# Patient Record
Sex: Male | Born: 1955 | Race: White | Hispanic: No | Smoking: Current every day smoker
Health system: Southern US, Community
[De-identification: ages and names within clinical notes are randomized; demographics above are authoritative.]

## PROBLEM LIST (undated history)

## (undated) DIAGNOSIS — C4491 Basal cell carcinoma of skin, unspecified: Secondary | ICD-10-CM

## (undated) DIAGNOSIS — Z8669 Personal history of other diseases of the nervous system and sense organs: Secondary | ICD-10-CM

## (undated) DIAGNOSIS — I219 Acute myocardial infarction, unspecified: Secondary | ICD-10-CM

## (undated) DIAGNOSIS — I5020 Unspecified systolic (congestive) heart failure: Secondary | ICD-10-CM

## (undated) DIAGNOSIS — F17200 Nicotine dependence, unspecified, uncomplicated: Secondary | ICD-10-CM

## (undated) DIAGNOSIS — K219 Gastro-esophageal reflux disease without esophagitis: Secondary | ICD-10-CM

## (undated) DIAGNOSIS — I2510 Atherosclerotic heart disease of native coronary artery without angina pectoris: Secondary | ICD-10-CM

## (undated) DIAGNOSIS — K4090 Unilateral inguinal hernia, without obstruction or gangrene, not specified as recurrent: Secondary | ICD-10-CM

## (undated) DIAGNOSIS — E785 Hyperlipidemia, unspecified: Secondary | ICD-10-CM

## (undated) DIAGNOSIS — Z951 Presence of aortocoronary bypass graft: Secondary | ICD-10-CM

## (undated) DIAGNOSIS — E782 Mixed hyperlipidemia: Secondary | ICD-10-CM

## (undated) DIAGNOSIS — I519 Heart disease, unspecified: Secondary | ICD-10-CM

## (undated) DIAGNOSIS — M1990 Unspecified osteoarthritis, unspecified site: Secondary | ICD-10-CM

## (undated) DIAGNOSIS — Z85828 Personal history of other malignant neoplasm of skin: Secondary | ICD-10-CM

## (undated) DIAGNOSIS — Z8619 Personal history of other infectious and parasitic diseases: Secondary | ICD-10-CM

## (undated) DIAGNOSIS — I10 Essential (primary) hypertension: Secondary | ICD-10-CM

## (undated) HISTORY — DX: Gastro-esophageal reflux disease without esophagitis: K21.9

## (undated) HISTORY — DX: Acute myocardial infarction, unspecified: I21.9

## (undated) HISTORY — DX: Heart disease, unspecified: I51.9

## (undated) HISTORY — DX: Basal cell carcinoma of skin, unspecified: C44.91

## (undated) HISTORY — DX: Presence of aortocoronary bypass graft: Z95.1

## (undated) HISTORY — PX: CORONARY ARTERY BYPASS GRAFT: SHX141

## (undated) HISTORY — DX: Personal history of other infectious and parasitic diseases: Z86.19

## (undated) HISTORY — PX: LOBECTOMY: SHX5089

## (undated) HISTORY — DX: Unspecified osteoarthritis, unspecified site: M19.90

## (undated) HISTORY — DX: Personal history of other diseases of the nervous system and sense organs: Z86.69

## (undated) HISTORY — DX: Hyperlipidemia, unspecified: E78.5

## (undated) HISTORY — PX: OTHER SURGICAL HISTORY: SHX169

## (undated) HISTORY — DX: Unilateral inguinal hernia, without obstruction or gangrene, not specified as recurrent: K40.90

## (undated) HISTORY — PX: SKIN CANCER EXCISION: SHX779

---
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
## 2006-04-20 HISTORY — PX: INGUINAL HERNIA REPAIR: SUR1180

## 2018-06-08 ENCOUNTER — Encounter (INDEPENDENT_AMBULATORY_CARE_PROVIDER_SITE_OTHER): Payer: Self-pay

## 2018-06-08 ENCOUNTER — Encounter: Payer: Self-pay | Admitting: Family Medicine

## 2018-06-08 ENCOUNTER — Ambulatory Visit (INDEPENDENT_AMBULATORY_CARE_PROVIDER_SITE_OTHER): Payer: Medicare HMO | Admitting: Family Medicine

## 2018-06-08 VITALS — BP 118/72 | HR 61 | Temp 98.4°F | Resp 12 | Ht 67.25 in | Wt 207.2 lb

## 2018-06-08 DIAGNOSIS — I10 Essential (primary) hypertension: Secondary | ICD-10-CM

## 2018-06-08 DIAGNOSIS — Z1211 Encounter for screening for malignant neoplasm of colon: Secondary | ICD-10-CM | POA: Diagnosis not present

## 2018-06-08 DIAGNOSIS — Z85828 Personal history of other malignant neoplasm of skin: Secondary | ICD-10-CM

## 2018-06-08 DIAGNOSIS — E785 Hyperlipidemia, unspecified: Secondary | ICD-10-CM | POA: Insufficient documentation

## 2018-06-08 DIAGNOSIS — Z1159 Encounter for screening for other viral diseases: Secondary | ICD-10-CM | POA: Diagnosis not present

## 2018-06-08 DIAGNOSIS — F17200 Nicotine dependence, unspecified, uncomplicated: Secondary | ICD-10-CM

## 2018-06-08 DIAGNOSIS — Z23 Encounter for immunization: Secondary | ICD-10-CM | POA: Diagnosis not present

## 2018-06-08 DIAGNOSIS — Z951 Presence of aortocoronary bypass graft: Secondary | ICD-10-CM

## 2018-06-08 DIAGNOSIS — E782 Mixed hyperlipidemia: Secondary | ICD-10-CM

## 2018-06-08 DIAGNOSIS — F17201 Nicotine dependence, unspecified, in remission: Secondary | ICD-10-CM | POA: Insufficient documentation

## 2018-06-08 DIAGNOSIS — K219 Gastro-esophageal reflux disease without esophagitis: Secondary | ICD-10-CM

## 2018-06-08 DIAGNOSIS — R69 Illness, unspecified: Secondary | ICD-10-CM | POA: Diagnosis not present

## 2018-06-08 HISTORY — DX: Gastro-esophageal reflux disease without esophagitis: K21.9

## 2018-06-08 HISTORY — DX: Nicotine dependence, unspecified, uncomplicated: F17.200

## 2018-06-08 HISTORY — DX: Mixed hyperlipidemia: E78.2

## 2018-06-08 HISTORY — DX: Essential (primary) hypertension: I10

## 2018-06-08 HISTORY — DX: Personal history of other malignant neoplasm of skin: Z85.828

## 2018-06-08 MED ORDER — PRAVASTATIN SODIUM 40 MG PO TABS: 40.0000 mg | ORAL_TABLET | Freq: Every day | ORAL | 3 refills | Status: DC

## 2018-06-08 MED ORDER — OMEPRAZOLE 20 MG PO CPDR: 20.0000 mg | DELAYED_RELEASE_CAPSULE | Freq: Every day | ORAL | 1 refills | Status: DC

## 2018-06-08 MED ORDER — AMLODIPINE BESYLATE 5 MG PO TABS: 5.0000 mg | ORAL_TABLET | Freq: Every day | ORAL | 3 refills | Status: DC

## 2018-06-08 MED ORDER — TETANUS-DIPHTH-ACELL PERTUSSIS 5-2-15.5 LF-MCG/0.5 IM SUSP: 0.5000 mL | Freq: Once | INTRAMUSCULAR | 0 refills | Status: AC

## 2018-06-08 MED ORDER — CLOPIDOGREL BISULFATE 75 MG PO TABS: 75.0000 mg | ORAL_TABLET | Freq: Every day | ORAL | 1 refills | Status: DC

## 2018-06-08 MED ORDER — METOPROLOL TARTRATE 25 MG PO TABS: 25.0000 mg | ORAL_TABLET | Freq: Two times a day (BID) | ORAL | 3 refills | Status: DC

## 2018-06-08 MED ORDER — NITROGLYCERIN 0.4 MG SL SUBL: 0.4000 mg | SUBLINGUAL_TABLET | SUBLINGUAL | 0 refills | Status: DC | PRN

## 2018-06-08 NOTE — Assessment & Plan Note (Signed)
Will trial PPI to see if that improves symptoms. Given smoking/drinking/duration of symptoms if no improvement may need GI referral to further evaluate. Inconsistent dysphagia is somewhat reassuring and no weight loss.

## 2018-06-08 NOTE — Patient Instructions (Addendum)
#   Screening needs - colon cancer screening - referral  - hepatitis C screening  #Labs  - check with insurance for coverage - will get liver, kidney, electrolytes, lipids, blood counts  #Referrals - to dermatology and cardiology  #Belching - stop the Tagamet - start omeprazole daily - take on an empty stomach and then eat within 30-60 minutes

## 2018-06-08 NOTE — Assessment & Plan Note (Signed)
Reports 2 prior BCC removed and now with new lesion. Exam deferred today as feel he would benefit from full skin check with dermatology and due to time constraints.

## 2018-06-08 NOTE — Assessment & Plan Note (Signed)
Will obtain cardiology records. Cont current medications. BP stable

## 2018-06-08 NOTE — Assessment & Plan Note (Signed)
Not interested in quitting currently. Will continue to address. Anticipate discussion of lung cancer screening and exploring DOE in further detail during next visit. May need spirometry

## 2018-06-08 NOTE — Assessment & Plan Note (Signed)
Records. Cont current meds. Referral for additional management guidence

## 2018-06-08 NOTE — Progress Notes (Signed)
Subjective:     Brandon [name] is a 63 y.o. male presenting for Establish Care (previous PCP in 2005 in [place]. Has seen cardiologist in [facility] No providers in [place] that patient has seen.); Dysphagia (feels his food is not going down well when he eats and has been going on for a while. He does get some chest tightness with laying down but when he sits up and belches symptoms go away.); Skin check (has history of skin cancer and wants referral for dermatology Dr. Wilhemina Bonito with [facility]); and Heart Problem (patient needs cardiology referral to follow- Would like to see Larae Grooms)      HPI   Was waiting for medicare to start to get medication refilled  #Dysphagia - food not going down well - has been going on and off for food not going down, occasionally happens - belching and getting up in the night has been going since 2014  - occasional chest tightness when he lays down - belching improves symptoms and sitting improves symptoms - thinks it may be a hiatal hernia - feels like food is getting stuck and slowly moving through - hx of smoking, current smoking - used nitroglycerin a few times w/o improvement - told to double up on Tagamet by his cardiologist - symptoms primarily when laying down - no unexpected weight loss - no work-up due to insurance issues and poor follow-up   #Skin cancer - would like dermatology referral - 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   HENT: Positive for trouble swallowing. Negative for congestion.   Eyes: Negative.   Respiratory: Positive for shortness of breath. Negative for cough.   Cardiovascular: Negative for  chest pain, palpitations and leg swelling.  Gastrointestinal: Positive for abdominal pain. Negative for nausea and vomiting.  Endocrine: Negative.   Genitourinary: Negative.   Musculoskeletal: Negative.   Skin:       New skin lesion on leg  Allergic/Immunologic: Negative.   Neurological: Negative for headaches.  Hematological: Negative.   Psychiatric/Behavioral: Negative.      Social History   Tobacco Use  Smoking Status Current Every Day Smoker  . Packs/day: 1.50  . Years: 40.00  . Pack years: 60.00  . Types: Cigarettes  Smokeless Tobacco Former Systems developer  . Types: Chew  . Quit date: 04/21/2003        Objective:    BP Readings from Last 3 Encounters:  06/08/18 118/72   Wt Readings from Last 3 Encounters:  06/08/18 207 lb 4 oz (94 kg)    BP 118/72   Pulse 61   Temp 98.4 F (36.9 C)   Resp 12   Ht 5' 7.25" (1.708 m)   Wt 207 lb 4 oz (94 kg)   SpO2 94%   BMI 32.22 kg/m    Physical Exam Constitutional:      Appearance: Normal appearance. He is not ill-appearing or diaphoretic.  HENT:     Right Ear: External ear normal.     Left Ear: External ear normal.     Nose: Nose normal.  Eyes:     General: No scleral icterus.    Extraocular Movements: Extraocular movements intact.  Conjunctiva/sclera: Conjunctivae normal.  Neck:     Musculoskeletal: Neck supple.  Cardiovascular:     Rate and Rhythm: Normal rate and regular rhythm.     Heart sounds: No murmur.  Pulmonary:     Effort: Pulmonary effort is normal. No respiratory distress.     Breath sounds: Normal breath sounds. No wheezing.  Abdominal:     General: Abdomen is flat. Bowel sounds are normal. There is distension (air filled).     Palpations: Abdomen is soft. There is no mass.     Tenderness: There is no abdominal tenderness. There is no guarding or rebound.     Hernia: No hernia is present.  Skin:    General: Skin is warm and dry.  Neurological:     Mental Status: He is alert. Mental status is at  baseline.  Psychiatric:        Mood and Affect: Mood normal.           Assessment & Plan:   Problem List Items Addressed This Visit      Cardiovascular and Mediastinum   Essential hypertension    Will obtain cardiology records. Cont current medications. BP stable      Relevant Medications   metoprolol tartrate (LOPRESSOR) 25 MG tablet   amLODipine (NORVASC) 5 MG tablet   pravastatin (PRAVACHOL) 40 MG tablet   nitroGLYCERIN (NITROSTAT) 0.4 MG SL tablet   aspirin EC 81 MG tablet   Other Relevant Orders   Comprehensive metabolic panel   CBC   Ambulatory referral to Cardiology     Digestive   Gastroesophageal reflux disease - Primary    Will trial PPI to see if that improves symptoms. Given smoking/drinking/duration of symptoms if no improvement may need GI referral to further evaluate. Inconsistent dysphagia is somewhat reassuring and no weight loss.       Relevant Medications   omeprazole (PRILOSEC) 20 MG capsule     Musculoskeletal and Integument   Hx of basal cell carcinoma    Reports 2 prior BCC removed and now with new lesion. Exam deferred today as feel he would benefit from full skin check with dermatology and due to time constraints.       Relevant Medications   aspirin EC 81 MG tablet   Other Relevant Orders   Ambulatory referral to Dermatology     Other   Mixed hyperlipidemia   Relevant Medications   metoprolol tartrate (LOPRESSOR) 25 MG tablet   amLODipine (NORVASC) 5 MG tablet   pravastatin (PRAVACHOL) 40 MG tablet   nitroGLYCERIN (NITROSTAT) 0.4 MG SL tablet   aspirin EC 81 MG tablet   Other Relevant Orders   Comprehensive metabolic panel   Lipid panel   Ambulatory referral to Cardiology   Hx of CABG    Records. Cont current meds. Referral for additional management guidence      Relevant Medications   metoprolol tartrate (LOPRESSOR) 25 MG tablet   amLODipine (NORVASC) 5 MG tablet   pravastatin (PRAVACHOL) 40 MG tablet   nitroGLYCERIN  (NITROSTAT) 0.4 MG SL tablet   clopidogrel (PLAVIX) 75 MG tablet   Other Relevant Orders   Comprehensive metabolic panel   CBC   Ambulatory referral to Cardiology   Tobacco use disorder    Not interested in quitting currently. Will continue to address. Anticipate discussion of lung cancer screening and exploring DOE in further detail during next visit. May need spirometry       Other Visit Diagnoses    Colon cancer screening  Relevant Orders   Ambulatory referral to Gastroenterology   Need for hepatitis C screening test       Relevant Orders   Hepatitis C antibody       Return in about 4 weeks (around 07/06/2018) for reflux symptoms.  Lesleigh Noe, MD

## 2018-06-09 ENCOUNTER — Telehealth: Payer: Self-pay | Admitting: Family Medicine

## 2018-06-09 DIAGNOSIS — Z951 Presence of aortocoronary bypass graft: Secondary | ICD-10-CM

## 2018-06-09 DIAGNOSIS — E782 Mixed hyperlipidemia: Secondary | ICD-10-CM

## 2018-06-09 DIAGNOSIS — I10 Essential (primary) hypertension: Secondary | ICD-10-CM

## 2018-06-09 MED ORDER — PRAVASTATIN SODIUM 40 MG PO TABS: 40.0000 mg | ORAL_TABLET | Freq: Every day | ORAL | 3 refills | Status: DC

## 2018-06-09 MED ORDER — CLOPIDOGREL BISULFATE 75 MG PO TABS: 75.0000 mg | ORAL_TABLET | Freq: Every day | ORAL | 1 refills | Status: DC

## 2018-06-09 MED ORDER — METOPROLOL TARTRATE 25 MG PO TABS: 25.0000 mg | ORAL_TABLET | Freq: Two times a day (BID) | ORAL | 3 refills | Status: DC

## 2018-06-09 MED ORDER — NITROGLYCERIN 0.4 MG SL SUBL: 0.4000 mg | SUBLINGUAL_TABLET | SUBLINGUAL | 0 refills | Status: DC | PRN

## 2018-06-09 MED ORDER — OMEPRAZOLE 20 MG PO CPDR: 20.0000 mg | DELAYED_RELEASE_CAPSULE | Freq: Every day | ORAL | 1 refills | Status: DC

## 2018-06-09 MED ORDER — AMLODIPINE BESYLATE 5 MG PO TABS: 5.0000 mg | ORAL_TABLET | Freq: Every day | ORAL | 3 refills | Status: DC

## 2018-06-09 NOTE — Telephone Encounter (Signed)
RXs [facility] [place] for all the medications and patient advised.

## 2018-06-09 NOTE — Telephone Encounter (Signed)
Pt called stating that due to having medicare [facility] [place] will need to be his preferred pharmacy. [facility].

## 2018-06-10 ENCOUNTER — Telehealth: Payer: Self-pay | Admitting: Interventional Cardiology

## 2018-06-10 NOTE — Telephone Encounter (Signed)
Recv'd records from [place] forwarded 12 pages to Dr. Casandra Doffing

## 2018-06-14 ENCOUNTER — Encounter: Payer: Self-pay | Admitting: Family Medicine

## 2018-06-14 DIAGNOSIS — I5020 Unspecified systolic (congestive) heart failure: Secondary | ICD-10-CM | POA: Insufficient documentation

## 2018-06-14 DIAGNOSIS — I2510 Atherosclerotic heart disease of native coronary artery without angina pectoris: Secondary | ICD-10-CM

## 2018-06-14 HISTORY — DX: Unspecified systolic (congestive) heart failure: I50.20

## 2018-06-14 HISTORY — DX: Atherosclerotic heart disease of native coronary artery without angina pectoris: I25.10

## 2018-06-15 ENCOUNTER — Encounter: Payer: Self-pay | Admitting: Gastroenterology

## 2018-07-01 ENCOUNTER — Telehealth: Payer: Self-pay | Admitting: Family Medicine

## 2018-07-01 NOTE — Telephone Encounter (Signed)
Agree with postponing. Return if no improvement. Otherwise would recommend continuing PPI prescribed in office visit for a total of 8-12 weeks and then can stop if symptoms improved. If no improvement would recommend follow-up once it is safer to come to clinic.

## 2018-07-01 NOTE — Telephone Encounter (Signed)
Patient advised.

## 2018-07-01 NOTE — Telephone Encounter (Signed)
Pt called to postpone 4 wk follow up due to virus concerns. I advised him we are taking precautions but understand his hesitation. He said he will call back to r/s next week but asked for a call back if you do not agree with the cancellation. He is aware he may need to follow up before further refills.

## 2018-07-06 ENCOUNTER — Ambulatory Visit: Payer: Medicare HMO | Admitting: Family Medicine

## 2018-07-13 ENCOUNTER — Ambulatory Visit: Payer: Medicare HMO | Admitting: Gastroenterology

## 2018-08-04 ENCOUNTER — Ambulatory Visit: Payer: Medicare HMO | Admitting: Interventional Cardiology

## 2018-08-14 ENCOUNTER — Other Ambulatory Visit: Payer: Self-pay | Admitting: Family Medicine

## 2018-08-16 ENCOUNTER — Ambulatory Visit: Payer: Medicare HMO | Admitting: Gastroenterology

## 2018-09-03 DIAGNOSIS — S60552A Superficial foreign body of left hand, initial encounter: Secondary | ICD-10-CM | POA: Diagnosis not present

## 2018-09-03 DIAGNOSIS — X58XXXA Exposure to other specified factors, initial encounter: Secondary | ICD-10-CM | POA: Diagnosis not present

## 2018-09-03 DIAGNOSIS — G8911 Acute pain due to trauma: Secondary | ICD-10-CM | POA: Diagnosis not present

## 2018-09-03 DIAGNOSIS — S61442A Puncture wound with foreign body of left hand, initial encounter: Secondary | ICD-10-CM | POA: Diagnosis not present

## 2018-10-12 ENCOUNTER — Other Ambulatory Visit: Payer: Self-pay | Admitting: Family Medicine

## 2018-10-12 NOTE — Telephone Encounter (Signed)
Spoke with patient to see if he was still taking Omeprazole. He takes it daily and "loves it". Symptoms have improved, pretty much resolved. Ok to refill? Need follow up appointment?

## 2018-10-17 ENCOUNTER — Other Ambulatory Visit: Payer: Self-pay

## 2018-10-17 DIAGNOSIS — Z951 Presence of aortocoronary bypass graft: Secondary | ICD-10-CM

## 2018-10-17 NOTE — Telephone Encounter (Signed)
Seems appropriate to continue plavix until cardiology visit.   Please clarify what the issue is - I believe I received a notification from the pharmacy that he was not taking (likely due to not refilling appropriate). Please clarify how often he is taking the medication

## 2018-10-17 NOTE — Telephone Encounter (Signed)
Pt left v/m that they are wanting to stop plavix. Pt has been taking Plavix for 15 years and pt does not want to stop plavix until sees cardiology in August 2020.Please advise. I tried to call pt to see who they are that wants to stop plavix;unable to reach pt by phone. Last filled # 30 x 1 on 06/09/18.

## 2018-10-18 NOTE — Telephone Encounter (Signed)
Called both numbers in the chart and left a message to call back

## 2018-10-20 MED ORDER — CLOPIDOGREL BISULFATE 75 MG PO TABS: 75.0000 mg | ORAL_TABLET | Freq: Every day | ORAL | 1 refills | Status: DC

## 2018-10-20 NOTE — Telephone Encounter (Signed)
Spoke with patient. He said that the last Plavix bottle he received said 0 refills and he thought that meant Dr. Einar Pheasant did not want him to take it anymore. He said during last visit Dr. Einar Pheasant wanted to have patient come off this medication possibly. He gets 90 day supply for his medications and would like to get a refill on this for that amount please. Advised patient that Dr. Einar Pheasant said to continue medication unless cardiologist tells him other wise in August.

## 2018-10-20 NOTE — Telephone Encounter (Signed)
Called both numbers in the chart and patient's sister, ok per DPR on file and left a message for patient to call back

## 2018-10-20 NOTE — Addendum Note (Signed)
Addended by: Kris Mouton on: 10/20/2018 10:03 AM   Modules accepted: Orders

## 2018-11-24 DIAGNOSIS — C44719 Basal cell carcinoma of skin of left lower limb, including hip: Secondary | ICD-10-CM | POA: Diagnosis not present

## 2018-11-24 DIAGNOSIS — D1722 Benign lipomatous neoplasm of skin and subcutaneous tissue of left arm: Secondary | ICD-10-CM | POA: Diagnosis not present

## 2018-11-24 DIAGNOSIS — D225 Melanocytic nevi of trunk: Secondary | ICD-10-CM | POA: Diagnosis not present

## 2018-11-24 DIAGNOSIS — L814 Other melanin hyperpigmentation: Secondary | ICD-10-CM | POA: Diagnosis not present

## 2018-11-24 DIAGNOSIS — D485 Neoplasm of uncertain behavior of skin: Secondary | ICD-10-CM | POA: Diagnosis not present

## 2018-11-24 DIAGNOSIS — D1801 Hemangioma of skin and subcutaneous tissue: Secondary | ICD-10-CM | POA: Diagnosis not present

## 2018-11-24 DIAGNOSIS — L821 Other seborrheic keratosis: Secondary | ICD-10-CM | POA: Diagnosis not present

## 2018-12-08 ENCOUNTER — Other Ambulatory Visit: Payer: Self-pay | Admitting: Family Medicine

## 2018-12-13 ENCOUNTER — Ambulatory Visit (INDEPENDENT_AMBULATORY_CARE_PROVIDER_SITE_OTHER): Payer: Medicare HMO | Admitting: Interventional Cardiology

## 2018-12-13 ENCOUNTER — Other Ambulatory Visit: Payer: Self-pay

## 2018-12-13 ENCOUNTER — Encounter: Payer: Self-pay | Admitting: Interventional Cardiology

## 2018-12-13 VITALS — BP 116/70 | HR 72 | Ht 67.25 in | Wt 206.4 lb

## 2018-12-13 DIAGNOSIS — E782 Mixed hyperlipidemia: Secondary | ICD-10-CM

## 2018-12-13 DIAGNOSIS — I25118 Atherosclerotic heart disease of native coronary artery with other forms of angina pectoris: Secondary | ICD-10-CM

## 2018-12-13 DIAGNOSIS — I10 Essential (primary) hypertension: Secondary | ICD-10-CM

## 2018-12-13 DIAGNOSIS — Z951 Presence of aortocoronary bypass graft: Secondary | ICD-10-CM | POA: Diagnosis not present

## 2018-12-13 MED ORDER — METOPROLOL TARTRATE 25 MG PO TABS: 25.0000 mg | ORAL_TABLET | Freq: Two times a day (BID) | ORAL | 3 refills | Status: DC

## 2018-12-13 MED ORDER — NITROGLYCERIN 0.4 MG SL SUBL: 0.4000 mg | SUBLINGUAL_TABLET | SUBLINGUAL | 3 refills | Status: DC | PRN

## 2018-12-13 MED ORDER — PRAVASTATIN SODIUM 40 MG PO TABS: 40.0000 mg | ORAL_TABLET | Freq: Every day | ORAL | 3 refills | Status: DC

## 2018-12-13 MED ORDER — AMLODIPINE BESYLATE 5 MG PO TABS: 5.0000 mg | ORAL_TABLET | Freq: Every day | ORAL | 3 refills | Status: DC

## 2018-12-13 MED ORDER — CLOPIDOGREL BISULFATE 75 MG PO TABS: 75.0000 mg | ORAL_TABLET | Freq: Every day | ORAL | 3 refills | Status: DC

## 2018-12-13 MED ORDER — ASPIRIN EC 81 MG PO TBEC: 81.0000 mg | DELAYED_RELEASE_TABLET | Freq: Every day | ORAL | 3 refills | Status: DC

## 2018-12-13 NOTE — Patient Instructions (Signed)
Medication Instructions:  Your physician recommends that you continue on your current medications as directed. Please refer to the Current Medication list given to you today.  If you need a refill on your cardiac medications before your next appointment, please call your pharmacy.   Lab work: Your physician recommends that you return for FASTING lab work  (LIPIDS, CMET, CBC)   If you have labs (blood work) drawn today and your tests are completely normal, you will receive your results only by: Marland Kitchen MyChart Message (if you have MyChart) OR . A paper copy in the mail If you have any lab test that is abnormal or we need to change your treatment, we will call you to review the results.  Testing/Procedures: Your physician has requested that you have an abdominal aorta duplex. During this test, an ultrasound is used to evaluate the aorta. Allow 30 minutes for this exam. Do not eat after midnight the day before and avoid carbonated beverages  Follow-Up: At [facility], you and your health needs are our priority.  As part of our continuing mission to provide you with exceptional heart care, we have created designated Provider Care Teams.  These Care Teams include your primary Cardiologist (physician) and Advanced Practice Providers (APPs -  Physician Assistants and Nurse Practitioners) who all work together to provide you with the care you need, when you need it. . You will need a follow up appointment in 1 year.  Please call our office 2 months in advance to schedule this appointment.  You may see Casandra Doffing, MD or one of the following Advanced Practice Providers on your designated Care Team:   . Lyda Jester, PA-C . Dayna Dunn, PA-C . Ermalinda Barrios, PA-C  Any Other Special Instructions Will Be Listed Below (If Applicable).   Heart-Healthy Eating Plan Heart-healthy meal planning includes:  Eating less unhealthy fats.  Eating more healthy fats.  Making other changes in your diet.  Talk with your doctor or a diet specialist (dietitian) to create an eating plan that is right for you. What is my plan? Your doctor may recommend an eating plan that includes:  Total fat: ______% or less of total calories a day.  Saturated fat: ______% or less of total calories a day.  Cholesterol: less than _________mg a day. What are tips for following this plan? Cooking Avoid frying your food. Try to bake, boil, grill, or broil it instead. You can also reduce fat by:  Removing the skin from poultry.  Removing all visible fats from meats.  Steaming vegetables in water or broth. Meal planning   At meals, divide your plate into four equal parts: ? Fill one-half of your plate with vegetables and green salads. ? Fill one-fourth of your plate with whole grains. ? Fill one-fourth of your plate with lean protein foods.  Eat 4-5 servings of vegetables per day. A serving of vegetables is: ? 1 cup of raw or cooked vegetables. ? 2 cups of raw leafy greens.  Eat 4-5 servings of fruit per day. A serving of fruit is: ? 1 medium whole fruit. ?  cup of dried fruit. ?  cup of fresh, frozen, or canned fruit. ?  cup of 100% fruit juice.  Eat more foods that have soluble fiber. These are apples, broccoli, carrots, beans, peas, and barley. Try to get 20-30 g of fiber per day.  Eat 4-5 servings of nuts, legumes, and seeds per week: ? 1 serving of dried beans or legumes equals  cup  after being cooked. ? 1 serving of nuts is  cup. ? 1 serving of seeds equals 1 tablespoon. General information  Eat more home-cooked food. Eat less restaurant, buffet, and fast food.  Limit or avoid alcohol.  Limit foods that are high in starch and sugar.  Avoid fried foods.  Lose weight if you are overweight.  Keep track of how much salt (sodium) you eat. This is important if you have high blood pressure. Ask your doctor to tell you more about this.  Try to add vegetarian meals each week. Fats   Choose healthy fats. These include olive oil and canola oil, flaxseeds, walnuts, almonds, and seeds.  Eat more omega-3 fats. These include salmon, mackerel, sardines, tuna, flaxseed oil, and ground flaxseeds. Try to eat fish at least 2 times each week.  Check food labels. Avoid foods with trans fats or high amounts of saturated fat.  Limit saturated fats. ? These are often found in animal products, such as meats, butter, and cream. ? These are also found in plant foods, such as palm oil, palm kernel oil, and coconut oil.  Avoid foods with partially hydrogenated oils in them. These have trans fats. Examples are stick margarine, some tub margarines, cookies, crackers, and other baked goods. What foods can I eat? Fruits All fresh, canned (in natural juice), or frozen fruits. Vegetables Fresh or frozen vegetables (raw, steamed, roasted, or grilled). Green salads. Grains Most grains. Choose whole wheat and whole grains most of the time. Rice and pasta, including brown rice and pastas made with whole wheat. Meats and other proteins Lean, well-trimmed beef, veal, pork, and lamb. Chicken and [place] without skin. All fish and shellfish. Wild duck, rabbit, pheasant, and venison. Egg whites or low-cholesterol egg substitutes. Dried beans, peas, lentils, and tofu. Seeds and most nuts. Dairy Low-fat or nonfat cheeses, including ricotta and mozzarella. Skim or 1% milk that is liquid, powdered, or evaporated. Buttermilk that is made with low-fat milk. Nonfat or low-fat yogurt. Fats and oils Non-hydrogenated (trans-free) margarines. Vegetable oils, including soybean, sesame, sunflower, olive, peanut, safflower, corn, canola, and cottonseed. Salad dressings or mayonnaise made with a vegetable oil. Beverages Mineral water. Coffee and tea. Diet carbonated beverages. Sweets and desserts Sherbet, gelatin, and fruit ice. Small amounts of dark chocolate. Limit all sweets and desserts. Seasonings and condiments  All seasonings and condiments. The items listed above may not be a complete list of foods and drinks you can eat. Contact a dietitian for more options. What foods should I avoid? Fruits Canned fruit in heavy syrup. Fruit in cream or butter sauce. Fried fruit. Limit coconut. Vegetables Vegetables cooked in cheese, cream, or butter sauce. Fried vegetables. Grains Breads that are made with saturated or trans fats, oils, or whole milk. Croissants. Sweet rolls. Donuts. High-fat crackers, such as cheese crackers. Meats and other proteins Fatty meats, such as hot dogs, ribs, sausage, bacon, rib-eye roast or steak. High-fat deli meats, such as salami and bologna. Caviar. Domestic duck and goose. Organ meats, such as liver. Dairy Cream, sour cream, cream cheese, and creamed cottage cheese. Whole-milk cheeses. Whole or 2% milk that is liquid, evaporated, or condensed. Whole buttermilk. Cream sauce or high-fat cheese sauce. Yogurt that is made from whole milk. Fats and oils Meat fat, or shortening. Cocoa butter, hydrogenated oils, palm oil, coconut oil, palm kernel oil. Solid fats and shortenings, including bacon fat, salt pork, lard, and butter. Nondairy cream substitutes. Salad dressings with cheese or sour cream. Beverages Regular sodas and juice drinks  with added sugar. Sweets and desserts Frosting. Pudding. Cookies. Cakes. Pies. Milk chocolate or white chocolate. Buttered syrups. Full-fat ice cream or ice cream drinks. The items listed above may not be a complete list of foods and drinks to avoid. Contact a dietitian for more information. Summary  Heart-healthy meal planning includes eating less unhealthy fats, eating more healthy fats, and making other changes in your diet.  Eat a balanced diet. This includes fruits and vegetables, low-fat or nonfat dairy, lean protein, nuts and legumes, whole grains, and heart-healthy oils and fats. This information is not intended to replace advice given to  you by your health care provider. Make sure you discuss any questions you have with your health care provider. Document Released: 10/06/2011 Document Revised: 06/10/2017 Document Reviewed: 05/14/2017 Elsevier Patient Education  2020 [employer].

## 2018-12-13 NOTE — Progress Notes (Signed)
Cardiology Office Note   Date:  12/13/2018   ID:  Brandon [name], DOB February 01, 1956, MRN [id number]  PCP:  Lesleigh Noe, MD    No chief complaint on file.  CAD  Wt Readings from Last 3 Encounters:  12/13/18 206 lb 6.4 oz (93.6 kg)  06/08/18 207 lb 4 oz (94 kg)       History of Present Illness: Brandon [name] is a 63 y.o. male who is being seen today for the evaluation of hypertension, CAD at the request of Lesleigh Noe, MD.  In 2000, angina was back pain but then went to this left arm.  He had an MI at that time and later had CABG.  CAD history: CABG x 3 in 2000, [place] , [place]. [facility] 2005: Stent at [facility].  Angina felt like intermittent back pain.  2015: Stent at [facility], 2.25 x 16 Promus in Radial to RCA.  He has been on Plavix in the past. His PMD tried to stop his Plavix, but he preferred to stay on it.  Now lives in [place], [place].    Denies : Chest pain. Dizziness. Leg edema. Nitroglycerin use. Orthopnea. Palpitations. Paroxysmal nocturnal dyspnea. Shortness of breath. Syncope.   He has not been walking a lot. He continues to smoke.  COVID has limited his exercise and promoted smoking. He wants to try OTC smoking cessation tools.    He is unemployed.  He is not following a particularly healthy diet.      Past Medical History:  Diagnosis Date  . Arthritis   . Basal cell carcinoma   . CAD (coronary artery disease) 06/14/2018   Cath on 01/23/2014 with LAD occlusion  . Essential hypertension 06/08/2018  . Gastroesophageal reflux disease 06/08/2018  . GERD (gastroesophageal reflux disease)   . Heart disease   . Heart failure with reduced ejection fraction ([facility]) 06/14/2018   ECHO 01/22/2014 with EF 40-45%  . History of chicken pox   . History of migraine   . Hx of basal cell carcinoma 06/08/2018   04/2017. Left jaw  . Hx of CABG   . Hyperlipidemia   . Inguinal hernia   . Mixed hyperlipidemia 06/08/2018  . Tobacco use disorder 06/08/2018    Past  Surgical History:  Procedure Laterality Date  . APPENDECTOMY  1960  . CORONARY ARTERY BYPASS GRAFT     x 3  . INGUINAL HERNIA REPAIR  2008  . SKIN CANCER EXCISION    . Stent placed     in 2005 and in 2014     Current Outpatient Medications  Medication Sig Dispense Refill  . amLODipine (NORVASC) 5 MG tablet Take 1 tablet (5 mg total) by mouth daily. 90 tablet 3  . clopidogrel (PLAVIX) 75 MG tablet Take 1 tablet (75 mg total) by mouth daily. 90 tablet 3  . metoprolol tartrate (LOPRESSOR) 25 MG tablet Take 1 tablet (25 mg total) by mouth 2 (two) times daily. 180 tablet 3  . Multiple Vitamin (MULTIVITAMIN) tablet Take 1 tablet by mouth daily.    . nitroGLYCERIN (NITROSTAT) 0.4 MG SL tablet Place 1 tablet (0.4 mg total) under the tongue every 5 (five) minutes as needed for chest pain. 25 tablet 3  . omeprazole (PRILOSEC) 20 MG capsule Take 1 capsule (20 mg total) by mouth daily. Please call to make follow up appointment in the fall 90 capsule 0  . pravastatin (PRAVACHOL) 40 MG tablet Take 1 tablet (40 mg total) by mouth daily. [place]  tablet 3  . aspirin EC 81 MG tablet Take 1 tablet (81 mg total) by mouth daily. 90 tablet 3   No current facility-administered medications for this visit.     Allergies:   Antihistamines, loratadine-type; Penicillins; Shellfish allergy; and Iodine    Social History:  The patient  reports that he has been smoking cigarettes. He has a 60.00 pack-year smoking history. He quit smokeless tobacco use about 15 years ago.  His smokeless tobacco use included chew. He reports current alcohol use. He reports current drug use. Drug: Marijuana.   Family History:  The patient's family history includes Colon cancer (age of onset: 10) in his father; Colon polyps in his sister; Dementia in his mother; Diabetes in his father; Heart attack in his maternal grandfather and maternal grandmother; Heart attack (age of onset: 61) in his mother; Heart attack (age of onset: 64) in his  brother; Heart disease in his mother; Prostate cancer in his paternal grandfather; Stroke (age of onset: 54) in his paternal grandmother.    ROS:  Please see the history of present illness.   Otherwise, review of systems are positive for easy bruising.   All other systems are reviewed and negative.    PHYSICAL EXAM: VS:  BP 116/70   Pulse 72   Ht 5' 7.25" (1.708 m)   Wt 206 lb 6.4 oz (93.6 kg)   SpO2 95%   BMI 32.09 kg/m  , BMI Body mass index is 32.09 kg/m. GEN: Well nourished, well developed, in no acute distress  HEENT: normal  Neck: no JVD, carotid bruits, or masses Cardiac: RRR; no murmurs, rubs, or gallops,no edema  Respiratory:  clear to auscultation bilaterally, normal work of breathing GI: soft, nontender, nondistended, + BS MS: no deformity or atrophy  Skin: warm and dry, no rash Neuro:  Strength and sensation are intact Psych: euthymic mood, full affect   EKG:   The ekg ordered today demonstrates NSR, inferior Q waves, no ST segment   Recent Labs: No results found for requested labs within last 8760 hours.   Lipid Panel No results found for: CHOL, TRIG, HDL, CHOLHDL, VLDL, LDLCALC, LDLDIRECT   Other studies Reviewed: Additional studies/ records that were reviewed today with results demonstrating: prior notes reviewed .   ASSESSMENT AND PLAN:  1. CAD: s/p CABG: Decrease aspirin to 81 mg daily.  Continue Plavix.  Two PCI since CABG.  Continue aggressive secondary prevention. 2. HTN: The current medical regimen is effective;  continue present plan and medications. 3. Tobacco abuse: He prefers to try OTC aids to help stop smoking.  Will screen for AAA given smoking history and age.  4. Info re: heart healthy diet was given as well.    Current medicines are reviewed at length with the patient today.  The patient concerns regarding his medicines were addressed.  The following changes have been made:  No change  Labs/ tests ordered today include:   Orders  Placed This Encounter  Procedures  . US AORTA MEDICARE SCREENING  . Lipid panel  . Comprehensive metabolic panel  . CBC  . EKG 12-Lead    Recommend 150 minutes/week of aerobic exercise Low fat, low carb, high fiber diet recommended  Disposition:   FU in 1 year   Signed, Larae Grooms, MD  12/13/2018 2:30 PM    [address] [place] [place], [place], [place]  [address] Phone: [phone]; Fax: [phone]

## 2018-12-21 ENCOUNTER — Other Ambulatory Visit: Payer: Medicare HMO | Admitting: *Deleted

## 2018-12-21 ENCOUNTER — Other Ambulatory Visit: Payer: Self-pay

## 2018-12-21 DIAGNOSIS — I25118 Atherosclerotic heart disease of native coronary artery with other forms of angina pectoris: Secondary | ICD-10-CM | POA: Diagnosis not present

## 2018-12-21 DIAGNOSIS — Z951 Presence of aortocoronary bypass graft: Secondary | ICD-10-CM | POA: Diagnosis not present

## 2018-12-21 DIAGNOSIS — E782 Mixed hyperlipidemia: Secondary | ICD-10-CM | POA: Diagnosis not present

## 2018-12-21 DIAGNOSIS — I10 Essential (primary) hypertension: Secondary | ICD-10-CM | POA: Diagnosis not present

## 2018-12-21 LAB — COMPREHENSIVE METABOLIC PANEL
ALT: 20 IU/L (ref 0–44)
AST: 15 IU/L (ref 0–40)
Albumin/Globulin Ratio: 2.3 — ABNORMAL HIGH (ref 1.2–2.2)
Albumin: 4.3 g/dL (ref 3.8–4.8)
Alkaline Phosphatase: 56 IU/L (ref 39–117)
BUN/Creatinine Ratio: 17 (ref 10–24)
BUN: 15 mg/dL (ref 8–27)
Bilirubin Total: 0.4 mg/dL (ref 0.0–1.2)
CO2: 23 mmol/L (ref 20–29)
Calcium: 9.1 mg/dL (ref 8.6–10.2)
Chloride: 104 mmol/L (ref 96–106)
Creatinine, Ser: 0.9 mg/dL (ref 0.76–1.27)
GFR calc Af Amer: 103 mL/min/{1.73_m2} (ref >59)
GFR calc non Af Amer: 89 mL/min/{1.73_m2} (ref >59)
Globulin, Total: 1.9 g/dL (ref 1.5–4.5)
Glucose: 122 mg/dL — ABNORMAL HIGH (ref 65–99)
Potassium: 4.5 mmol/L (ref 3.5–5.2)
Sodium: 141 mmol/L (ref 134–144)
Total Protein: 6.2 g/dL (ref 6.0–8.5)

## 2018-12-21 LAB — CBC
Hematocrit: 46.6 % (ref 37.5–51.0)
Hemoglobin: 15.7 g/dL (ref 13.0–17.7)
MCH: 30.7 pg (ref 26.6–33.0)
MCHC: 33.7 g/dL (ref 31.5–35.7)
MCV: 91 fL (ref 79–97)
Platelets: 196 10*3/uL (ref 150–450)
RBC: 5.11 x10E6/uL (ref 4.14–5.80)
RDW: 13 % (ref 11.6–15.4)
WBC: 6.4 10*3/uL (ref 3.4–10.8)

## 2018-12-21 LAB — LIPID PANEL
Chol/HDL Ratio: 5.3 ratio — ABNORMAL HIGH (ref 0.0–5.0)
Cholesterol, Total: 190 mg/dL (ref 100–199)
HDL: 36 mg/dL — ABNORMAL LOW (ref >39)
LDL Chol Calc (NIH): 112 mg/dL — ABNORMAL HIGH (ref 0–99)
Triglycerides: 243 mg/dL — ABNORMAL HIGH (ref 0–149)
VLDL Cholesterol Cal: 42 mg/dL — ABNORMAL HIGH (ref 5–40)

## 2018-12-27 ENCOUNTER — Other Ambulatory Visit (HOSPITAL_COMMUNITY): Payer: Medicare HMO

## 2018-12-28 ENCOUNTER — Other Ambulatory Visit: Payer: Self-pay

## 2018-12-28 DIAGNOSIS — E782 Mixed hyperlipidemia: Secondary | ICD-10-CM

## 2018-12-28 MED ORDER — ATORVASTATIN CALCIUM 40 MG PO TABS: 40.0000 mg | ORAL_TABLET | Freq: Every day | ORAL | 3 refills | Status: DC

## 2019-01-05 ENCOUNTER — Other Ambulatory Visit: Payer: Self-pay | Admitting: *Deleted

## 2019-01-05 ENCOUNTER — Telehealth: Payer: Self-pay

## 2019-01-05 DIAGNOSIS — Z20822 Contact with and (suspected) exposure to covid-19: Secondary | ICD-10-CM

## 2019-01-05 DIAGNOSIS — R6889 Other general symptoms and signs: Secondary | ICD-10-CM | POA: Diagnosis not present

## 2019-01-05 NOTE — Telephone Encounter (Signed)
Agreed, thanks, please put him on the follow up list.

## 2019-01-05 NOTE — Telephone Encounter (Signed)
Pt wants to get covid testing mainly to protect family members pt might be around. Pt woke up on 01/04/19 with mild S/T on one side; today pt has scratchy throat on both sides of throat and pt feels like he is going to get a H/A but does not really get a true H/A. Pt has no covid symptoms except slight S/T and slight H/A,no fever. pt traveled to [place] in [place] 12/21/18 -12/27/18; pt did not wear mask at [place] or when in boat; pt was either by himself or with his sister and brother in law and no known exposure to + covid. Pt visits his mom at nursing home every other day thru the door but pt does see other family members. Pt wants covid testing to make sure he is not infecting his family.pt will try to self quarantine until gets covid test back. Pt is going to [facility] main visitor entrance now to get tested. FYI to Dr Damita Dunnings.

## 2019-01-06 LAB — NOVEL CORONAVIRUS, NAA: SARS-CoV-2, NAA: NOT DETECTED

## 2019-01-06 NOTE — Telephone Encounter (Signed)
See incomplete note below.  covid neg, please let me know if there is any outstanding issue.  Thanks.

## 2019-01-06 NOTE — Telephone Encounter (Signed)
Called patient and advised him of results and he verbalized understanding.  Patient stated that he is feeling much better today and does not have a sore throat or headache. Patient stated that he did not think that he had Covid, but wanted to make sure because he helps take care of some of his family members.

## 2019-01-06 NOTE — Telephone Encounter (Signed)
Patient's COVID results are in and negative. Will forward to [employer], LPN to have her contact patient.  FYI to Dr. Damita Dunnings.

## 2019-01-06 NOTE — Telephone Encounter (Signed)
Noted. Thanks.

## 2019-02-15 DIAGNOSIS — R69 Illness, unspecified: Secondary | ICD-10-CM | POA: Diagnosis not present

## 2019-03-06 ENCOUNTER — Other Ambulatory Visit: Payer: Self-pay | Admitting: Family Medicine

## 2019-03-06 NOTE — Telephone Encounter (Signed)
Would recommend follow-up appointment. If symptoms not improved did want to discuss consideration for GI referral.

## 2019-03-06 NOTE — Telephone Encounter (Signed)
Does patient need to follow up at a certain time?

## 2019-03-07 NOTE — Telephone Encounter (Signed)
Spoke with patient. He did not have a calendar with his schedule with him at this time and will call back to schedule. Appointment in December is fine.

## 2019-03-29 ENCOUNTER — Other Ambulatory Visit: Payer: Self-pay

## 2019-03-29 ENCOUNTER — Other Ambulatory Visit: Payer: Medicare HMO | Admitting: *Deleted

## 2019-03-29 ENCOUNTER — Encounter (INDEPENDENT_AMBULATORY_CARE_PROVIDER_SITE_OTHER): Payer: Self-pay

## 2019-03-29 DIAGNOSIS — E782 Mixed hyperlipidemia: Secondary | ICD-10-CM | POA: Diagnosis not present

## 2019-03-29 LAB — HEPATIC FUNCTION PANEL
ALT: 24 IU/L (ref 0–44)
AST: 17 IU/L (ref 0–40)
Albumin: 4.6 g/dL (ref 3.8–4.8)
Alkaline Phosphatase: 63 IU/L (ref 39–117)
Bilirubin Total: 0.7 mg/dL (ref 0.0–1.2)
Bilirubin, Direct: 0.16 mg/dL (ref 0.00–0.40)
Total Protein: 6.6 g/dL (ref 6.0–8.5)

## 2019-03-29 LAB — LIPID PANEL
Chol/HDL Ratio: 4.5 ratio (ref 0.0–5.0)
Cholesterol, Total: 159 mg/dL (ref 100–199)
HDL: 35 mg/dL — ABNORMAL LOW (ref >39)
LDL Chol Calc (NIH): 78 mg/dL (ref 0–99)
Triglycerides: 280 mg/dL — ABNORMAL HIGH (ref 0–149)
VLDL Cholesterol Cal: 46 mg/dL — ABNORMAL HIGH (ref 5–40)

## 2019-04-03 ENCOUNTER — Telehealth: Payer: Self-pay | Admitting: Interventional Cardiology

## 2019-04-03 ENCOUNTER — Other Ambulatory Visit: Payer: Self-pay

## 2019-04-03 DIAGNOSIS — E782 Mixed hyperlipidemia: Secondary | ICD-10-CM

## 2019-04-03 NOTE — Telephone Encounter (Signed)
The patient has been notified of the result and recommendations. Fasting labs scheduled for 10/02/19. Patient verbalized understanding.  All questions (if any) were answered. Cleon Gustin, RN 04/03/2019 3:19 PM

## 2019-04-03 NOTE — Telephone Encounter (Signed)
-----   Message from Jettie Booze, MD sent at 03/31/2019  6:41 PM EST ----- LDL better.  TG elevated.  Increase exercise and try to eat healthy.  Increase fiber in diet.  Repeat lipids and liver in 6 months.

## 2019-04-03 NOTE — Telephone Encounter (Signed)
Follow Up:; ° ° °Returning your call. °

## 2019-04-17 ENCOUNTER — Encounter: Payer: Self-pay | Admitting: Family Medicine

## 2019-04-17 ENCOUNTER — Ambulatory Visit: Payer: Medicare HMO | Admitting: Family Medicine

## 2019-04-17 ENCOUNTER — Telehealth: Payer: Self-pay

## 2019-04-17 ENCOUNTER — Ambulatory Visit (INDEPENDENT_AMBULATORY_CARE_PROVIDER_SITE_OTHER): Payer: Medicare HMO | Admitting: Family Medicine

## 2019-04-17 VITALS — Temp 99.8°F

## 2019-04-17 DIAGNOSIS — Z20828 Contact with and (suspected) exposure to other viral communicable diseases: Secondary | ICD-10-CM

## 2019-04-17 DIAGNOSIS — R197 Diarrhea, unspecified: Secondary | ICD-10-CM | POA: Diagnosis not present

## 2019-04-17 DIAGNOSIS — Z20822 Contact with and (suspected) exposure to covid-19: Secondary | ICD-10-CM

## 2019-04-17 NOTE — Telephone Encounter (Signed)
See encounter from today.

## 2019-04-17 NOTE — Telephone Encounter (Signed)
Pt had fever 97.5 - 101.5 for 5 days; now temp is 99.8 taking tylenol for fever; not sure if covid symptoms or stomach virus. Pt has had chills,abdominal pain on 04/15/19 with cramping feeling; diarrhea that is watery since 04/11/19. Last watery diarrhea was 12/27/020 at noon. Pt still having loose stools. Averaging 8-10 watery stools daily since 04/11/19.on 04/12/19 & 04/13/19 diarrhea appeared black. No travel and no known exposure to + covid. Pt has had dry mouth but heats with gas and that dries out pts mouth. Last urinated around 8 AM.no dizziness and urine is light yellow.pt has been self quarantining. Pt scheduled virtual appt today 04/17/19 at 10:40.

## 2019-04-17 NOTE — Progress Notes (Signed)
I connected with Brandon [name] on 04/17/19 at 10:40 AM EST by video and verified that I am speaking with the correct person using two identifiers.   I discussed the limitations, risks, security and privacy concerns of performing an evaluation and management service by video and the availability of in person appointments. I also discussed with the patient that there may be a patient responsible charge related to this service. The patient expressed understanding and agreed to proceed.  Patient location: Home Provider Location: [facility] Participants: Lesleigh Noe and Brandon [name]   Subjective:     Brandon [name] is a 63 y.o. male presenting for Diarrhea (fever for 5 days, chills. all started on 04/11/2019)     Diarrhea  This is a new problem. The current episode started in the past 7 days. The problem occurs more than 10 times per day. The problem has been gradually worsening. The stool consistency is described as watery. The patient states that diarrhea awakens him from sleep. Associated symptoms include abdominal pain (started yesterday), chills, a fever (tmax 101) and sweats. Pertinent negatives include no arthralgias, coughing, headaches, myalgias, URI or vomiting. There are no known risk factors. He has tried analgesics and increased fluids (ginger ale and water) for the symptoms. The treatment provided no relief.   No loss of taste/smell Low appetite Sunday - firming up - now having 5-6 BM daily  Urinating - several times a day Mouth is a little dry - heats with gas - only notices it at night, keeps a bottle of water nearby   Review of Systems  Constitutional: Positive for chills and fever (tmax 101).  Respiratory: Negative for cough.   Gastrointestinal: Positive for abdominal pain (started yesterday) and diarrhea. Negative for vomiting.  Musculoskeletal: Negative for arthralgias and myalgias.  Neurological: Negative for headaches.     Social History    Tobacco Use  Smoking Status Current Every Day Smoker  . Packs/day: 1.50  . Years: 40.00  . Pack years: 60.00  . Types: Cigarettes  Smokeless Tobacco Former Systems developer  . Types: Chew  . Quit date: 04/21/2003        Objective:   BP Readings from Last 3 Encounters:  12/13/18 116/70  06/08/18 118/72   Wt Readings from Last 3 Encounters:  12/13/18 206 lb 6.4 oz (93.6 kg)  06/08/18 207 lb 4 oz (94 kg)    Temp 99.8 F (37.7 C) Comment: per patient   Physical Exam Constitutional:      Appearance: Normal appearance. He is not ill-appearing.  HENT:     Head: Normocephalic and atraumatic.     Right Ear: External ear normal.     Left Ear: External ear normal.  Eyes:     Conjunctiva/sclera: Conjunctivae normal.  Pulmonary:     Effort: Pulmonary effort is normal. No respiratory distress.  Neurological:     Mental Status: He is alert. Mental status is at baseline.  Psychiatric:        Mood and Affect: Mood normal.        Behavior: Behavior normal.        Thought Content: Thought content normal.        Judgment: Judgment normal.            Assessment & Plan:   Problem List Items Addressed This Visit    None    Visit Diagnoses    Diarrhea of presumed infectious origin    -  Primary   Suspected COVID-19  virus infection         Discussed OTC treatment for diarrhea Given symptoms possible covid-19 and would recommend being tested ER precautions given Advised staying out of work and isolating until results have come back Information for contacting [facility] for scheduled testing provided  Advised call back if negative covid and symptoms do not resolve by next week for stool studies    Return if symptoms worsen or fail to improve.  Lesleigh Noe, MD

## 2019-04-18 ENCOUNTER — Ambulatory Visit: Payer: Medicare HMO | Attending: Internal Medicine

## 2019-04-18 DIAGNOSIS — Z20822 Contact with and (suspected) exposure to covid-19: Secondary | ICD-10-CM

## 2019-04-18 DIAGNOSIS — Z20828 Contact with and (suspected) exposure to other viral communicable diseases: Secondary | ICD-10-CM | POA: Diagnosis not present

## 2019-04-19 LAB — NOVEL CORONAVIRUS, NAA: SARS-CoV-2, NAA: NOT DETECTED

## 2019-05-30 ENCOUNTER — Other Ambulatory Visit: Payer: Self-pay | Admitting: Family Medicine

## 2019-05-30 NOTE — Telephone Encounter (Signed)
Please scheduled patient for follow up when possible in the next 2 to 3 months. Thank you

## 2019-05-31 NOTE — Telephone Encounter (Signed)
Called patient to schedule. He states he will be going out of town a few times in the next couple months and he will call back to schedule appt.

## 2019-06-15 ENCOUNTER — Ambulatory Visit (INDEPENDENT_AMBULATORY_CARE_PROVIDER_SITE_OTHER): Payer: Medicare HMO | Admitting: Family Medicine

## 2019-06-15 ENCOUNTER — Other Ambulatory Visit: Payer: Self-pay

## 2019-06-15 ENCOUNTER — Encounter: Payer: Self-pay | Admitting: Family Medicine

## 2019-06-15 VITALS — BP 108/58 | HR 66 | Temp 97.8°F | Ht 67.25 in | Wt 214.0 lb

## 2019-06-15 DIAGNOSIS — F17200 Nicotine dependence, unspecified, uncomplicated: Secondary | ICD-10-CM | POA: Diagnosis not present

## 2019-06-15 DIAGNOSIS — Z951 Presence of aortocoronary bypass graft: Secondary | ICD-10-CM | POA: Diagnosis not present

## 2019-06-15 DIAGNOSIS — I10 Essential (primary) hypertension: Secondary | ICD-10-CM | POA: Diagnosis not present

## 2019-06-15 DIAGNOSIS — M79672 Pain in left foot: Secondary | ICD-10-CM | POA: Diagnosis not present

## 2019-06-15 DIAGNOSIS — K219 Gastro-esophageal reflux disease without esophagitis: Secondary | ICD-10-CM | POA: Diagnosis not present

## 2019-06-15 DIAGNOSIS — R69 Illness, unspecified: Secondary | ICD-10-CM | POA: Diagnosis not present

## 2019-06-15 DIAGNOSIS — M79671 Pain in right foot: Secondary | ICD-10-CM | POA: Diagnosis not present

## 2019-06-15 MED ORDER — OMEPRAZOLE 20 MG PO CPDR: 20.0000 mg | DELAYED_RELEASE_CAPSULE | Freq: Every day | ORAL | 3 refills | Status: DC

## 2019-06-15 NOTE — Assessment & Plan Note (Signed)
1 month w/o cigarette. He will consider lung cancer screening - declined today due to financial issues

## 2019-06-15 NOTE — Assessment & Plan Note (Addendum)
Reflux much better, however, immediate return of symptoms when not taking medication. Given persistence of symptoms, discussed possible GI evaluation to make sure nothing more serious. Financially cannot do this now, but he will let me know when he is able. Cont omeprazole

## 2019-06-15 NOTE — Assessment & Plan Note (Signed)
Follows with cardiology. Continue plavix and asa

## 2019-06-15 NOTE — Patient Instructions (Addendum)
#   Continue taking the omperazole   When you get a job call or MyChart and I will place referrals for the following:   1) Would recommend colon cancer screening and potentially discussing reflux with the the GI  2) Would also recommend lung cancer screening   Dx: Elevated blood glucose Test: Hemoglobin A1c  Alternative option: Fasting glucose

## 2019-06-15 NOTE — Assessment & Plan Note (Signed)
BP at goal, continue medication

## 2019-06-15 NOTE — Assessment & Plan Note (Signed)
Etiology unclear. Notes some pins and needles. Normal exam. Offered trial of gabapentin - declined does not want another medication. Offered glucose check - he will check with insurance for cost. Continue to monitor. Stretches at night.

## 2019-06-15 NOTE — Progress Notes (Signed)
Subjective:     Brandon [name] is a 64 y.o. male presenting for Gastroesophageal Reflux (Omeprazole "is great". Does not have epigastric or chest pain. Very happy with med.)     HPI   #GERD - needs refill of omeprazole - in the morning will occasionally have urgent BM - feeling good - stopped for 2 days and had immediate return of symptoms - when taking the medication he does really well   #hx of CABG - still taking plavix and on ASA - seeing cardiology  - change statin medication  #Tobacco use - has quit for 1 full month now! - did stop for 3 months but slide back - no breathing issues  Financially - unemployeed  - needs to get colonoscopy    Review of Systems   Social History   Tobacco Use  Smoking Status Former Smoker  . Packs/day: 1.50  . Years: 40.00  . Pack years: 60.00  . Types: Cigarettes  . Quit date: 04/21/2019  . Years since quitting: 0.1  Smokeless Tobacco Former Systems developer  . Types: Chew  . Quit date: 04/21/2003        Objective:    BP Readings from Last 3 Encounters:  06/15/19 (!) 108/58  12/13/18 116/70  06/08/18 118/72   Wt Readings from Last 3 Encounters:  06/15/19 214 lb (97.1 kg)  12/13/18 206 lb 6.4 oz (93.6 kg)  06/08/18 207 lb 4 oz (94 kg)    BP (!) 108/58 (BP Location: Left Arm, Patient Position: Sitting, Cuff Size: Large)   Pulse 66   Temp 97.8 F (36.6 C)   Ht 5' 7.25" (1.708 m)   Wt 214 lb (97.1 kg)   SpO2 97%   BMI 33.27 kg/m    Physical Exam Constitutional:      Appearance: Normal appearance. He is not ill-appearing or diaphoretic.  HENT:     Right Ear: External ear normal.     Left Ear: External ear normal.     Nose: Nose normal.  Eyes:     General: No scleral icterus.    Extraocular Movements: Extraocular movements intact.     Conjunctiva/sclera: Conjunctivae normal.  Cardiovascular:     Rate and Rhythm: Normal rate and regular rhythm.     Heart sounds: Murmur present.  Pulmonary:     Effort: Pulmonary  effort is normal. No respiratory distress.     Breath sounds: Normal breath sounds. No wheezing.  Musculoskeletal:     Cervical back: Neck supple.     Comments: Bilateral foot Inspection: no lesions Palpation: no ttp including the ball of foot ROM: normal Strength: normal Normal monofilament testing Normal pulses  Skin:    General: Skin is warm and dry.  Neurological:     Mental Status: He is alert. Mental status is at baseline.  Psychiatric:        Mood and Affect: Mood normal.           Assessment & Plan:   Problem List Items Addressed This Visit      Cardiovascular and Mediastinum   Essential hypertension - Primary    BP at goal, continue medication        Digestive   Gastroesophageal reflux disease    Reflux much better, however, immediate return of symptoms when not taking medication. Given persistence of symptoms, discussed possible GI evaluation to make sure nothing more serious. Financially cannot do this now, but he will let me know when he is able. Cont omeprazole  Relevant Medications   omeprazole (PRILOSEC) 20 MG capsule     Other   Hx of CABG    Follows with cardiology. Continue plavix and asa      Tobacco use disorder    1 month w/o cigarette. He will consider lung cancer screening - declined today due to financial issues      Foot pain, bilateral    Etiology unclear. Notes some pins and needles. Normal exam. Offered trial of gabapentin - declined does not want another medication. Offered glucose check - he will check with insurance for cost. Continue to monitor. Stretches at night.           Return in about 6 months (around 12/13/2019).  Lesleigh Noe, MD

## 2019-06-16 ENCOUNTER — Ambulatory Visit: Payer: Medicare HMO | Attending: Internal Medicine

## 2019-06-16 DIAGNOSIS — Z23 Encounter for immunization: Secondary | ICD-10-CM | POA: Insufficient documentation

## 2019-06-16 NOTE — Progress Notes (Signed)
   Covid-19 Vaccination Clinic  Name:  Chanan Detwiler    MRN: [id number] DOB: 03-28-1956  06/16/2019  Mr. Milosevic was observed post Covid-19 immunization for 15 minutes without incidence. He was provided with Vaccine Information Sheet and instruction to access the V-Safe system.   Mr. Groh was instructed to call 911 with any severe reactions post vaccine: Marland Kitchen Difficulty breathing  . Swelling of your face and throat  . A fast heartbeat  . A bad rash all over your body  . Dizziness and weakness    Immunizations Administered    Name Date Dose VIS Date Route   Pfizer COVID-19 Vaccine 06/16/2019  9:12 AM 0.3 mL 03/31/2019 Intramuscular   Manufacturer: [place]   Lot: [id number]   [place]: [id number]

## 2019-07-12 ENCOUNTER — Ambulatory Visit: Payer: Medicare HMO | Attending: Internal Medicine

## 2019-07-12 DIAGNOSIS — Z23 Encounter for immunization: Secondary | ICD-10-CM

## 2019-07-12 NOTE — Progress Notes (Signed)
   Covid-19 Vaccination Clinic  Name:  Daris Harkins    MRN: [id number] DOB: [birth year]  07/12/2019  Mr. Matheson was observed post Covid-19 immunization for 15 minutes without incident. He was provided with Vaccine Information Sheet and instruction to access the V-Safe system.   Mr. Noyola was instructed to call 911 with any severe reactions post vaccine: Marland Kitchen Difficulty breathing  . Swelling of face and throat  . A fast heartbeat  . A bad rash all over body  . Dizziness and weakness   Immunizations Administered    Name Date Dose VIS Date Route   Pfizer COVID-19 Vaccine 07/12/2019 10:46 AM 0.3 mL 03/31/2019 Intramuscular   Manufacturer: [employer]   Lot: [id number]   [place]: [id number]

## 2019-10-02 ENCOUNTER — Other Ambulatory Visit: Payer: Medicare HMO

## 2019-10-05 ENCOUNTER — Other Ambulatory Visit: Payer: Medicare HMO

## 2019-11-27 DIAGNOSIS — L814 Other melanin hyperpigmentation: Secondary | ICD-10-CM | POA: Diagnosis not present

## 2019-11-27 DIAGNOSIS — D1801 Hemangioma of skin and subcutaneous tissue: Secondary | ICD-10-CM | POA: Diagnosis not present

## 2019-11-27 DIAGNOSIS — C44319 Basal cell carcinoma of skin of other parts of face: Secondary | ICD-10-CM | POA: Diagnosis not present

## 2019-11-27 DIAGNOSIS — L821 Other seborrheic keratosis: Secondary | ICD-10-CM | POA: Diagnosis not present

## 2019-11-27 DIAGNOSIS — L281 Prurigo nodularis: Secondary | ICD-10-CM | POA: Diagnosis not present

## 2019-11-27 DIAGNOSIS — D485 Neoplasm of uncertain behavior of skin: Secondary | ICD-10-CM | POA: Diagnosis not present

## 2019-11-27 DIAGNOSIS — D171 Benign lipomatous neoplasm of skin and subcutaneous tissue of trunk: Secondary | ICD-10-CM | POA: Diagnosis not present

## 2019-12-11 ENCOUNTER — Other Ambulatory Visit: Payer: Self-pay | Admitting: Interventional Cardiology

## 2019-12-27 ENCOUNTER — Other Ambulatory Visit: Payer: Medicare HMO | Admitting: *Deleted

## 2019-12-27 ENCOUNTER — Other Ambulatory Visit: Payer: Self-pay

## 2019-12-27 DIAGNOSIS — E782 Mixed hyperlipidemia: Secondary | ICD-10-CM

## 2019-12-27 LAB — HEPATIC FUNCTION PANEL
ALT: 18 IU/L (ref 0–44)
AST: 17 IU/L (ref 0–40)
Albumin: 4.4 g/dL (ref 3.8–4.8)
Alkaline Phosphatase: 54 IU/L (ref 48–121)
Bilirubin Total: 0.7 mg/dL (ref 0.0–1.2)
Bilirubin, Direct: 0.15 mg/dL (ref 0.00–0.40)
Total Protein: 6.5 g/dL (ref 6.0–8.5)

## 2019-12-27 LAB — LIPID PANEL
Chol/HDL Ratio: 5 ratio (ref 0.0–5.0)
Cholesterol, Total: 151 mg/dL (ref 100–199)
HDL: 30 mg/dL — ABNORMAL LOW (ref >39)
LDL Chol Calc (NIH): 78 mg/dL (ref 0–99)
Triglycerides: 259 mg/dL — ABNORMAL HIGH (ref 0–149)
VLDL Cholesterol Cal: 43 mg/dL — ABNORMAL HIGH (ref 5–40)

## 2019-12-29 ENCOUNTER — Other Ambulatory Visit: Payer: Self-pay

## 2019-12-29 MED ORDER — EZETIMIBE 10 MG PO TABS: 10.0000 mg | ORAL_TABLET | Freq: Every day | ORAL | 0 refills | Status: DC

## 2020-01-14 ENCOUNTER — Other Ambulatory Visit: Payer: Self-pay | Admitting: Interventional Cardiology

## 2020-02-06 NOTE — Progress Notes (Signed)
Cardiology Office Note   Date:  02/07/2020   ID:  Brandon [name], DOB 1956/01/23, MRN [id number]  PCP:  Lesleigh Noe, MD    No chief complaint on file.  CAD  Wt Readings from Last 3 Encounters:  02/07/20 211 lb (95.7 kg)  06/15/19 214 lb (97.1 kg)  12/13/18 206 lb 6.4 oz (93.6 kg)       History of Present Illness: Brandon [name] is a 64 y.o. male  With a history of hypertension, CAD.  In 2000, angina was back pain but then went to this left arm.  He had an MI at that time and later had CABG, LIMA and 2 radial arteries.  CAD history: CABG x 3 in 2000, [place] , [place]. [place] [facility] 2005: Stent at [facility].  Angina felt like intermittent back pain.  2015: Stent at [facility], 2.25 x 16 Promus in Radial to RCA.  He has been on Plavix in the past. His PMD tried to stop his Plavix, but he preferred to stay on it.  Now lives in [place], [place].  He has been unable to quit smoking over the years.   Since the last visit, he has done well.  Denies : Chest pain. Dizziness. Leg edema. Nitroglycerin use. Orthopnea. Palpitations. Paroxysmal nocturnal dyspnea. Shortness of breath. Syncope.   Not walking much.  He is doing more as the weather cools.  He smokes intermittently- also an e-cigarette as well.   He got his COVID vaccines.  No problems.     Past Medical History:  Diagnosis Date  . Arthritis   . Basal cell carcinoma   . CAD (coronary artery disease) 06/14/2018   Cath on 01/23/2014 with LAD occlusion  . Essential hypertension 06/08/2018  . Gastroesophageal reflux disease 06/08/2018  . GERD (gastroesophageal reflux disease)   . Heart disease   . Heart failure with reduced ejection fraction ([place]) 06/14/2018   ECHO 01/22/2014 with EF 40-45%  . History of chicken pox   . History of migraine   . Hx of basal cell carcinoma 06/08/2018   04/2017. Left jaw  . Hx of CABG   . Hyperlipidemia   . Inguinal hernia   . Mixed hyperlipidemia 06/08/2018  . Tobacco use disorder  06/08/2018    Past Surgical History:  Procedure Laterality Date  . APPENDECTOMY  1960  . CORONARY ARTERY BYPASS GRAFT     x 3  . INGUINAL HERNIA REPAIR  2008  . SKIN CANCER EXCISION    . Stent placed     in 2005 and in 2014     Current Outpatient Medications  Medication Sig Dispense Refill  . amLODipine (NORVASC) 5 MG tablet Take 1 tablet (5 mg total) by mouth daily. 90 tablet 3  . aspirin EC 81 MG tablet Take 1 tablet (81 mg total) by mouth daily. 90 tablet 3  . atorvastatin (LIPITOR) 40 MG tablet TAKE 1 (40 MG TOTAL) DAILY. MUST SCHEDULE FOLLOW UP APPT FOR FURTHER REFILLS. [id number] 30 tablet 0  . clopidogrel (PLAVIX) 75 MG tablet Take 1 tablet (75 mg total) by mouth daily. 90 tablet 3  . metoprolol tartrate (LOPRESSOR) 25 MG tablet Take 1 tablet (25 mg total) by mouth 2 (two) times daily. 180 tablet 3  . Multiple Vitamin (MULTIVITAMIN) tablet Take 1 tablet by mouth daily.    . nitroGLYCERIN (NITROSTAT) 0.4 MG SL tablet Place 1 tablet (0.4 mg total) under the tongue every 5 (five) minutes as needed for chest pain. 25 tablet  3  . omeprazole (PRILOSEC) 20 MG capsule Take 1 capsule (20 mg total) by mouth daily. 90 capsule 3   No current facility-administered medications for this visit.    Allergies:   Antihistamines, loratadine-type; Penicillins; Shellfish allergy; and Iodine    Social History:  The patient  reports that he quit smoking about 9 months ago. His smoking use included cigarettes. He has a 60.00 pack-year smoking history. He quit smokeless tobacco use about 16 years ago.  His smokeless tobacco use included chew. He reports current alcohol use. He reports current drug use. Drug: Marijuana.   Family History:  The patient's family history includes Colon cancer (age of onset: 44) in his father; Colon polyps in his sister; Dementia in his mother; Diabetes in his father; Heart attack in his maternal grandfather and maternal grandmother; Heart attack (age of onset: 46) in his  mother; Heart attack (age of onset: 22) in his brother; Heart disease in his mother; Prostate cancer in his paternal grandfather; Stroke (age of onset: 60) in his paternal grandmother.    ROS:  Please see the history of present illness.   Otherwise, review of systems are positive for controlled BPs at home; some dietary indiscretion.   All other systems are reviewed and negative.    PHYSICAL EXAM: VS:  BP 134/68   Pulse (!) 55   Ht 5' 7.25" (1.708 m)   Wt 211 lb (95.7 kg)   SpO2 99%   BMI 32.80 kg/m  , BMI Body mass index is 32.8 kg/m. GEN: Well nourished, well developed, in no acute distress  HEENT: normal  Neck: no JVD, carotid bruits, or masses Cardiac: RRR; no murmurs, rubs, or gallops,no edema  Respiratory:  clear to auscultation bilaterally, normal work of breathing GI: soft, nontender, nondistended, + BS MS: no deformity or atrophy  Skin: warm and dry, no rash Neuro:  Strength and sensation are intact Psych: euthymic mood, full affect   EKG:   The ekg ordered today demonstrates NSR, nonspecific ST changes   Recent Labs: 12/27/2019: ALT 18   Lipid Panel    Component Value Date/Time   CHOL 151 12/27/2019 0922   TRIG 259 (H) 12/27/2019 0922   HDL 30 (L) 12/27/2019 0922   CHOLHDL 5.0 12/27/2019 0922   LDLCALC 78 12/27/2019 0922     Other studies Reviewed: Additional studies/ records that were reviewed today with results demonstrating: LDL 78 in 9/21.   ASSESSMENT AND PLAN:  1. CAD: s/p CABG.  Continue aggressive secondary prevention.  Healthy diet recommended.  No sx at all of any post supper GERD sx.  These sx have concerned him for heart problems in the past.  He feels much better with OTC prilosec.   2. HTN: The current medical regimen is effective;  continue present plan and medications. 3. Tobacco abuse: using e-cig to stop smoking.  He knows he should try to stop using this as well. Discussed patch but he declined. 4. Hyperlipidemia:  Zetia was too  expensive.  Will switch atorvastatin to rosuvastatin 40 mg given that the TG are mildly elevated.   If Crestor is expensive, will go back to atorvastatin.    Current medicines are reviewed at length with the patient today.  The patient concerns regarding his medicines were addressed.  The following changes have been made:  No change  Labs/ tests ordered today include:  No orders of the defined types were placed in this encounter.   Recommend 150 minutes/week of aerobic exercise Low  fat, low carb, high fiber diet recommended  Disposition:   FU in 1 year   Signed, Larae Grooms, MD  02/07/2020 10:29 AM    [facility] [address], [place], [place]  [address] Phone: [phone]; Fax: [phone]

## 2020-02-07 ENCOUNTER — Other Ambulatory Visit: Payer: Self-pay

## 2020-02-07 ENCOUNTER — Ambulatory Visit: Payer: Medicare HMO | Admitting: Interventional Cardiology

## 2020-02-07 ENCOUNTER — Encounter: Payer: Self-pay | Admitting: Interventional Cardiology

## 2020-02-07 VITALS — BP 134/68 | HR 55 | Ht 67.25 in | Wt 211.0 lb

## 2020-02-07 DIAGNOSIS — Z951 Presence of aortocoronary bypass graft: Secondary | ICD-10-CM | POA: Diagnosis not present

## 2020-02-07 DIAGNOSIS — I25118 Atherosclerotic heart disease of native coronary artery with other forms of angina pectoris: Secondary | ICD-10-CM

## 2020-02-07 DIAGNOSIS — E782 Mixed hyperlipidemia: Secondary | ICD-10-CM | POA: Diagnosis not present

## 2020-02-07 DIAGNOSIS — I10 Essential (primary) hypertension: Secondary | ICD-10-CM | POA: Diagnosis not present

## 2020-02-07 MED ORDER — ROSUVASTATIN CALCIUM 40 MG PO TABS: 40.0000 mg | ORAL_TABLET | Freq: Every day | ORAL | 3 refills | Status: DC

## 2020-02-07 NOTE — Patient Instructions (Addendum)
Medication Instructions:  Your physician has recommended you make the following change in your medication: Stop Atorvastatin. Start Rosuvastatin 40 mg by mouth daily.   *If you need a refill on your cardiac medications before your next appointment, please call your pharmacy*   Lab Work: Your physician recommends that you return for lab work in: 3 months.  Scheduled for January 20,2022.  This will be fasting.  The lab opens at 7:30 AM  If you have labs (blood work) drawn today and your tests are completely normal, you will receive your results only by: Marland Kitchen MyChart Message (if you have MyChart) OR . A paper copy in the mail If you have any lab test that is abnormal or we need to change your treatment, we will call you to review the results.   Testing/Procedures: none   Follow-Up: At [facility], you and your health needs are our priority.  As part of our continuing mission to provide you with exceptional heart care, we have created designated Provider Care Teams.  These Care Teams include your primary Cardiologist (physician) and Advanced Practice Providers (APPs -  Physician Assistants and Nurse Practitioners) who all work together to provide you with the care you need, when you need it.  We recommend signing up for the patient portal called "MyChart".  Sign up information is provided on this After Visit Summary.  MyChart is used to connect with patients for Virtual Visits (Telemedicine).  Patients are able to view lab/test results, encounter notes, upcoming appointments, etc.  Non-urgent messages can be sent to your provider as well.   To learn more about what you can do with MyChart, go to [email or web address].    Your next appointment:   1 year(s)  The format for your next appointment:   In Person  Provider:   You may see Dr Irish Lack or one of the following Advanced Practice Providers on your designated Care Team:    Melina Copa, PA-C  Ermalinda Barrios, PA-C    Other  Instructions  High-Fiber Diet Fiber, also called dietary fiber, is a type of carbohydrate that is found in fruits, vegetables, whole grains, and beans. A high-fiber diet can have many health benefits. Your health care provider may recommend a high-fiber diet to help:  Prevent constipation. Fiber can make your bowel movements more regular.  Lower your cholesterol.  Relieve the following conditions: ? Swelling of veins in the anus (hemorrhoids). ? Swelling and irritation (inflammation) of specific areas of the digestive tract (uncomplicated diverticulosis). ? A problem of the large intestine (colon) that sometimes causes pain and diarrhea (irritable bowel syndrome, IBS).  Prevent overeating as part of a weight-loss plan.  Prevent heart disease, type 2 diabetes, and certain cancers. What is my plan? The recommended daily fiber intake in grams (g) includes:  38 g for men age 51 or younger.  30 g for men over age 8.  53 g for women age 61 or younger.  21 g for women over age 65. You can get the recommended daily intake of dietary fiber by:  Eating a variety of fruits, vegetables, grains, and beans.  Taking a fiber supplement, if it is not possible to get enough fiber through your diet. What do I need to know about a high-fiber diet?  It is better to get fiber through food sources rather than from fiber supplements. There is not a lot of research about how effective supplements are.  Always check the fiber content on the nutrition facts label of  any prepackaged food. Look for foods that contain 5 g of fiber or more per serving.  Talk with a diet and nutrition specialist (dietitian) if you have questions about specific foods that are recommended or not recommended for your medical condition, especially if those foods are not listed below.  Gradually increase how much fiber you consume. If you increase your intake of dietary fiber too quickly, you may have bloating, cramping, or  gas.  Drink plenty of water. Water helps you to digest fiber. What are tips for following this plan?  Eat a wide variety of high-fiber foods.  Make sure that half of the grains that you eat each day are whole grains.  Eat breads and cereals that are made with whole-grain flour instead of refined flour or white flour.  Eat brown rice, bulgur wheat, or millet instead of white rice.  Start the day with a breakfast that is high in fiber, such as a cereal that contains 5 g of fiber or more per serving.  Use beans in place of meat in soups, salads, and pasta dishes.  Eat high-fiber snacks, such as berries, raw vegetables, nuts, and popcorn.  Choose whole fruits and vegetables instead of processed forms like juice or sauce. What foods can I eat?  Fruits Berries. Pears. Apples. Oranges. Avocado. Prunes and raisins. Dried figs. Vegetables Sweet potatoes. Spinach. Kale. Artichokes. Cabbage. Broccoli. Cauliflower. Green peas. Carrots. Squash. Grains Whole-grain breads. Multigrain cereal. Oats and oatmeal. Brown rice. Barley. Bulgur wheat. [place]. Quinoa. Bran muffins. Popcorn. Rye wafer crackers. Meats and other proteins Navy, kidney, and pinto beans. Soybeans. Split peas. Lentils. Nuts and seeds. Dairy Fiber-fortified yogurt. Beverages Fiber-fortified soy milk. Fiber-fortified orange juice. Other foods Fiber bars. The items listed above may not be a complete list of recommended foods and beverages. Contact a dietitian for more options. What foods are not recommended? Fruits Fruit juice. Cooked, strained fruit. Vegetables Fried potatoes. Canned vegetables. Well-cooked vegetables. Grains White bread. Pasta made with refined flour. White rice. Meats and other proteins Fatty cuts of meat. Fried chicken or fried fish. Dairy Milk. Yogurt. Cream cheese. Sour cream. Fats and oils Butters. Beverages Soft drinks. Other foods Cakes and pastries. The items listed above may not be a  complete list of foods and beverages to avoid. Contact a dietitian for more information. Summary  Fiber is a type of carbohydrate. It is found in fruits, vegetables, whole grains, and beans.  There are many health benefits of eating a high-fiber diet, such as preventing constipation, lowering blood cholesterol, helping with weight loss, and reducing your risk of heart disease, diabetes, and certain cancers.  Gradually increase your intake of fiber. Increasing too fast can result in cramping, bloating, and gas. Drink plenty of water while you increase your fiber.  The best sources of fiber include whole fruits and vegetables, whole grains, nuts, seeds, and beans. This information is not intended to replace advice given to you by your health care provider. Make sure you discuss any questions you have with your health care provider. Document Revised: 02/08/2017 Document Reviewed: 02/08/2017 Elsevier Patient Education  2020 [employer].

## 2020-02-08 ENCOUNTER — Other Ambulatory Visit: Payer: Self-pay | Admitting: Interventional Cardiology

## 2020-02-08 ENCOUNTER — Ambulatory Visit: Payer: Medicare HMO | Admitting: Interventional Cardiology

## 2020-02-08 DIAGNOSIS — Z951 Presence of aortocoronary bypass graft: Secondary | ICD-10-CM

## 2020-02-15 ENCOUNTER — Other Ambulatory Visit: Payer: Self-pay | Admitting: Cardiovascular Disease

## 2020-02-27 DIAGNOSIS — R69 Illness, unspecified: Secondary | ICD-10-CM | POA: Diagnosis not present

## 2020-03-08 ENCOUNTER — Other Ambulatory Visit: Payer: Self-pay | Admitting: Interventional Cardiology

## 2020-03-08 DIAGNOSIS — Z951 Presence of aortocoronary bypass graft: Secondary | ICD-10-CM

## 2020-03-08 DIAGNOSIS — I10 Essential (primary) hypertension: Secondary | ICD-10-CM

## 2020-03-11 ENCOUNTER — Other Ambulatory Visit: Payer: Self-pay | Admitting: Cardiovascular Disease

## 2020-04-09 ENCOUNTER — Telehealth (INDEPENDENT_AMBULATORY_CARE_PROVIDER_SITE_OTHER): Payer: Medicare HMO | Admitting: Family Medicine

## 2020-04-09 ENCOUNTER — Encounter: Payer: Self-pay | Admitting: Family Medicine

## 2020-04-09 VITALS — Wt 210.0 lb

## 2020-04-09 DIAGNOSIS — H6691 Otitis media, unspecified, right ear: Secondary | ICD-10-CM | POA: Diagnosis not present

## 2020-04-09 MED ORDER — DOXYCYCLINE HYCLATE 100 MG PO TABS: 100.0000 mg | ORAL_TABLET | Freq: Two times a day (BID) | ORAL | 0 refills | Status: DC

## 2020-04-09 NOTE — Patient Instructions (Addendum)
#   Ear pain - try pseudoephedrine or flonase - and heat pack  If not improving then you can start the antibiotic   If worsening symptoms call the clinic

## 2020-04-09 NOTE — Progress Notes (Signed)
I connected with Brandon [name] on 04/09/20 at  9:40 AM EST by video and verified that I am speaking with the correct person using two identifiers.   I discussed the limitations, risks, security and privacy concerns of performing an evaluation and management service by video and the availability of in person appointments. I also discussed with the patient that there may be a patient responsible charge related to this service. The patient expressed understanding and agreed to proceed.  Patient location: Home Provider Location: [place] Participants: Lesleigh Noe and Brandon [name]   Subjective:     Brandon [name] is a 64 y.o. male presenting for Ear Pain (R, with tenderness in side of face X 4 days ) and Chills (Just last night )     Otalgia  There is pain in the right ear. This is a new problem. The current episode started in the past 7 days. There has been no fever. Associated symptoms include headaches and neck pain (same side as ear). Pertinent negatives include no coughing, diarrhea, ear discharge, hearing loss, rhinorrhea, sore throat or vomiting. Associated symptoms comments: Chronic tinnitus . He has tried acetaminophen for the symptoms.   Then started having jaw pain 20 years ago was diagnosed with bony growth in the right ear - will need to flush his ear due to wax build up -- but this does not usually have pain associates  No known exposure to covid or other people with sick symptoms  Did get the covid booster  Did get the flu shot this year  04/06/2020  Review of Systems  Constitutional: Positive for chills. Negative for fever.  HENT: Positive for ear pain. Negative for congestion, ear discharge, hearing loss, postnasal drip, rhinorrhea and sore throat.   Eyes: Negative for discharge and redness.  Respiratory: Negative for cough and shortness of breath.   Cardiovascular: Negative for chest pain.  Gastrointestinal: Negative for diarrhea, nausea and  vomiting.  Musculoskeletal: Positive for neck pain (same side as ear).  Neurological: Positive for headaches.     Social History   Tobacco Use  Smoking Status Former Smoker  . Packs/day: 1.50  . Years: 40.00  . Pack years: 60.00  . Types: Cigarettes  . Quit date: 04/21/2019  . Years since quitting: 0.9  Smokeless Tobacco Former Systems developer  . Types: Chew  . Quit date: 04/21/2003        Objective:   BP Readings from Last 3 Encounters:  02/07/20 134/68  06/15/19 (!) 108/58  12/13/18 116/70   Wt Readings from Last 3 Encounters:  04/09/20 210 lb (95.3 kg)  02/07/20 211 lb (95.7 kg)  06/15/19 214 lb (97.1 kg)   Wt 210 lb (95.3 kg)   BMI 32.65 kg/m    Physical Exam Constitutional:      Appearance: Normal appearance. He is not ill-appearing.  HENT:     Head: Normocephalic and atraumatic.     Right Ear: External ear normal.     Left Ear: External ear normal.  Eyes:     Conjunctiva/sclera: Conjunctivae normal.  Pulmonary:     Effort: Pulmonary effort is normal. No respiratory distress.  Neurological:     Mental Status: He is alert. Mental status is at baseline.  Psychiatric:        Mood and Affect: Mood normal.        Behavior: Behavior normal.        Thought Content: Thought content normal.  Judgment: Judgment normal.              Assessment & Plan:   Problem List Items Addressed This Visit   None   Visit Diagnoses    Acute infection of right ear    -  Primary   Relevant Medications   doxycycline (VIBRA-TABS) 100 MG tablet     Discussed difficulty diagnosing due to virtual. Recommended 2 days of pseudoephedrine and flonase and Abx if not improved  Return for in-person if it does not resolve after antibiotics   Return if symptoms worsen or fail to improve.  Lesleigh Noe, MD

## 2020-04-16 ENCOUNTER — Encounter: Payer: Self-pay | Admitting: Family Medicine

## 2020-04-16 ENCOUNTER — Ambulatory Visit (INDEPENDENT_AMBULATORY_CARE_PROVIDER_SITE_OTHER): Payer: Medicare HMO | Admitting: Family Medicine

## 2020-04-16 ENCOUNTER — Other Ambulatory Visit: Payer: Self-pay

## 2020-04-16 DIAGNOSIS — H6980 Other specified disorders of Eustachian tube, unspecified ear: Secondary | ICD-10-CM | POA: Insufficient documentation

## 2020-04-16 DIAGNOSIS — H6981 Other specified disorders of Eustachian tube, right ear: Secondary | ICD-10-CM

## 2020-04-16 DIAGNOSIS — H6990 Unspecified Eustachian tube disorder, unspecified ear: Secondary | ICD-10-CM | POA: Insufficient documentation

## 2020-04-16 MED ORDER — PREDNISONE 10 MG PO TABS: ORAL_TABLET | ORAL | 0 refills | Status: DC

## 2020-04-16 NOTE — Progress Notes (Signed)
Subjective:    Patient ID: Brandon [name], male    DOB: 08-26-1955, 64 y.o.   MRN: [id number]  This visit occurred during the SARS-CoV-2 public health emergency.  Safety protocols were in place, including screening questions prior to the visit, additional usage of staff PPE, and extensive cleaning of exam room while observing appropriate contact time as indicated for disinfecting solutions.    HPI 64 yo pt of Dr Einar Pheasant presents with ear c/o   Wt Readings from Last 3 Encounters:  04/16/20 213 lb 1 oz (96.6 kg)  04/09/20 210 lb (95.3 kg)  02/07/20 211 lb (95.7 kg)   33.12 kg/m  Suspects wax   Was seen virtually 12/21 for suspected ear inf Px doxycycline and recommended pseudoephedrine for 2d and steroid ns He never filled abx   Symptoms started 12/18 (out of town)  Has a bony growth in R ear- gets more wax build up (he flushes himself)  Had pain in R ear and it felt stopped up  Wednesday- jaw pain  thurs /fri- better but still felt stopped up   Right now- the ear is stopped up  occ a little pain /fleeting  Can cause a headache on that side   No fever  No congestion   Patient Active Problem List   Diagnosis Date Noted  . ETD (eustachian tube dysfunction) 04/16/2020  . Foot pain, bilateral 06/15/2019  . Heart failure with reduced ejection fraction ([place]) 06/14/2018  . CAD (coronary artery disease) 06/14/2018  . Essential hypertension 06/08/2018  . Mixed hyperlipidemia 06/08/2018  . Hx of CABG 06/08/2018  . Hx of basal cell carcinoma 06/08/2018  . Gastroesophageal reflux disease 06/08/2018  . Tobacco use disorder 06/08/2018   Past Medical History:  Diagnosis Date  . Arthritis   . Basal cell carcinoma   . CAD (coronary artery disease) 06/14/2018   Cath on 01/23/2014 with LAD occlusion  . Essential hypertension 06/08/2018  . Gastroesophageal reflux disease 06/08/2018  . GERD (gastroesophageal reflux disease)   . Heart disease   . Heart failure with reduced ejection  fraction ([place]) 06/14/2018   ECHO 01/22/2014 with EF 40-45%  . History of chicken pox   . History of migraine   . Hx of basal cell carcinoma 06/08/2018   04/2017. Left jaw  . Hx of CABG   . Hyperlipidemia   . Inguinal hernia   . Mixed hyperlipidemia 06/08/2018  . Tobacco use disorder 06/08/2018   Past Surgical History:  Procedure Laterality Date  . APPENDECTOMY  1960  . CORONARY ARTERY BYPASS GRAFT     x 3  . INGUINAL HERNIA REPAIR  2008  . SKIN CANCER EXCISION    . Stent placed     in 2005 and in 2014   Social History   Tobacco Use  . Smoking status: Former Smoker    Packs/day: 1.50    Years: 40.00    Pack years: 60.00    Types: Cigarettes    Quit date: 04/21/2019    Years since quitting: 0.9  . Smokeless tobacco: Former Systems developer    Types: [place] date: 04/21/2003  Vaping Use  . Vaping Use: Never used  Substance Use Topics  . Alcohol use: Yes    Comment: 2 to 3 beers daily  . Drug use: Yes    Types: Marijuana    Comment: once a week   Family History  Problem Relation Age of Onset  . Dementia Mother   . Heart attack  Mother 9  . Heart disease Mother   . Colon cancer Father 63  . Diabetes Father   . Colon polyps Sister   . Heart attack Brother 52  . Heart attack Maternal Grandmother   . Heart attack Maternal Grandfather   . Stroke Paternal Grandmother 26  . Prostate cancer Paternal Grandfather    Allergies  Allergen Reactions  . Antihistamines, Loratadine-Type Hives    Any kind of antihistamines-hallucination, swelling up, itching, rash  . Penicillins Other (See Comments)    Throat swelling up, hallucinations.  . Shellfish Allergy Swelling    More as a kid, and now mild reaction-itchy eyes and nose  . Iodine Nausea And Vomiting    Really nauseas and vomited.   Current Outpatient Medications on File Prior to Visit  Medication Sig Dispense Refill  . amLODipine (NORVASC) 5 MG tablet TAKE 1 TABLET BY MOUTH EVERY DAY 90 tablet 3  . aspirin EC 81 MG tablet Take  1 tablet (81 mg total) by mouth daily. 90 tablet 3  . clopidogrel (PLAVIX) 75 MG tablet TAKE 1 TABLET BY MOUTH EVERY DAY 90 tablet 3  . metoprolol tartrate (LOPRESSOR) 25 MG tablet TAKE 1 TABLET BY MOUTH TWICE A DAY 180 tablet 3  . Multiple Vitamin (MULTIVITAMIN) tablet Take 1 tablet by mouth daily.    . nitroGLYCERIN (NITROSTAT) 0.4 MG SL tablet Place 1 tablet (0.4 mg total) under the tongue every 5 (five) minutes as needed for chest pain. 25 tablet 3  . omeprazole (PRILOSEC) 20 MG capsule Take 1 capsule (20 mg total) by mouth daily. 90 capsule 3  . rosuvastatin (CRESTOR) 40 MG tablet Take 1 tablet (40 mg total) by mouth daily. 90 tablet 3   No current facility-administered medications on file prior to visit.    Review of Systems  Constitutional: Negative for activity change, appetite change, fatigue, fever and unexpected weight change.  HENT: Positive for ear pain and hearing loss. Negative for congestion, ear discharge, facial swelling, rhinorrhea, sore throat, tinnitus, trouble swallowing and voice change.   Eyes: Negative for pain, redness, itching and visual disturbance.  Respiratory: Negative for cough, chest tightness, shortness of breath and wheezing.   Cardiovascular: Negative for chest pain and palpitations.  Gastrointestinal: Negative for abdominal pain, blood in stool, constipation, diarrhea and nausea.  Endocrine: Negative for cold intolerance, heat intolerance, polydipsia and polyuria.  Genitourinary: Negative for difficulty urinating, dysuria, frequency and urgency.  Musculoskeletal: Negative for arthralgias, joint swelling and myalgias.  Skin: Negative for pallor and rash.  Neurological: Negative for dizziness, tremors, weakness, numbness and headaches.  Hematological: Negative for adenopathy. Does not bruise/bleed easily.  Psychiatric/Behavioral: Negative for decreased concentration and dysphoric mood. The patient is not nervous/anxious.        Objective:   Physical  Exam Constitutional:      General: He is not in acute distress.    Appearance: Normal appearance. He is obese. He is not ill-appearing.  HENT:     Head: Normocephalic and atraumatic.     Comments: No sinus tenderness    Right Ear: Decreased hearing noted. No drainage, swelling or tenderness. A middle ear effusion is present. There is no impacted cerumen. No mastoid tenderness. Tympanic membrane is not perforated, erythematous, retracted or bulging.     Left Ear: Hearing, tympanic membrane, ear canal and external ear normal.     Ears:     Comments: R ear- TM is dull with small effusion  Canal is smaller on the R (anatomic) -no  swelling or erythema     Nose: Nose normal. No congestion.     Mouth/Throat:     Mouth: Mucous membranes are moist.     Pharynx: No posterior oropharyngeal erythema.  Eyes:     General:        Right eye: No discharge.        Left eye: No discharge.     Conjunctiva/sclera: Conjunctivae normal.     Pupils: Pupils are equal, round, and reactive to light.  Cardiovascular:     Rate and Rhythm: Normal rate and regular rhythm.     Heart sounds: Normal heart sounds.  Pulmonary:     Effort: Pulmonary effort is normal. No respiratory distress.     Breath sounds: Normal breath sounds. No wheezing.  Musculoskeletal:     Cervical back: Normal range of motion.  Lymphadenopathy:     Cervical: No cervical adenopathy.  Skin:    General: Skin is warm and dry.     Findings: No erythema or rash.  Neurological:     Mental Status: He is alert.     Cranial Nerves: No cranial nerve deficit.  Psychiatric:        Mood and Affect: Mood normal.           Assessment & Plan:   Problem List Items Addressed This Visit      Nervous and Auditory   ETD (eustachian tube dysfunction)    Causing pressure and occ pain in R ear with dec hearing  Px prednisone 30 mg taper and disc side eff He uses flonase daily  Update if not starting to improve in a week or if worsening   Esp  if pain -will call

## 2020-04-16 NOTE — Patient Instructions (Signed)
Take prednisone as directed for ear pressure  This should help open up the inner ear tube If pain returns or is severe let us know   Update if not starting to improve in a week or if worsening

## 2020-04-16 NOTE — Assessment & Plan Note (Signed)
Causing pressure and occ pain in R ear with dec hearing  Px prednisone 30 mg taper and disc side eff He uses flonase daily  Update if not starting to improve in a week or if worsening   Esp if pain -will call

## 2020-04-22 ENCOUNTER — Ambulatory Visit: Payer: Medicare HMO | Admitting: Family Medicine

## 2020-05-09 ENCOUNTER — Other Ambulatory Visit: Payer: Medicare HMO | Admitting: *Deleted

## 2020-05-09 ENCOUNTER — Other Ambulatory Visit: Payer: Self-pay

## 2020-05-09 DIAGNOSIS — I25118 Atherosclerotic heart disease of native coronary artery with other forms of angina pectoris: Secondary | ICD-10-CM

## 2020-05-09 DIAGNOSIS — E782 Mixed hyperlipidemia: Secondary | ICD-10-CM | POA: Diagnosis not present

## 2020-05-09 LAB — LIPID PANEL
Chol/HDL Ratio: 4.1 ratio (ref 0.0–5.0)
Cholesterol, Total: 157 mg/dL (ref 100–199)
HDL: 38 mg/dL — ABNORMAL LOW (ref >39)
LDL Chol Calc (NIH): 78 mg/dL (ref 0–99)
Triglycerides: 248 mg/dL — ABNORMAL HIGH (ref 0–149)
VLDL Cholesterol Cal: 41 mg/dL — ABNORMAL HIGH (ref 5–40)

## 2020-05-09 LAB — HEPATIC FUNCTION PANEL
ALT: 25 IU/L (ref 0–44)
AST: 16 IU/L (ref 0–40)
Albumin: 4.5 g/dL (ref 3.8–4.8)
Alkaline Phosphatase: 59 IU/L (ref 44–121)
Bilirubin Total: 0.3 mg/dL (ref 0.0–1.2)
Bilirubin, Direct: 0.1 mg/dL (ref 0.00–0.40)
Total Protein: 6.4 g/dL (ref 6.0–8.5)

## 2020-05-13 ENCOUNTER — Telehealth: Payer: Self-pay | Admitting: Pharmacy Technician

## 2020-05-13 DIAGNOSIS — E782 Mixed hyperlipidemia: Secondary | ICD-10-CM

## 2020-05-13 NOTE — Telephone Encounter (Addendum)
Pharmacy reached out to Mr. Favia over the phone regarding recent lipid panel to discuss options for medication optimization. Patient is requesting to be called back tomorrow morning.   Patient with extensive and progressive cardiac disease (CABG s/p MI in 2000, stent s/p unstable angina in 2005 and 2015). Would argue for a more aggressive LDL goal of <55 given this history. LDL still above goal on rosuvastatin 40 (LDL 78) and TG remains elevated above goal <150 not on medication (TG 248).   Patient with [employer] Medicare Part D and may qualify for [facility]. For LDL lowering, patient would benefit from either Nexlizet or a PCSK9 inhibitor. For triglycerides, patient would benefit from starting fenofibrate. Pt has shellfish allergy so will avoid Vascepa.  Will reach out to patient over the phone tomorrow morning to discuss medication options, clarify shellfish allergy, and determine interest in applying for [employer].

## 2020-05-14 MED ORDER — EZETIMIBE 10 MG PO TABS: 10.0000 mg | ORAL_TABLET | Freq: Every day | ORAL | 3 refills | Status: DC

## 2020-05-14 MED ORDER — FENOFIBRATE 145 MG PO TABS: 145.0000 mg | ORAL_TABLET | Freq: Every day | ORAL | 3 refills | Status: DC

## 2020-05-14 NOTE — Telephone Encounter (Signed)
Spoke with pt. Money is tight so he is only able to afford tier 1 medications. Discussed adding fenofibrate for his TG as well as restarting ezetimibe (previously prescribed this last year but copay was cost prohibitive). Avoiding Vascepa due to swelling with fish oil. Pt was agreeable since I was able to get him approved for [employer] so that he will not have a copay for either medication. Scheduled f/u labs in another 3 months. He is aware to continue his rosuvastatin as well.

## 2020-05-23 ENCOUNTER — Encounter: Payer: Self-pay | Admitting: Gastroenterology

## 2020-05-28 ENCOUNTER — Ambulatory Visit: Payer: Medicare HMO | Admitting: Physician Assistant

## 2020-06-05 ENCOUNTER — Ambulatory Visit: Payer: Medicare HMO | Admitting: Gastroenterology

## 2020-06-05 ENCOUNTER — Encounter: Payer: Self-pay | Admitting: Gastroenterology

## 2020-06-05 ENCOUNTER — Telehealth: Payer: Self-pay | Admitting: *Deleted

## 2020-06-05 VITALS — BP 142/82 | HR 70 | Ht 68.0 in | Wt 215.0 lb

## 2020-06-05 DIAGNOSIS — Z7902 Long term (current) use of antithrombotics/antiplatelets: Secondary | ICD-10-CM | POA: Diagnosis not present

## 2020-06-05 DIAGNOSIS — Z1211 Encounter for screening for malignant neoplasm of colon: Secondary | ICD-10-CM | POA: Diagnosis not present

## 2020-06-05 DIAGNOSIS — R0789 Other chest pain: Secondary | ICD-10-CM | POA: Insufficient documentation

## 2020-06-05 DIAGNOSIS — Z800 Family history of malignant neoplasm of digestive organs: Secondary | ICD-10-CM | POA: Diagnosis not present

## 2020-06-05 DIAGNOSIS — R142 Eructation: Secondary | ICD-10-CM | POA: Insufficient documentation

## 2020-06-05 MED ORDER — CLENPIQ 10-3.5-12 MG-GM -GM/160ML PO SOLN: 1.0000 | Freq: Once | ORAL | 0 refills | Status: AC

## 2020-06-05 NOTE — Telephone Encounter (Signed)
[facility] Pre-operative Risk Assessment     Request for surgical clearance:     Endoscopy Procedure  What type of surgery is being performed?     Endoscopy/colonscopy  When is this surgery scheduled?     07/12/20  What type of clearance is required ?   Pharmacy  Are there any medications that need to be held prior to surgery and how long? Plavix 5 days  Practice name and name of physician performing surgery?      [facility]  What is your office phone and fax number?      Phone- [phone]  [phone]  Anesthesia type (None, local, MAC, general) ?       MAC

## 2020-06-05 NOTE — Patient Instructions (Addendum)
If you are age 66 or older, your body mass index should be between 23-30. Your Body mass index is 32.69 kg/m. If this is out of the aforementioned range listed, please consider follow up with your Primary Care Provider.  If you are age 58 or younger, your body mass index should be between 19-25. Your Body mass index is 32.69 kg/m. If this is out of the aformentioned range listed, please consider follow up with your Primary Care Provider.   You have been scheduled for an endoscopy and colonoscopy. Please follow the written instructions given to you at your visit today. Please pick up your prep supplies at the pharmacy within the next 1-3 days. If you use inhalers (even only as needed), please bring them with you on the day of your procedure.  You will be contacted by our office prior to your procedure for directions on holding your Plavix.  If you do not hear from our office 1 week prior to your scheduled procedure, please call [phone] to discuss.    Due to recent changes in healthcare laws, you may see the results of your imaging and laboratory studies on MyChart before your provider has had a chance to review them.  We understand that in some cases there may be results that are confusing or concerning to you. Not all laboratory results come back in the same time frame and the provider may be waiting for multiple results in order to interpret others.  Please give [place] 48 hours in order for your provider to thoroughly review all the results before contacting the office for clarification of your results.   Thank you for choosing me and [facility].  Alonza Bogus, PA-C

## 2020-06-05 NOTE — Telephone Encounter (Signed)
Will fwd to Dr. Irish Lack for rec's re: holding Plavix. [name], PA-C    06/05/2020 5:10 PM

## 2020-06-05 NOTE — Progress Notes (Signed)
06/05/2020 Brandon [name] [id number] 05/14/1955   HISTORY OF PRESENT ILLNESS: This is a pleasant 65 year old male who is new to our office.  He is here today to discuss and schedule colonoscopy.  He has never had one in the past.  He tells me that his father died of colon cancer.  He denies seeing any blood in his stool.  He says that he moves his bowels regularly.  He is on Plavix for history of coronary artery disease with remote CABG and stents placed.  He follows with Dr. Irish Lack of cardiology here in [place].  He tells me that he was having some issues with chest pressure in the past and if he would sit upright and belch several times it would improve.  This had gone on for quite some time.  He had a clear cardiac cath back in 2017 or 2018.  He also described some intermittent issues with food going down very slowly.  His PCP placed him on omeprazole 20 mg daily and he thinks that that is a miracle drug.  He says that it has worked Recruitment consultant for him and that only happens maybe once a year at this point.  He denies any overt heartburn or reflux.  He denies foods actually getting stuck, said when that happens it just seems like the food is slow to go down.   Past Medical History:  Diagnosis Date  . Arthritis   . Basal cell carcinoma   . CAD (coronary artery disease) 06/14/2018   Cath on 01/23/2014 with LAD occlusion  . Essential hypertension 06/08/2018  . Gastroesophageal reflux disease 06/08/2018  . GERD (gastroesophageal reflux disease)   . Heart disease   . Heart failure with reduced ejection fraction ([place]) 06/14/2018   ECHO 01/22/2014 with EF 40-45%  . History of chicken pox   . History of migraine   . Hx of basal cell carcinoma 06/08/2018   04/2017. Left jaw  . Hx of CABG   . Hyperlipidemia   . Inguinal hernia   . Mixed hyperlipidemia 06/08/2018  . Tobacco use disorder 06/08/2018   Past Surgical History:  Procedure Laterality Date  . APPENDECTOMY  1960  . CORONARY  ARTERY BYPASS GRAFT     x 3  . INGUINAL HERNIA REPAIR  2008  . SKIN CANCER EXCISION    . Stent placed     in 2005 and in 2014    reports that he quit smoking about 13 months ago. His smoking use included cigarettes. He has a 60.00 pack-year smoking history. He quit smokeless tobacco use about 17 years ago.  His smokeless tobacco use included chew. He reports current alcohol use. He reports current drug use. Drug: Marijuana. family history includes Colon cancer (age of onset: 83) in his father; Colon polyps in his sister; Dementia in his mother; Diabetes in his father; Heart attack in his maternal grandfather and maternal grandmother; Heart attack (age of onset: 14) in his mother; Heart attack (age of onset: 32) in his brother; Heart disease in his mother; Prostate cancer in his paternal grandfather; Stroke (age of onset: 67) in his paternal grandmother. Allergies  Allergen Reactions  . Antihistamines, Loratadine-Type Hives    Any kind of antihistamines-hallucination, swelling up, itching, rash  . Penicillins Other (See Comments)    Throat swelling up, hallucinations.  . Shellfish Allergy Swelling    More as a kid, and now mild reaction-itchy eyes and nose  . Iodine Nausea And Vomiting    Really  nauseas and vomited.      Outpatient Encounter Medications as of 06/05/2020  Medication Sig  . amLODipine (NORVASC) 5 MG tablet TAKE 1 TABLET BY MOUTH EVERY DAY  . aspirin EC 81 MG tablet Take 1 tablet (81 mg total) by mouth daily.  . clopidogrel (PLAVIX) 75 MG tablet TAKE 1 TABLET BY MOUTH EVERY DAY  . ezetimibe (ZETIA) 10 MG tablet Take 1 tablet (10 mg total) by mouth daily.  . fenofibrate (TRICOR) 145 MG tablet Take 1 tablet (145 mg total) by mouth daily.  . metoprolol tartrate (LOPRESSOR) 25 MG tablet TAKE 1 TABLET BY MOUTH TWICE A DAY  . Multiple Vitamin (MULTIVITAMIN) tablet Take 1 tablet by mouth daily.  . nitroGLYCERIN (NITROSTAT) 0.4 MG SL tablet Place 1 tablet (0.4 mg total) under the  tongue every 5 (five) minutes as needed for chest pain.  Marland Kitchen omeprazole (PRILOSEC) 20 MG capsule Take 1 capsule (20 mg total) by mouth daily.  . predniSONE (DELTASONE) 10 MG tablet Take 3 pills once daily by mouth for 3 days, then 2 pills once daily for 3 days, then 1 pill once daily for 3 days and then stop  . rosuvastatin (CRESTOR) 40 MG tablet Take 1 tablet (40 mg total) by mouth daily.   No facility-administered encounter medications on file as of 06/05/2020.     REVIEW OF SYSTEMS  : All other systems reviewed and negative except where noted in the History of Present Illness.   PHYSICAL EXAM: BP (!) 142/82   Pulse 70   Ht 5\' 8"  (1.727 m)   Wt 215 lb (97.5 kg)   BMI 32.69 kg/m  General: Well developed white male in no acute distress Head: Normocephalic and atraumatic Eyes:  Sclerae anicteric, conjunctiva pink. Ears: Normal auditory acuity Lungs: Clear throughout to auscultation; no W/R/R. Heart: Regular rate and rhythm; no M/R/G. Abdomen: Soft, non-distended.  BS present.  Non-tender. Rectal:  Will be done at the time of colonoscopy. Musculoskeletal: Symmetrical with no gross deformities  Skin: No lesions on visible extremities Extremities: No edema  Neurological: Alert oriented x 4, grossly non-focal Psychological:  Alert and cooperative. Normal mood and affect  ASSESSMENT AND PLAN: *Screening colonoscopy/FH of colon cancer:  Never had colonoscopy in the past.  Father died of colon cancer.  Will plan for colonoscopy with Dr. Bryan Lemma. *Atypical chest pain, belching, occasional mild dysphagia:  Much improved now over the past year since on omeprazole 20 mg daily.  Has never had an EGD in the past.  Will plan for that as well (pending insurance coverage per patient).   *Chronic antiplatelet use with Plavix due to history of coronary artery disease with previous CABG and stents remotely:  Hold Plavix for 5 days before procedure - will instruct when and how to resume after  procedure. Risks and benefits of procedure including bleeding, perforation, infection, missed lesions, medication reactions and possible hospitalization or surgery if complications occur explained. Additional rare but real risk of cardiovascular event such as heart attack or ischemia/infarct of other organs off of Plavix explained and need to seek urgent help if this occurs. Will communicate by phone or EMR with patient's prescribing provider, Dr. Irish Lack, to confirm that holding Plavix is reasonable in this case.   **The risks, benefits, and alternatives to EGD and colonoscopy were discussed with the patient and he consents to proceed.   CC:  Lesleigh Noe, MD

## 2020-06-06 NOTE — Telephone Encounter (Signed)
   Primary Cardiologist: Larae Grooms, MD  Chart reviewed as part of pre-operative protocol coverage. Given past medical history and time since last visit, based on ACC/AHA guidelines, Brandon [name] would be at acceptable risk for the planned procedure without further cardiovascular testing.   Per Dr. Irish Lack, patient may hold Plavix 5 days prior to procedure and resume when ok from a bleeding standpoint per surgical team.   I will route this recommendation to the requesting party via [employer] fax function and remove from pre-op pool.  Please call with questions.  Kathyrn Drown, NP 06/06/2020, 1:42 PM

## 2020-06-06 NOTE — Telephone Encounter (Signed)
OK to hold plavix 5 days prior to endoscopy

## 2020-06-07 ENCOUNTER — Telehealth: Payer: Self-pay | Admitting: Gastroenterology

## 2020-06-07 NOTE — Telephone Encounter (Signed)
Inbound call from patient stating Clenpiq is over $100 which he cannot afford and is requesting alternate prep be sent to pharmacy or if we have samples in office.  Please advise.

## 2020-06-10 NOTE — Progress Notes (Signed)
Agree with the assessment and plan as outlined by Jessica Zehr, PA-C. ? ?Maycel Riffe, DO, FACG ? ?

## 2020-06-13 NOTE — Telephone Encounter (Signed)
Patient informed that we will have a sample available on Monday 2/28 for him to pick up.

## 2020-06-13 NOTE — Telephone Encounter (Signed)
Patient informed to hold plavix.

## 2020-06-17 NOTE — Telephone Encounter (Signed)
Sample placed up front for patient to pick up.

## 2020-06-27 ENCOUNTER — Other Ambulatory Visit: Payer: Self-pay | Admitting: Family Medicine

## 2020-07-12 ENCOUNTER — Ambulatory Visit (AMBULATORY_SURGERY_CENTER): Payer: Medicare HMO | Admitting: Gastroenterology

## 2020-07-12 ENCOUNTER — Encounter: Payer: Self-pay | Admitting: Gastroenterology

## 2020-07-12 ENCOUNTER — Other Ambulatory Visit: Payer: Self-pay

## 2020-07-12 VITALS — BP 124/68 | HR 55 | Temp 96.9°F | Resp 21 | Ht 68.0 in | Wt 215.0 lb

## 2020-07-12 DIAGNOSIS — Z1211 Encounter for screening for malignant neoplasm of colon: Secondary | ICD-10-CM

## 2020-07-12 DIAGNOSIS — Z7902 Long term (current) use of antithrombotics/antiplatelets: Secondary | ICD-10-CM

## 2020-07-12 DIAGNOSIS — D125 Benign neoplasm of sigmoid colon: Secondary | ICD-10-CM

## 2020-07-12 DIAGNOSIS — D122 Benign neoplasm of ascending colon: Secondary | ICD-10-CM | POA: Diagnosis not present

## 2020-07-12 DIAGNOSIS — D124 Benign neoplasm of descending colon: Secondary | ICD-10-CM

## 2020-07-12 DIAGNOSIS — D120 Benign neoplasm of cecum: Secondary | ICD-10-CM | POA: Diagnosis not present

## 2020-07-12 DIAGNOSIS — I509 Heart failure, unspecified: Secondary | ICD-10-CM | POA: Diagnosis not present

## 2020-07-12 DIAGNOSIS — I2510 Atherosclerotic heart disease of native coronary artery without angina pectoris: Secondary | ICD-10-CM | POA: Diagnosis not present

## 2020-07-12 DIAGNOSIS — D123 Benign neoplasm of transverse colon: Secondary | ICD-10-CM

## 2020-07-12 DIAGNOSIS — K5730 Diverticulosis of large intestine without perforation or abscess without bleeding: Secondary | ICD-10-CM

## 2020-07-12 DIAGNOSIS — R0789 Other chest pain: Secondary | ICD-10-CM

## 2020-07-12 DIAGNOSIS — K641 Second degree hemorrhoids: Secondary | ICD-10-CM

## 2020-07-12 DIAGNOSIS — Z800 Family history of malignant neoplasm of digestive organs: Secondary | ICD-10-CM | POA: Diagnosis not present

## 2020-07-12 MED ORDER — SODIUM CHLORIDE 0.9 % IV SOLN: 500.0000 mL | Freq: Once | INTRAVENOUS | Status: DC

## 2020-07-12 NOTE — Progress Notes (Signed)
Called to room to assist during endoscopic procedure.  Patient ID and intended procedure confirmed with present staff. Received instructions for my participation in the procedure from the performing physician.  

## 2020-07-12 NOTE — Progress Notes (Signed)
Report given to PACU, vss 

## 2020-07-12 NOTE — Progress Notes (Signed)
Medical history reviewed with no changes noted. VS assessed by C.W 

## 2020-07-12 NOTE — Op Note (Signed)
[place] Patient Name: Brandon [name] Procedure Date: 07/12/2020 10:11 AM MRN: [id number] Endoscopist: Gerrit Heck , MD Age: 65 Referring MD:  Date of Birth: 1956/01/30 Gender: Male Account #: [id number] Procedure:                Colonoscopy Indications:              Screening in patient at increased risk: Colorectal                            cancer in father 74 or older, This is the patient's                            first colonoscopy Medicines:                Monitored Anesthesia Care Procedure:                Pre-Anesthesia Assessment:                           - Prior to the procedure, a History and Physical                            was performed, and patient medications and                            allergies were reviewed. The patient's tolerance of                            previous anesthesia was also reviewed. The risks                            and benefits of the procedure and the sedation                            options and risks were discussed with the patient.                            All questions were answered, and informed consent                            was obtained. Prior Anticoagulants: The patient has                            taken Plavix (clopidogrel), last dose was 6 days                            prior to procedure. ASA Grade Assessment: III - A                            patient with severe systemic disease. After                            reviewing the risks and benefits, the patient was  deemed in satisfactory condition to undergo the                            procedure.                           After obtaining informed consent, the colonoscope                            was passed under direct vision. Throughout the                            procedure, the patient's blood pressure, pulse, and                            oxygen saturations were monitored continuously. The                             Olympus CF-HQ190L ([id number]) Colonoscope was                            introduced through the anus and advanced to the the                            terminal ileum. The colonoscopy was performed                            without difficulty. The patient tolerated the                            procedure well. The quality of the bowel                            preparation was good. The terminal ileum, ileocecal                            valve, appendiceal orifice, and rectum were                            photographed. Scope In: 10:21:14 AM Scope Out: 10:53:58 AM Scope Withdrawal Time: 0 hours 30 minutes 50 seconds  Total Procedure Duration: 0 hours 32 minutes 44 seconds  Findings:                 Hemorrhoids were found on perianal exam.                           Ten sessile polyps were found in the descending                            colon, transverse colon, ascending colon and cecum.                            The polyps were 3 to 12 mm in size. These polyps  were removed with a cold snare. Resection and                            retrieval were complete. Estimated blood loss was                            minimal.                           Two sessile polyps were found in the sigmoid colon.                            The polyps were 2 to 5 mm in size. These polyps                            were removed with a cold snare. Resection and                            retrieval were complete. Estimated blood loss was                            minimal.                           A few small-mouthed diverticula were found in the                            sigmoid colon.                           Non-bleeding internal hemorrhoids and hypertrophied                            anal papillae were found during retroflexion. The                            hemorrhoids were small and Grade II (internal                            hemorrhoids that prolapse but reduce  spontaneously).                           The terminal ileum appeared normal. Complications:            No immediate complications. Estimated Blood Loss:     Estimated blood loss was minimal. Impression:               - Hemorrhoids found on perianal exam.                           - Ten 3 to 12 mm polyps in the descending colon, in                            the transverse colon, in the ascending colon and in  the cecum, removed with a cold snare. Resected and                            retrieved.                           - Two 2 to 5 mm polyps in the sigmoid colon,                            removed with a cold snare. Resected and retrieved.                           - Diverticulosis in the sigmoid colon.                           - Non-bleeding internal hemorrhoids.                           - The examined portion of the ileum was normal. Recommendation:           - Patient has a contact number available for                            emergencies. The signs and symptoms of potential                            delayed complications were discussed with the                            patient. Return to normal activities tomorrow.                            Written discharge instructions were provided to the                            patient.                           - Resume previous diet.                           - Continue present medications.                           - Await pathology results.                           - Repeat colonoscopy in 1-3 years for surveillance                            based on pathology results.                           - Return to GI clinic PRN.                           -  Use fiber, for example Citrucel, Fibercon, Konsyl                            or Metamucil.                           - Internal hemorrhoids were noted on this study and                            may be amenable to hemorrhoid band ligation. If you                             are interested in further treatment of these                            hemorrhoids with band ligation, please contact my                            clinic to set up an appointment for evaluation and                            treatment. Gerrit Heck, MD 07/12/2020 11:00:39 AM

## 2020-07-12 NOTE — Patient Instructions (Addendum)
Handouts Provided:  Polyps, Diverticulosis and Hemorrhoid Banding  RESUME Plavix tomorrow at prior dose.   YOU HAD AN ENDOSCOPIC PROCEDURE TODAY AT [facility]:   Refer to the procedure report that was given to you for any specific questions about what was found during the examination.  If the procedure report does not answer your questions, please call your gastroenterologist to clarify.  If you requested that your care partner not be given the details of your procedure findings, then the procedure report has been included in a sealed envelope for you to review at your convenience later.  YOU SHOULD EXPECT: Some feelings of bloating in the abdomen. Passage of more gas than usual.  Walking can help get rid of the air that was put into your GI tract during the procedure and reduce the bloating. If you had a lower endoscopy (such as a colonoscopy or flexible sigmoidoscopy) you may notice spotting of blood in your stool or on the toilet paper. If you underwent a bowel prep for your procedure, you may not have a normal bowel movement for a few days.  Please Note:  You might notice some irritation and congestion in your nose or some drainage.  This is from the oxygen used during your procedure.  There is no need for concern and it should clear up in a day or so.  SYMPTOMS TO REPORT IMMEDIATELY:   Following lower endoscopy (colonoscopy or flexible sigmoidoscopy):  Excessive amounts of blood in the stool  Significant tenderness or worsening of abdominal pains  Swelling of the abdomen that is new, acute  Fever of 100F or higher  For urgent or emergent issues, a gastroenterologist can be reached at any hour by calling [phone]. Do not use MyChart messaging for urgent concerns.    DIET:  We do recommend a small meal at first, but then you may proceed to your regular diet.  Drink plenty of fluids but you should avoid alcoholic beverages for 24 hours.  ACTIVITY:  You should plan  to take it easy for the rest of today and you should NOT DRIVE or use heavy machinery until tomorrow (because of the sedation medicines used during the test).    FOLLOW UP: Our staff will call the number listed on your records 48-72 hours following your procedure to check on you and address any questions or concerns that you may have regarding the information given to you following your procedure. If we do not reach you, we will leave a message.  We will attempt to reach you two times.  During this call, we will ask if you have developed any symptoms of COVID 19. If you develop any symptoms (ie: fever, flu-like symptoms, shortness of breath, cough etc.) before then, please call [phone].  If you test positive for Covid 19 in the 2 weeks post procedure, please call and report this information to [place].    If any biopsies were taken you will be contacted by phone or by letter within the next 1-3 weeks.  Please call us at [phone] if you have not heard about the biopsies in 3 weeks.    SIGNATURES/CONFIDENTIALITY: You and/or your care partner have signed paperwork which will be entered into your electronic medical record.  These signatures attest to the fact that that the information above on your After Visit Summary has been reviewed and is understood.  Full responsibility of the confidentiality of this discharge information lies with you and/or your care-partner.

## 2020-07-16 ENCOUNTER — Telehealth: Payer: Self-pay | Admitting: *Deleted

## 2020-07-16 NOTE — Telephone Encounter (Signed)
  Follow up Call-  Call back number 07/12/2020  Post procedure Call Back phone  # [phone]  Permission to leave phone message Yes  Some recent data might be hidden     Patient questions:  Do you have a fever, pain , or abdominal swelling? No. Pain Score  0 *  Have you tolerated food without any problems? Yes.    Have you been able to return to your normal activities? Yes.    Do you have any questions about your discharge instructions: Diet   No. Medications  No. Follow up visit  No.  Do you have questions or concerns about your Care? No.  Actions: * If pain score is 4 or above: 1. No action needed, pain <4.Have you developed a fever since your procedure? no  2.   Have you had an respiratory symptoms (SOB or cough) since your procedure? no  3.   Have you tested positive for COVID 19 since your procedure no  4.   Have you had any family members/close contacts diagnosed with the COVID 19 since your procedure?  no   If yes to any of these questions please route to Joylene John, RN and Joella Prince, RN

## 2020-07-29 ENCOUNTER — Encounter: Payer: Self-pay | Admitting: Gastroenterology

## 2020-07-29 ENCOUNTER — Telehealth: Payer: Self-pay | Admitting: General Surgery

## 2020-07-29 NOTE — Telephone Encounter (Signed)
-----   Message from [place], DO sent at 07/29/2020  7:49 AM EDT ----- 11 of the polyps resected were Tubular Adenomas.  These types of polyps are considered benign, but precancerous.  These were removed at the time of colonoscopy.  Based on the number of adenomatous polyps (>10) and per current GI societal guidelines, I recommend the following:  - Repeat colonoscopy in 1 year for short interval surveillance - Referral to the [facility] for evaluation

## 2020-07-29 NOTE — Telephone Encounter (Signed)
Contacted the patient Per Dr Bryan Lemma, explained to the patient that due to the type of polyps and the amount it is recommended that he have a repeat colon in 1 year and to see a Dietitian. The patient was not happy that this all was not discussed with him prior to his colonoscopy and I explained we would not have known the pathology until after they were removed. The patient asked that we type a letter of all findings and recommendations and send it in his my chart. I expressed to the patient I would be glad to send it to him and if he had any additional questions to call our office.

## 2020-08-12 ENCOUNTER — Other Ambulatory Visit: Payer: Medicare HMO | Admitting: *Deleted

## 2020-08-12 ENCOUNTER — Other Ambulatory Visit: Payer: Self-pay

## 2020-08-12 DIAGNOSIS — E782 Mixed hyperlipidemia: Secondary | ICD-10-CM | POA: Diagnosis not present

## 2020-08-12 LAB — LIPID PANEL
Chol/HDL Ratio: 3.5 ratio (ref 0.0–5.0)
Cholesterol, Total: 120 mg/dL (ref 100–199)
HDL: 34 mg/dL — ABNORMAL LOW (ref >39)
LDL Chol Calc (NIH): 50 mg/dL (ref 0–99)
Triglycerides: 223 mg/dL — ABNORMAL HIGH (ref 0–149)
VLDL Cholesterol Cal: 36 mg/dL (ref 5–40)

## 2020-08-12 LAB — HEPATIC FUNCTION PANEL
ALT: 19 IU/L (ref 0–44)
AST: 19 IU/L (ref 0–40)
Albumin: 4.4 g/dL (ref 3.8–4.8)
Alkaline Phosphatase: 47 IU/L (ref 44–121)
Bilirubin Total: 0.5 mg/dL (ref 0.0–1.2)
Bilirubin, Direct: 0.16 mg/dL (ref 0.00–0.40)
Total Protein: 6.6 g/dL (ref 6.0–8.5)

## 2020-08-13 ENCOUNTER — Telehealth: Payer: Self-pay | Admitting: Pharmacist

## 2020-08-13 NOTE — Telephone Encounter (Signed)
Left message for pt to discuss lipid panel results. LDL now at goal < 55 since adding ezetimibe 10mg  daily to his rosuvastatin 40mg  daily. However, he was also started on fenofibrate 145mg  daily back in January which should have cut his triglycerides in half. They have only dropped from 248 to 223. Will need to see if pt is adherent to therapy or if he's had dietary indiscretion recently. Avoiding fish oil due to shellfish allergy (swelling).

## 2020-08-14 NOTE — Telephone Encounter (Signed)
Pt returned call. Reports adherence to rosuvastatin 40mg  daily, ezetimibe 10mg  daily, and fenofibrate 145mg  daily. Does report worsening diet and lack of exercise over the past few months along with weight gain. Started exercising again 3 weeks ago. Does drink 1/2 regular soda each day, candy bowl at night (started buying bananas to eat instead), likes potatoes/bread, and drinks 2-3 beers per night. Discussed switching to diet soda, cutting back 1 beer each night, and decreasing carb intake to help lower his TG. Avoiding fish oil due to his shellfish allergy. Pt aware to continue on his lipid meds since these are optimized, and will focus on reducing carbs/sugar/alcohol and increasing physical activity to help further lower his TG. His LDL is now at goal < 55 with addition of ezetimibe.

## 2020-09-20 ENCOUNTER — Other Ambulatory Visit: Payer: Self-pay

## 2020-09-20 NOTE — Telephone Encounter (Signed)
Pt needs a medication refill appt before filled, please.

## 2020-09-21 ENCOUNTER — Other Ambulatory Visit: Payer: Self-pay | Admitting: Family Medicine

## 2020-09-23 NOTE — Telephone Encounter (Signed)
Called patient and he is going out of town for 2 weeks. He said that he will call back to schedule his medication refill appt when he gets back. EM

## 2020-09-23 NOTE — Telephone Encounter (Signed)
Will you please schedule this pt for a med refill appt?

## 2020-10-01 NOTE — Telephone Encounter (Signed)
Patient has an appointment.

## 2020-10-03 ENCOUNTER — Other Ambulatory Visit: Payer: Self-pay

## 2020-10-03 ENCOUNTER — Ambulatory Visit (INDEPENDENT_AMBULATORY_CARE_PROVIDER_SITE_OTHER): Payer: Medicare HMO | Admitting: Family Medicine

## 2020-10-03 VITALS — BP 112/70 | HR 56 | Temp 97.0°F | Ht 68.0 in | Wt 209.2 lb

## 2020-10-03 DIAGNOSIS — Z951 Presence of aortocoronary bypass graft: Secondary | ICD-10-CM | POA: Diagnosis not present

## 2020-10-03 DIAGNOSIS — I10 Essential (primary) hypertension: Secondary | ICD-10-CM | POA: Diagnosis not present

## 2020-10-03 DIAGNOSIS — R69 Illness, unspecified: Secondary | ICD-10-CM | POA: Diagnosis not present

## 2020-10-03 DIAGNOSIS — I25118 Atherosclerotic heart disease of native coronary artery with other forms of angina pectoris: Secondary | ICD-10-CM

## 2020-10-03 DIAGNOSIS — K219 Gastro-esophageal reflux disease without esophagitis: Secondary | ICD-10-CM

## 2020-10-03 DIAGNOSIS — F17201 Nicotine dependence, unspecified, in remission: Secondary | ICD-10-CM

## 2020-10-03 DIAGNOSIS — E782 Mixed hyperlipidemia: Secondary | ICD-10-CM

## 2020-10-03 DIAGNOSIS — L309 Dermatitis, unspecified: Secondary | ICD-10-CM | POA: Diagnosis not present

## 2020-10-03 MED ORDER — TRIAMCINOLONE ACETONIDE 0.1 % EX CREA: 1.0000 "application " | TOPICAL_CREAM | Freq: Two times a day (BID) | CUTANEOUS | 0 refills | Status: DC

## 2020-10-03 MED ORDER — OMEPRAZOLE 20 MG PO CPDR: DELAYED_RELEASE_CAPSULE | ORAL | 3 refills | Status: DC

## 2020-10-03 NOTE — Assessment & Plan Note (Signed)
BP controlled. Cont amlodipine 5 mg and metoprolol 25 mg

## 2020-10-03 NOTE — Assessment & Plan Note (Signed)
Controlled on omeprazole 20 mg

## 2020-10-03 NOTE — Assessment & Plan Note (Signed)
Discussed candidate for lung cancer screening - cost important. Referral placed will defer to team for anticipated cost

## 2020-10-03 NOTE — Progress Notes (Signed)
Subjective:     Brandon [name] is a 65 y.o. male presenting for Medication Refill and Skin Problem (Itching on back between shoulder blades x 2 months )     Medication Refill   #HTN - taking medications - exercising regularly - has lost 9 lbs  - goal for 190 lbs  #GERD - will get some loose stools for a few days - then will do normal bowel habit for several days - works great to control heartburn symptoms - taking omeprazole 20 mg  #Rash - itching on the back - x 2 months - started when he was on vacation fishing in April 2022 - used some eczema treatment last week - no improvement - but also limited because he cannot place this on his back but had help last week  - worse first thing in the morning and then gets worse throughout they day     Review of Systems   Social History   Tobacco Use  Smoking Status Former   Packs/day: 1.50   Years: 40.00   Pack years: 60.00   Types: Cigarettes   Quit date: 04/21/2019   Years since quitting: 1.4  Smokeless Tobacco Former   Types: Chew   Quit date: 04/21/2003        Objective:    BP Readings from Last 3 Encounters:  10/03/20 112/70  07/12/20 124/68  06/05/20 (!) 142/82   Wt Readings from Last 3 Encounters:  10/03/20 209 lb 4 oz (94.9 kg)  07/12/20 215 lb (97.5 kg)  06/05/20 215 lb (97.5 kg)    BP 112/70   Pulse (!) 56   Temp (!) 97 F (36.1 C) (Temporal)   Ht 5\' 8"  (1.727 m)   Wt 209 lb 4 oz (94.9 kg)   SpO2 96%   BMI 31.82 kg/m    Physical Exam Constitutional:      Appearance: Normal appearance. He is not ill-appearing or diaphoretic.  HENT:     Right Ear: External ear normal.     Left Ear: External ear normal.  Eyes:     General: No scleral icterus.    Extraocular Movements: Extraocular movements intact.     Conjunctiva/sclera: Conjunctivae normal.  Cardiovascular:     Rate and Rhythm: Normal rate.  Pulmonary:     Effort: Pulmonary effort is normal.  Musculoskeletal:     Cervical back:  Neck supple.  Skin:    General: Skin is warm and dry.     Comments: Several moles on the back. Central area of erythema and some macular/papular lesions on the mid upper back and shoulder area.   Neurological:     Mental Status: He is alert. Mental status is at baseline.  Psychiatric:        Mood and Affect: Mood normal.          Assessment & Plan:   Problem List Items Addressed This Visit       Cardiovascular and Mediastinum   Essential hypertension - Primary    BP controlled. Cont amlodipine 5 mg and metoprolol 25 mg       CAD (coronary artery disease)    Follows with cardiology, appreciate support. Cont metoprolol and plavix         Digestive   Gastroesophageal reflux disease    Controlled on omeprazole 20 mg       Relevant Medications   omeprazole (PRILOSEC) 20 MG capsule     Musculoskeletal and Integument   Dermatitis    Suspect  possible eczema. Treat with steroids. Has dermatologist and advised f/u if not improving       Relevant Medications   triamcinolone cream (KENALOG) 0.1 %     Other   Mixed hyperlipidemia    Follows with cardiology. On zetia, fenofibrate, and crestor 40 mg. Denies side effects       Hx of CABG    Follows with cardiology. Doing well. Taking plavix and asa       Tobacco use disorder, mild, in sustained remission    Discussed candidate for lung cancer screening - cost important. Referral placed will defer to team for anticipated cost       Relevant Orders   Ambulatory Referral for Lung Cancer Scre     Return in about 1 year (around 10/03/2021) for annual physical .  Lesleigh Noe, MD  This visit occurred during the SARS-CoV-2 public health emergency.  Safety protocols were in place, including screening questions prior to the visit, additional usage of staff PPE, and extensive cleaning of exam room while observing appropriate contact time as indicated for disinfecting solutions.

## 2020-10-03 NOTE — Assessment & Plan Note (Signed)
Follows with cardiology, appreciate support. Cont metoprolol and plavix

## 2020-10-03 NOTE — Assessment & Plan Note (Signed)
Follows with cardiology. Doing well. Taking plavix and asa

## 2020-10-03 NOTE — Patient Instructions (Addendum)
#  Rash - Use Triamcinolone at least 1 time per day - if improving continue for 2-3 weeks or 2 days after it gets better  #tobacco - lung cancer screening referral  Have your cardiologist check your kidney function and let me know the results

## 2020-10-03 NOTE — Assessment & Plan Note (Signed)
Suspect possible eczema. Treat with steroids. Has dermatologist and advised f/u if not improving

## 2020-10-03 NOTE — Assessment & Plan Note (Signed)
Follows with cardiology. On zetia, fenofibrate, and crestor 40 mg. Denies side effects

## 2020-10-09 ENCOUNTER — Other Ambulatory Visit: Payer: Self-pay | Admitting: *Deleted

## 2020-10-09 DIAGNOSIS — Z87891 Personal history of nicotine dependence: Secondary | ICD-10-CM

## 2020-11-08 ENCOUNTER — Encounter: Payer: Self-pay | Admitting: Primary Care

## 2020-11-08 ENCOUNTER — Ambulatory Visit (INDEPENDENT_AMBULATORY_CARE_PROVIDER_SITE_OTHER)
Admission: RE | Admit: 2020-11-08 | Discharge: 2020-11-08 | Disposition: A | Discharge status: Home / Self Care | Payer: Medicare HMO | Source: Ambulatory Visit | Attending: Internal Medicine | Admitting: Internal Medicine

## 2020-11-08 ENCOUNTER — Other Ambulatory Visit: Payer: Self-pay

## 2020-11-08 ENCOUNTER — Ambulatory Visit (INDEPENDENT_AMBULATORY_CARE_PROVIDER_SITE_OTHER): Payer: Medicare HMO | Admitting: Primary Care

## 2020-11-08 VITALS — BP 120/78 | HR 66 | Ht 68.0 in | Wt 211.0 lb

## 2020-11-08 DIAGNOSIS — Z87891 Personal history of nicotine dependence: Secondary | ICD-10-CM

## 2020-11-08 NOTE — Progress Notes (Signed)
Shared Decision Making Visit Lung Cancer Screening Program [phone])   Eligibility: Age 65 y.o. Pack Years Smoking History Calculation 72 (# packs/per year x # years smoked) Recent History of coughing up blood  no Unexplained weight loss? no ( >Than 15 pounds within the last 6 months ) Prior History Lung / other cancer no (Diagnosis within the last 5 years already requiring surveillance chest CT Scans). Smoking Status Former Smoker Former Smokers: Years since quit: <1 year  Quit Date: October 2021  Visit Components: Discussion included one or more decision making aids. yes Discussion included risk/benefits of screening. yes Discussion included potential follow up diagnostic testing for abnormal scans. yes Discussion included meaning and risk of over diagnosis. yes Discussion included meaning and risk of False Positives. yes Discussion included meaning of total radiation exposure. yes  Counseling Included: Importance of adherence to annual lung cancer LDCT screening. yes Impact of comorbidities on ability to participate in the program. yes Ability and willingness to under diagnostic treatment. yes  Smoking Cessation Counseling: Current Smokers:  Discussed importance of smoking cessation. yes Information about tobacco cessation classes and interventions provided to patient. yes Patient provided with "ticket" for LDCT Scan. yes Symptomatic Patient. No  Counseling(Intermediate counseling: > three minutes) 99406 Diagnosis Code: Tobacco Use Z72.0 Asymptomatic Patient yes  Counseling (Intermediate counseling: > three minutes counseling) [id number] Former Smokers:  Discussed the importance of maintaining cigarette abstinence. yes Diagnosis Code: Personal History of Nicotine Dependence. X41.287 Information about tobacco cessation classes and interventions provided to patient. Yes Patient provided with "ticket" for LDCT Scan. yes Written Order for Lung Cancer Screening with LDCT placed in  [facility]pic. Yes (CT Chest Lung Cancer Screening Low Dose W/O CM) [id number] Z12.2-Screening of respiratory organs Z87.891-Personal history of nicotine dependence  I have spent 25 minutes of face to face time with Mr Kosh discussing the risks and benefits of lung cancer screening. We viewed a power point together that explained in detail the above noted topics. We paused at intervals to allow for questions to be asked and answered to ensure understanding.We discussed that the single most powerful action that hee can take to decrease his risk of developing lung cancer is to quit smoking. We discussed whether or not he is ready to commit to setting a quit date. We discussed options for tools to aid in quitting smoking including nicotine replacement therapy, non-nicotine medications, support groups, Quit Smart classes, and behavior modification. We discussed that often times setting smaller, more achievable goals, such as eliminating 1 cigarette a day for a week and then 2 cigarettes a day for a week can be helpful in slowly decreasing the number of cigarettes smoked. This allows for a sense of accomplishment as well as providing a clinical benefit. I gave him the " Be Stronger Than Your Excuses" card with contact information for community resources, classes, free nicotine replacement therapy, and access to mobile apps, text messaging, and on-line smoking cessation help. I have also given him Magnus Sinning card and contact information in the event he needs to contact her. We discussed the time and location of the scan, and that either Doroteo Glassman RN or I will call with the results within 24-48 hours of receiving them. I have offered him  a copy of the power point we viewed  as a resource in the event they need reinforcement of the concepts we discussed today in the office. The patient verbalized understanding of all of  the above and had no further questions upon leaving  the office. They have my contact information in  the event they have any further questions.  I spent 3 minutes counseling on smoking cessation and the health risks of continued tobacco abuse.  I explained to the patient that there has been a high incidence of coronary artery disease noted on these exams. I explained that this is a non-gated exam therefore degree or severity cannot be determined. This patient is on statin therapy. I have asked the patient to follow-up with their PCP regarding any incidental finding of coronary artery disease and management with diet or medication as their PCP  feels is clinically indicated. The patient verbalized understanding of the above and had no further questions upon completion of the visit.     Martyn Ehrich, NP

## 2020-11-08 NOTE — Patient Instructions (Signed)
Thank you for participating in the [facility]. It was our pleasure to meet you today. We will call you with the results of your scan within the next few days. Your scan will be assigned a Lung RADS category score by the physicians reading the scans.  This Lung RADS score determines follow up scanning.  See below for description of categories, and follow up screening recommendations. We will be in touch to schedule your follow up screening annually or based on recommendations of our providers. We will fax a copy of your scan results to your Primary Care Physician, or the physician who referred you to the program, to ensure they have the results. Please call the office if you have any questions or concerns regarding your scanning experience or results.  Our office number is [phone]. Please speak with Denise Phelps, RN. She is our Lung Cancer Screening RN. If she is unavailable when you call, please have the office staff send her a message. She will return your call at her earliest convenience. Remember, if your scan is normal, we will scan you annually as long as you continue to meet the criteria for the program. (Age 55-77, Current smoker or smoker who has quit within the last 15 years). If you are a smoker, remember, quitting is the single most powerful action that you can take to decrease your risk of lung cancer and other pulmonary, breathing related problems. We know quitting is hard, and we are here to help.  Please let us know if there is anything we can do to help you meet your goal of quitting. If you are a former smoker, congratulations. We are proud of you! Remain smoke free! Remember you can refer friends or family members through the number above.  We will screen them to make sure they meet criteria for the program. Thank you for helping us take better care of you by participating in Lung Screening.  Lung RADS Categories:  Lung RADS 1: no nodules  or definitely non-concerning nodules.  Recommendation is for a repeat annual scan in 12 months.  Lung RADS 2:  nodules that are non-concerning in appearance and behavior with a very low likelihood of becoming an active cancer. Recommendation is for a repeat annual scan in 12 months.  Lung RADS 3: nodules that are probably non-concerning , includes nodules with a low likelihood of becoming an active cancer.  Recommendation is for a 6-month repeat screening scan. Often noted after an upper respiratory illness. We will be in touch to make sure you have no questions, and to schedule your 6-month scan.  Lung RADS 4 A: nodules with concerning findings, recommendation is most often for a follow up scan in 3 months or additional testing based on our provider's assessment of the scan. We will be in touch to make sure you have no questions and to schedule the recommended 3 month follow up scan.  Lung RADS 4 B:  indicates findings that are concerning. We will be in touch with you to schedule additional diagnostic testing based on our provider's  assessment of the scan.   

## 2020-11-11 ENCOUNTER — Telehealth: Payer: Self-pay

## 2020-11-11 NOTE — Telephone Encounter (Signed)
Noted by triage.  

## 2020-11-11 NOTE — Telephone Encounter (Signed)
Received call report from [facility] with [facility] radiology.  CT report is available via epic.  Sarah, please advise. thanks

## 2020-11-14 ENCOUNTER — Other Ambulatory Visit: Payer: Self-pay | Admitting: Acute Care

## 2020-11-14 DIAGNOSIS — R911 Solitary pulmonary nodule: Secondary | ICD-10-CM

## 2020-11-14 NOTE — Progress Notes (Signed)
I have called the patient with the results of his low dose CT Chest. I explained there is a nodule that needs follow up. I explained I have spoken with Dr. Valeta Harms, and that we will plan to do a PET scan to further evaluate this area. Patient  is in agreement with this plan. He does want to know the co pay cost to him. HIs phone number is [phone], his email is [email or web address]. Rodena Piety, please use these contacts if you need to talk with him about cost. Thanks so much  He lives in [place]. Please change the site as needed for what is most convenient to him. Thanks so much

## 2020-11-19 ENCOUNTER — Telehealth: Payer: Self-pay | Admitting: Pulmonary Disease

## 2020-11-19 NOTE — Telephone Encounter (Signed)
Staff messages received: Margrett Rud, NP; Valerie Salts, CMA; Franchon Ketterman, Waldemar Dickens, CMA Cherina/Khila Papp   His pet is already scheduled on 8/11.   Please schedule with me to review PET and next steps for possible bx vs surgery   I have nodule slots on the 24th I believe.   Thanks   Leory Plowman         Previous Messages    ----- Message -----  From: Magdalen Spatz, NP  Sent: 11/11/2020   6:48 PM EDT  To: Garner Nash, DO  Subject: Can you look at tis nodule                     This patient 's scan was read as a Lung RADS 4 A : suspicious findings, either short term follow up in 3 months or alternatively  PET Scan evaluation may be considered when there is a solid component of  8 mm or larger. It is 13.4 mm and it is cavitary. Do you think it is ok to wait 3 months, or should we PET this now? Let me know your thoughts. Thanks so much     Called and spoke with pt to see if I could get him scheduled for a consult appt with Dr. Valeta Harms on either 8/22 at [place] or 8/24 at 9:30. Pt said that he needed to look at a calendar to see if either of those dates worked and would call back. Will wait for a return call.

## 2020-11-19 NOTE — Telephone Encounter (Signed)
Pt called back and appt was scheduled for pt with BI. Nothing further needed.

## 2020-11-26 DIAGNOSIS — D1801 Hemangioma of skin and subcutaneous tissue: Secondary | ICD-10-CM | POA: Diagnosis not present

## 2020-11-26 DIAGNOSIS — D1722 Benign lipomatous neoplasm of skin and subcutaneous tissue of left arm: Secondary | ICD-10-CM | POA: Diagnosis not present

## 2020-11-26 DIAGNOSIS — D225 Melanocytic nevi of trunk: Secondary | ICD-10-CM | POA: Diagnosis not present

## 2020-11-26 DIAGNOSIS — L821 Other seborrheic keratosis: Secondary | ICD-10-CM | POA: Diagnosis not present

## 2020-11-28 ENCOUNTER — Other Ambulatory Visit: Payer: Self-pay

## 2020-11-28 ENCOUNTER — Ambulatory Visit
Admission: RE | Admit: 2020-11-28 | Discharge: 2020-11-28 | Disposition: A | Discharge status: Home / Self Care | Payer: Medicare HMO | Source: Ambulatory Visit | Attending: Acute Care | Admitting: Acute Care

## 2020-11-28 DIAGNOSIS — R911 Solitary pulmonary nodule: Secondary | ICD-10-CM | POA: Diagnosis not present

## 2020-11-28 DIAGNOSIS — K5730 Diverticulosis of large intestine without perforation or abscess without bleeding: Secondary | ICD-10-CM | POA: Diagnosis not present

## 2020-11-28 DIAGNOSIS — E041 Nontoxic single thyroid nodule: Secondary | ICD-10-CM | POA: Diagnosis not present

## 2020-11-28 DIAGNOSIS — I700 Atherosclerosis of aorta: Secondary | ICD-10-CM | POA: Diagnosis not present

## 2020-11-28 LAB — GLUCOSE, CAPILLARY: Glucose-Capillary: 117 mg/dL — ABNORMAL HIGH (ref 70–99)

## 2020-11-28 MED ORDER — FLUDEOXYGLUCOSE F - 18 (FDG) INJECTION
11.2700 | Freq: Once | INTRAVENOUS | Status: AC | PRN
  Administered 2020-11-28: 11.27 via INTRAVENOUS

## 2020-12-05 ENCOUNTER — Telehealth: Payer: Self-pay | Admitting: Family Medicine

## 2020-12-05 DIAGNOSIS — E041 Nontoxic single thyroid nodule: Secondary | ICD-10-CM

## 2020-12-05 NOTE — Telephone Encounter (Signed)
Please call pt  Notified by pulmonary team that he had a thyroid nodule. Recommendation is an ultrasound to get a better look.   I've ordered the ultrasound for him.

## 2020-12-05 NOTE — Progress Notes (Signed)
I have spoken to the patient about the results of the PET scan. He has follow up with Dr. Valeta Harms 12/11/2020.  There was notation of a thyroid nodule. It was not hypermetabolic on PET.  There is a family Hx. Of hyperthyroidism ( Sister)  Dr. Einar Pheasant, please follow up as you feel is appropriate.

## 2020-12-06 NOTE — Telephone Encounter (Signed)
Left a detailed VM for pt letting him know that an ultrasound has been ordered and he will hear from someone to schedule. Requested call back if he has any questions.

## 2020-12-11 ENCOUNTER — Other Ambulatory Visit: Payer: Self-pay

## 2020-12-11 ENCOUNTER — Encounter: Payer: Self-pay | Admitting: Pulmonary Disease

## 2020-12-11 ENCOUNTER — Ambulatory Visit: Payer: Medicare HMO | Admitting: Pulmonary Disease

## 2020-12-11 VITALS — BP 124/64 | HR 56 | Temp 98.5°F | Ht 68.0 in | Wt 211.2 lb

## 2020-12-11 DIAGNOSIS — J984 Other disorders of lung: Secondary | ICD-10-CM | POA: Diagnosis not present

## 2020-12-11 DIAGNOSIS — R911 Solitary pulmonary nodule: Secondary | ICD-10-CM

## 2020-12-11 DIAGNOSIS — Z87891 Personal history of nicotine dependence: Secondary | ICD-10-CM | POA: Diagnosis not present

## 2020-12-11 NOTE — Patient Instructions (Addendum)
Thank you for visiting Dr. Valeta Harms at [facility]. Today we recommend the following:  Orders Placed This Encounter  Procedures   Ambulatory referral to Cardiothoracic Surgery   Pulmonary function test   Referral to Dr. Kipp Brood.  His office will be calling to schedule.   Return in about 3 months (around 03/13/2021) for Dr. Valeta Harms.    Please do your part to reduce the spread of COVID-19.

## 2020-12-11 NOTE — Progress Notes (Signed)
Synopsis: Referred in August 2022 for lung nodule by Lesleigh Noe, MD  Subjective:   PATIENT ID: Brandon [name] GENDER: male DOB: Apr 30, 1955, MRN: [id number]  Chief Complaint  Patient presents with   Consult    Pt is being referred due to having a lung nodule.  Pt had a shared decision visit and the CT showed the nodule and then had a PET scan performed. States that he does have some SOB.     This is a 65 year old gentleman, past medical history of coronary disease, hypertension, GERD, heart failure with a reduced ejection fraction, hyperlipidemia, history of CABG.  Patient presents for evaluation of abnormal CT imaging.  Patient had a lung cancer screening CT on 11/08/2020.  This revealed a medial right lower lobe irregular cavity of 13.4 mm in size.  Also had a nuclear medicine PET scan on 11/28/2020.  This revealed a irregular shaped cavity with no significant hypermetabolic uptake but given the cavitary nature of the lesion and the cystic component to this it was felt to be concerned for a low-grade well-differentiated neoplasm.  Patient is a former smoker which quit in January 2021, 60+ pack years.   Past Medical History:  Diagnosis Date   Arthritis    Basal cell carcinoma    CAD (coronary artery disease) 06/14/2018   Cath on 01/23/2014 with LAD occlusion   Essential hypertension 06/08/2018   Gastroesophageal reflux disease 06/08/2018   GERD (gastroesophageal reflux disease)    Heart disease    Heart failure with reduced ejection fraction ([place]) 06/14/2018   ECHO 01/22/2014 with EF 40-45%   History of chicken pox    History of migraine    Hx of basal cell carcinoma 06/08/2018   04/2017. Left jaw   Hx of CABG    Hyperlipidemia    Inguinal hernia    Mixed hyperlipidemia 06/08/2018   Tobacco use disorder 06/08/2018     Family History  Problem Relation Age of Onset   Dementia Mother    Heart attack Mother 67   Heart disease Mother    Colon cancer Father 81   Diabetes Father     Colon polyps Sister    Heart attack Brother 56   Heart attack Maternal Grandmother    Heart attack Maternal Grandfather    Stroke Paternal Grandmother 70   Prostate cancer Paternal Grandfather      Past Surgical History:  Procedure Laterality Date   APPENDECTOMY  1960   CORONARY ARTERY BYPASS GRAFT     x 3   INGUINAL HERNIA REPAIR  2008   SKIN CANCER EXCISION     Stent placed     in 2005 and in 2014    Social History   Socioeconomic History   Marital status: Divorced    Spouse name: Not on file   Number of children: 0   Years of education: Not on file   Highest education level: Not on file  Occupational History   Not on file  Tobacco Use   Smoking status: Former    Packs/day: 1.50    Years: 40.00    Pack years: 60.00    Types: Cigarettes    Start date: 22    Quit date: 04/21/2019    Years since quitting: 1.6   Smokeless tobacco: Former    Types: Chew    Quit date: 04/21/2003  Vaping Use   Vaping Use: Never used  Substance and Sexual Activity   Alcohol use: Yes    Comment:  2 to 3 beers daily   Drug use: Yes    Types: Marijuana    Comment: once a week   Sexual activity: Yes    Birth control/protection: Post-menopausal  Other Topics Concern   Not on file  Social History Narrative   06/15/19   From: [place]: alone, moved to help care for his mother   Work: unemployeed      Family: Mother still living, sister is nearby - Jenny Reichmann      Enjoys: fishing      Exercise: not currently   Diet: eating more than he should, trying to reduce sweets      Safety   Seat belts: Yes    Guns: Yes    Safe in relationships: Yes    Social Determinants of Radio broadcast assistant Strain: Not on file  Food Insecurity: Not on file  Transportation Needs: Not on file  Physical Activity: Not on file  Stress: Not on file  Social Connections: Not on file  Intimate Partner Violence: Not on file     Allergies  Allergen Reactions   Antihistamines,  Loratadine-Type Hives    Any kind of antihistamines-hallucination, swelling up, itching, rash   Penicillins Other (See Comments)    Throat swelling up, hallucinations.   Shellfish Allergy Swelling    More as a kid, and now mild reaction-itchy eyes and nose   Iodine Nausea And Vomiting    Really nauseas and vomited.     Outpatient Medications Prior to Visit  Medication Sig Dispense Refill   amLODipine (NORVASC) 5 MG tablet TAKE 1 TABLET BY MOUTH EVERY DAY 90 tablet 3   aspirin EC 81 MG tablet Take 1 tablet (81 mg total) by mouth daily. 90 tablet 3   clopidogrel (PLAVIX) 75 MG tablet TAKE 1 TABLET BY MOUTH EVERY DAY 90 tablet 3   ezetimibe (ZETIA) 10 MG tablet Take 1 tablet (10 mg total) by mouth daily. 90 tablet 3   fenofibrate (TRICOR) 145 MG tablet Take 1 tablet (145 mg total) by mouth daily. 90 tablet 3   metoprolol tartrate (LOPRESSOR) 25 MG tablet TAKE 1 TABLET BY MOUTH TWICE A DAY 180 tablet 3   Multiple Vitamin (MULTIVITAMIN) tablet Take 1 tablet by mouth daily.     nitroGLYCERIN (NITROSTAT) 0.4 MG SL tablet Place 1 tablet (0.4 mg total) under the tongue every 5 (five) minutes as needed for chest pain. 25 tablet 3   omeprazole (PRILOSEC) 20 MG capsule TAKE 1 CAPSULE BY MOUTH EVERY DAY 90 capsule 3   rosuvastatin (CRESTOR) 40 MG tablet Take 1 tablet (40 mg total) by mouth daily. 90 tablet 3   triamcinolone cream (KENALOG) 0.1 % Apply 1 application topically 2 (two) times daily. 30 g 0   No facility-administered medications prior to visit.    Review of Systems  Constitutional:  Negative for chills, fever, malaise/fatigue and weight loss.  HENT:  Negative for hearing loss, sore throat and tinnitus.   Eyes:  Negative for blurred vision and double vision.  Respiratory:  Positive for shortness of breath. Negative for cough, hemoptysis, sputum production, wheezing and stridor.   Cardiovascular:  Negative for chest pain, palpitations, orthopnea, leg swelling and PND.  Gastrointestinal:   Negative for abdominal pain, constipation, diarrhea, heartburn, nausea and vomiting.  Genitourinary:  Negative for dysuria, hematuria and urgency.  Musculoskeletal:  Negative for joint pain and myalgias.  Skin:  Negative for itching and rash.  Neurological:  Negative for dizziness,  tingling, weakness and headaches.  Endo/Heme/Allergies:  Negative for environmental allergies. Does not bruise/bleed easily.  Psychiatric/Behavioral:  Negative for depression. The patient is not nervous/anxious and does not have insomnia.   All other systems reviewed and are negative.   Objective:  Physical Exam Vitals reviewed.  Constitutional:      General: He is not in acute distress.    Appearance: He is well-developed. He is obese.  HENT:     Head: Normocephalic and atraumatic.  Eyes:     General: No scleral icterus.    Conjunctiva/sclera: Conjunctivae normal.     Pupils: Pupils are equal, round, and reactive to light.  Neck:     Vascular: No JVD.     Trachea: No tracheal deviation.  Cardiovascular:     Rate and Rhythm: Normal rate and regular rhythm.     Heart sounds: Normal heart sounds. No murmur heard. Pulmonary:     Effort: Pulmonary effort is normal. No tachypnea, accessory muscle usage or respiratory distress.     Breath sounds: No stridor. No wheezing, rhonchi or rales.  Abdominal:     General: Bowel sounds are normal. There is no distension.     Palpations: Abdomen is soft.     Tenderness: There is no abdominal tenderness.  Musculoskeletal:        General: No tenderness.     Cervical back: Neck supple.  Lymphadenopathy:     Cervical: No cervical adenopathy.  Skin:    General: Skin is warm and dry.     Capillary Refill: Capillary refill takes less than 2 seconds.     Findings: No rash.  Neurological:     Mental Status: He is alert and oriented to person, place, and time.  Psychiatric:        Behavior: Behavior normal.     Vitals:   12/11/20 0926  BP: 124/64  Pulse: (!) 56   Temp: 98.5 F (36.9 C)  TempSrc: Oral  SpO2: 97%  Weight: 211 lb 3.2 oz (95.8 kg)  Height: 5\' 8"  (1.727 m)   97% on RA BMI Readings from Last 3 Encounters:  12/11/20 32.11 kg/m  11/08/20 32.08 kg/m  10/03/20 31.82 kg/m   Wt Readings from Last 3 Encounters:  12/11/20 211 lb 3.2 oz (95.8 kg)  11/08/20 211 lb (95.7 kg)  10/03/20 209 lb 4 oz (94.9 kg)     CBC    Component Value Date/Time   WBC 6.4 12/21/2018 0739   RBC 5.11 12/21/2018 0739   HGB 15.7 12/21/2018 0739   HCT 46.6 12/21/2018 0739   PLT 196 12/21/2018 0739   MCV 91 12/21/2018 0739   MCH 30.7 12/21/2018 0739   MCHC 33.7 12/21/2018 0739   RDW 13.0 12/21/2018 0739     Chest Imaging: Lung cancer screening CT July 2022: Right lower lobe cavitary lesion.  Nuclear medicine PET scan August 2022: Low-level PET uptake however very small lesion. The patient's images have been independently reviewed by me.    Pulmonary Functions Testing Results: No flowsheet data found.  FeNO:   Pathology:   Echocardiogram:   Heart Catheterization:     Assessment & Plan:     ICD-10-CM   1. Pulmonary nodule  R91.1 Ambulatory referral to Cardiothoracic Surgery    Pulmonary function test    2. Former smoker  Z87.891 Ambulatory referral to Cardiothoracic Surgery    3. Cavitary lesion of lung  J98.4       Discussion:  This is a 66 year old gentleman, abnormal  lung cancer screening CT, former smoker quit in January 2021.  He has not had PFTs before.  From a respiratory standpoint he has no significant complaints.  Today in the office we reviewed his lung cancer screening images as well as a nuclear medicine pet image  Plan: Patient is agreeable for consideration for surgical resection. I think he is a candidate based on his functional status currently. A suspect he will have relatively normal PFTs.  I have ordered PFTs. We discussed possibility of combination case with thoracic surgery. Referral placed to  cardiothoracic surgery, Dr. Kipp Brood. I have discussed case with him today.  Patient is on Plavix and we will have to address holding this after a case date has been set.    Current Outpatient Medications:    amLODipine (NORVASC) 5 MG tablet, TAKE 1 TABLET BY MOUTH EVERY DAY, Disp: 90 tablet, Rfl: 3   aspirin EC 81 MG tablet, Take 1 tablet (81 mg total) by mouth daily., Disp: 90 tablet, Rfl: 3   clopidogrel (PLAVIX) 75 MG tablet, TAKE 1 TABLET BY MOUTH EVERY DAY, Disp: 90 tablet, Rfl: 3   ezetimibe (ZETIA) 10 MG tablet, Take 1 tablet (10 mg total) by mouth daily., Disp: 90 tablet, Rfl: 3   fenofibrate (TRICOR) 145 MG tablet, Take 1 tablet (145 mg total) by mouth daily., Disp: 90 tablet, Rfl: 3   metoprolol tartrate (LOPRESSOR) 25 MG tablet, TAKE 1 TABLET BY MOUTH TWICE A DAY, Disp: 180 tablet, Rfl: 3   Multiple Vitamin (MULTIVITAMIN) tablet, Take 1 tablet by mouth daily., Disp: , Rfl:    nitroGLYCERIN (NITROSTAT) 0.4 MG SL tablet, Place 1 tablet (0.4 mg total) under the tongue every 5 (five) minutes as needed for chest pain., Disp: 25 tablet, Rfl: 3   omeprazole (PRILOSEC) 20 MG capsule, TAKE 1 CAPSULE BY MOUTH EVERY DAY, Disp: 90 capsule, Rfl: 3   rosuvastatin (CRESTOR) 40 MG tablet, Take 1 tablet (40 mg total) by mouth daily., Disp: 90 tablet, Rfl: 3   triamcinolone cream (KENALOG) 0.1 %, Apply 1 application topically 2 (two) times daily., Disp: 30 g, Rfl: 0  I spent 62 minutes dedicated to the care of this patient on the date of this encounter to include pre-visit review of records, face-to-face time with the patient discussing conditions above, post visit ordering of testing, clinical documentation with the electronic health record, making appropriate referrals as documented, and communicating necessary findings to members of the patients care team.   Garner Nash, [facility] 12/11/2020 10:04 AM

## 2020-12-12 ENCOUNTER — Telehealth: Payer: Self-pay | Admitting: Pulmonary Disease

## 2020-12-12 NOTE — Telephone Encounter (Signed)
I called and spoke to the patient told him until they order the specific test I would be unable to give him codes to check he stated he would wait till he saw the doc on 09/02 and ask then

## 2020-12-16 ENCOUNTER — Telehealth: Payer: Self-pay | Admitting: Pulmonary Disease

## 2020-12-16 DIAGNOSIS — R911 Solitary pulmonary nodule: Secondary | ICD-10-CM

## 2020-12-16 DIAGNOSIS — Z87891 Personal history of nicotine dependence: Secondary | ICD-10-CM

## 2020-12-16 NOTE — Telephone Encounter (Signed)
Dr. Valeta Harms office policy is 90 days after testing positive for doing a PFT. Is this okay for patient to wait?

## 2020-12-17 NOTE — Telephone Encounter (Signed)
Laury Deep, RN  You; Garner Nash, DO; [place], Lucile Crater, MD; Drue Second A 12 hours ago (9:13 PM)   Brandon [name],   We can def get him in the following week or week after if PFTs can be done.  For patient who are scheduled at the hospital that end up getting COVID, they just r/s the patient after 10 days from a + test.  (He doesn't need to be retested, he just cannot have symptoms).   Let us know once PFTS get re-scheduled for and we will get him back in to see Dr. Kipp Brood.   Thanks,  Trinna Post, Octavio Graves, DO  You; Laury Deep, RN; Lajuana Matte, MD 13 hours ago (8:04 PM)    FYI patient tested positive for covid.   Looks like his appt will need to be rescheduled with Dr. Kipp Brood to discuss combo case.   Maybe he can be seen the following week.   PFTs will be needed for pre-op clearance. Is there another option besides waiting 90 days because his surgery needs to be completed before then. Re test option?   Thanks   BLI     I have touched base with the director and chief of the office to determine when the earliest after testing positive can we do a PFT .  Once I hear back we will get patient scheduled.

## 2020-12-17 NOTE — Telephone Encounter (Signed)
Talked with Sherri over at [facility] she said the patient has to be symptom free for 10 days. I don't even see the covid test in the chart. When did he test positive?  Sherri said they would be able to get patient in at the hospital once he is 10 days free of symptoms , fever, cough, congestion, etc.

## 2020-12-18 NOTE — Telephone Encounter (Signed)
Called and spoke with patient. He stated that his symptoms started late last Friday 12/13/20 and progressed into Saturday 12/14/20. He was not able to get access to an at home test until Monday 12/16/20. Once he took the test on Monday 12/16/20, it came back as positive.   He is aware that we will try to get the PFT scheduled at the hospital. He wants to make sure his insurance will cover the PFT being done in the hospital.   PFT has been ordered with the information above. The PCCs will call the patient with the appt.   Will route to BI to make him aware.

## 2020-12-19 ENCOUNTER — Other Ambulatory Visit: Payer: Self-pay | Admitting: Family Medicine

## 2020-12-19 ENCOUNTER — Other Ambulatory Visit: Payer: Self-pay | Admitting: Interventional Cardiology

## 2020-12-19 DIAGNOSIS — L309 Dermatitis, unspecified: Secondary | ICD-10-CM

## 2020-12-20 ENCOUNTER — Encounter: Payer: Medicare HMO | Admitting: Thoracic Surgery (Cardiothoracic Vascular Surgery)

## 2020-12-20 NOTE — Telephone Encounter (Signed)
Last refilled on 10/03/20 and LOV 10/03/20. #30 g with 0 refill

## 2020-12-30 NOTE — Progress Notes (Signed)
[address]       [place] [address]             [phone]                    Tem[facility] Medical Record #[id number] Date of Birth: 04/18/1956  Referring: Garner Nash, DO Primary Care: Lesleigh Noe, MD Primary Cardiologist: Larae Grooms, MD  Chief Complaint:    Chief Complaint  Patient presents with   Lung Lesion    Surgical consult, Chest CT 11/08/20, PET Scan 11/18/20, PFT's 12/19/20    History of Present Illness:    Brandon [name] 65 y.o. male referred by Dr. Valeta Harms for surgical evaluation of a right lower lobe pulmonary nodule.  He was previously enrolled in the lung cancer screening program and was noted to have a change in this nodule.  He does have a significant smoking history but quit in January 2021.  He denies any shortness of breath, or neurologic symptoms.  His weight is also been stable.    Smoking Hx: former smoker which quit in January 2021, 60+ pack years.  CABG 2000, sent in 2005, 2015 [place] Lack is the cardiologiest [name]: At the time of surgery this patient's most appropriate activity status/level should be described as: [x]     0    Normal activity, no symptoms []     1    Restricted in physical strenuous activity but ambulatory, able to do out light work []     2    Ambulatory and capable of self care, unable to do work activities, up and about               >50 % of waking hours                              []     3    Only limited self care, in bed greater than 50% of waking hours []     4    Completely disabled, no self care, confined to bed or chair []     5    Moribund   Past Medical History:  Diagnosis Date   Arthritis    Basal cell carcinoma    CAD (coronary artery disease) 06/14/2018   Cath on 01/23/2014 with LAD occlusion   Essential hypertension 06/08/2018   Gastroesophageal reflux disease 06/08/2018   GERD (gastroesophageal reflux disease)    Heart disease    Heart failure with reduced  ejection fraction ([place]) 06/14/2018   ECHO 01/22/2014 with EF 40-45%   History of chicken pox    History of migraine    Hx of basal cell carcinoma 06/08/2018   04/2017. Left jaw   Hx of CABG    Hyperlipidemia    Inguinal hernia    Mixed hyperlipidemia 06/08/2018   Tobacco use disorder 06/08/2018    Past Surgical History:  Procedure Laterality Date   APPENDECTOMY  1960   CORONARY ARTERY BYPASS GRAFT     x 3   INGUINAL HERNIA REPAIR  2008   SKIN CANCER EXCISION     Stent placed     in 2005 and in 2014    Family History  Problem Relation Age of Onset   Dementia Mother    Heart attack Mother 70   Heart disease Mother    Colon cancer Father 69   Diabetes Father  Colon polyps Sister    Heart attack Brother 68   Heart attack Maternal Grandmother    Heart attack Maternal Grandfather    Stroke Paternal Grandmother 3   Prostate cancer Paternal Grandfather      Social History   Tobacco Use  Smoking Status Former   Packs/day: 1.50   Years: 40.00   Pack years: 60.00   Types: Cigarettes   Start date: 25   Quit date: 04/21/2019   Years since quitting: 1.7  Smokeless Tobacco Former   Types: Chew   Quit date: 04/21/2003    Social History   Substance and Sexual Activity  Alcohol Use Yes   Comment: 2 to 3 beers daily     Allergies  Allergen Reactions   Antihistamines, Loratadine-Type Hives    Any kind of antihistamines-hallucination, swelling up, itching, rash   Penicillins Other (See Comments)    Throat swelling up, hallucinations.   Shellfish Allergy Swelling    More as a kid, and now mild reaction-itchy eyes and nose   Iodine Nausea And Vomiting    Really nauseas and vomited.    Current Outpatient Medications  Medication Sig Dispense Refill   amLODipine (NORVASC) 5 MG tablet TAKE 1 TABLET BY MOUTH EVERY DAY 90 tablet 3   aspirin EC 81 MG tablet Take 1 tablet (81 mg total) by mouth daily. 90 tablet 3   clopidogrel (PLAVIX) 75 MG tablet TAKE 1 TABLET BY MOUTH  EVERY DAY 90 tablet 3   ezetimibe (ZETIA) 10 MG tablet Take 1 tablet (10 mg total) by mouth daily. 90 tablet 3   fenofibrate (TRICOR) 145 MG tablet Take 1 tablet (145 mg total) by mouth daily. 90 tablet 3   metoprolol tartrate (LOPRESSOR) 25 MG tablet TAKE 1 TABLET BY MOUTH TWICE A DAY 180 tablet 3   Multiple Vitamin (MULTIVITAMIN) tablet Take 1 tablet by mouth daily.     nitroGLYCERIN (NITROSTAT) 0.4 MG SL tablet Place 1 tablet (0.4 mg total) under the tongue every 5 (five) minutes as needed for chest pain. 25 tablet 3   omeprazole (PRILOSEC) 20 MG capsule TAKE 1 CAPSULE BY MOUTH EVERY DAY 90 capsule 3   rosuvastatin (CRESTOR) 40 MG tablet TAKE 1 TABLET BY MOUTH EVERY DAY (STOP ATORVASTATIN) 30 tablet 0   triamcinolone cream (KENALOG) 0.1 % APPLY TO AFFECTED AREA TWICE A DAY 30 g 0   No current facility-administered medications for this visit.    Review of Systems  Constitutional: Negative.   Respiratory: Negative.    Cardiovascular: Negative.   Neurological: Negative.     PHYSICAL EXAMINATION: BP 131/75   Pulse 63   Resp 20   Ht 5\' 8"  (1.727 m)   Wt 210 lb (95.3 kg)   SpO2 96% Comment: RA  BMI 31.93 kg/m  Physical Exam Constitutional:      General: He is not in acute distress.    Appearance: Normal appearance. He is normal weight. He is not ill-appearing.  Eyes:     Extraocular Movements: Extraocular movements intact.  Cardiovascular:     Rate and Rhythm: Normal rate.     Heart sounds: No murmur heard. Pulmonary:     Effort: No respiratory distress.  Abdominal:     General: Abdomen is flat. There is no distension.  Musculoskeletal:        General: Normal range of motion.     Cervical back: Normal range of motion.  Skin:    General: Skin is warm and dry.  Neurological:  General: No focal deficit present.     Mental Status: He is alert and oriented to person, place, and time.    Diagnostic Studies & Laboratory data:     Recent Radiology Findings:   No results  found.     I have independently reviewed the above radiology studies  and reviewed the findings with the patient.   Recent Lab Findings: Lab Results  Component Value Date   WBC 6.4 12/21/2018   HGB 15.7 12/21/2018   HCT 46.6 12/21/2018   PLT 196 12/21/2018   GLUCOSE 122 (H) 12/21/2018   CHOL 120 08/12/2020   TRIG 223 (H) 08/12/2020   HDL 34 (L) 08/12/2020   LDLCALC 50 08/12/2020   ALT 19 08/12/2020   AST 19 08/12/2020   NA 141 12/21/2018   K 4.5 12/21/2018   CL 104 12/21/2018   CREATININE 0.90 12/21/2018   BUN 15 12/21/2018   CO2 23 12/21/2018     PFTs: - FVC: 72% - FEV1: 75% -DLCO: 78%  Problem List: 1.3cm RLL cavitary nodule.  SUV 1.7 Hx of CABG On Plavix 1.8cm left thyroid nodule   Assessment / Plan:   65 year old gentleman with a 1.3 cm right lower lobe cavitary nodule.  There is minimal uptake on PET/CT.  He also has a history of a CABG and is currently on Plavix.  I reviewed the imaging, and I think that this would be an appropriate case for combination procedure with Dr. Valeta Harms in which she will perform a navigational bronchoscopy with biopsy and marking followed by robotic assisted thoracoscopy with right lower lobe wedge possible lobectomy by me.  It is in a difficult location for straightforward wedge resection, thus I hope that we will be able to obtain a good sample on the navigational bronchoscopy.  The risks and benefits of this procedure been discussed, the patient is agreeable to proceed.  We will coordinate with Dr. Valeta Harms to obtain dates.  His Plavix will need to be held for 5 days.  In regards to the thyroid nodule once his procedures are completed we will work this up.       I  spent 40 minutes with  the patient face to face in counseling and coordination of care.    Lajuana Matte 01/06/2021 12:43 PM

## 2020-12-30 NOTE — H&P (View-Only) (Signed)
[address]       [place] [address]             [phone]                    Brandon [name] Medical Record #[id number] Date of Birth: December 24, 1955  Referring: Garner Nash, DO Primary Care: Lesleigh Noe, MD Primary Cardiologist: Larae Grooms, MD  Chief Complaint:    Chief Complaint  Patient presents with   Lung Lesion    Surgical consult, Chest CT 11/08/20, PET Scan 11/18/20, PFT's 12/19/20    History of Present Illness:    Brandon [name] 65 y.o. male referred by Dr. Valeta Harms for surgical evaluation of a right lower lobe pulmonary nodule.  He was previously enrolled in the lung cancer screening program and was noted to have a change in this nodule.  He does have a significant smoking history but quit in January 2021.  He denies any shortness of breath, or neurologic symptoms.  His weight is also been stable.    Smoking Hx: former smoker which quit in January 2021, 60+ pack years.  CABG 2000, sent in 2005, 2015 [place] Lack is the cardiologiest [name]: At the time of surgery this patient's most appropriate activity status/level should be described as: [x]     0    Normal activity, no symptoms []     1    Restricted in physical strenuous activity but ambulatory, able to do out light work []     2    Ambulatory and capable of self care, unable to do work activities, up and about               >50 % of waking hours                              []     3    Only limited self care, in bed greater than 50% of waking hours []     4    Completely disabled, no self care, confined to bed or chair []     5    Moribund   Past Medical History:  Diagnosis Date   Arthritis    Basal cell carcinoma    CAD (coronary artery disease) 06/14/2018   Cath on 01/23/2014 with LAD occlusion   Essential hypertension 06/08/2018   Gastroesophageal reflux disease 06/08/2018   GERD (gastroesophageal reflux disease)    Heart disease    Heart failure with reduced  ejection fraction ([place]) 06/14/2018   ECHO 01/22/2014 with EF 40-45%   History of chicken pox    History of migraine    Hx of basal cell carcinoma 06/08/2018   04/2017. Left jaw   Hx of CABG    Hyperlipidemia    Inguinal hernia    Mixed hyperlipidemia 06/08/2018   Tobacco use disorder 06/08/2018    Past Surgical History:  Procedure Laterality Date   APPENDECTOMY  1960   CORONARY ARTERY BYPASS GRAFT     x 3   INGUINAL HERNIA REPAIR  2008   SKIN CANCER EXCISION     Stent placed     in 2005 and in 2014    Family History  Problem Relation Age of Onset   Dementia Mother    Heart attack Mother 70   Heart disease Mother    Colon cancer Father 63   Diabetes Father  Colon polyps Sister    Heart attack Brother 67   Heart attack Maternal Grandmother    Heart attack Maternal Grandfather    Stroke Paternal Grandmother 55   Prostate cancer Paternal Grandfather      Social History   Tobacco Use  Smoking Status Former   Packs/day: 1.50   Years: 40.00   Pack years: 60.00   Types: Cigarettes   Start date: 54   Quit date: 04/21/2019   Years since quitting: 1.7  Smokeless Tobacco Former   Types: Chew   Quit date: 04/21/2003    Social History   Substance and Sexual Activity  Alcohol Use Yes   Comment: 2 to 3 beers daily     Allergies  Allergen Reactions   Antihistamines, Loratadine-Type Hives    Any kind of antihistamines-hallucination, swelling up, itching, rash   Penicillins Other (See Comments)    Throat swelling up, hallucinations.   Shellfish Allergy Swelling    More as a kid, and now mild reaction-itchy eyes and nose   Iodine Nausea And Vomiting    Really nauseas and vomited.    Current Outpatient Medications  Medication Sig Dispense Refill   amLODipine (NORVASC) 5 MG tablet TAKE 1 TABLET BY MOUTH EVERY DAY 90 tablet 3   aspirin EC 81 MG tablet Take 1 tablet (81 mg total) by mouth daily. 90 tablet 3   clopidogrel (PLAVIX) 75 MG tablet TAKE 1 TABLET BY MOUTH  EVERY DAY 90 tablet 3   ezetimibe (ZETIA) 10 MG tablet Take 1 tablet (10 mg total) by mouth daily. 90 tablet 3   fenofibrate (TRICOR) 145 MG tablet Take 1 tablet (145 mg total) by mouth daily. 90 tablet 3   metoprolol tartrate (LOPRESSOR) 25 MG tablet TAKE 1 TABLET BY MOUTH TWICE A DAY 180 tablet 3   Multiple Vitamin (MULTIVITAMIN) tablet Take 1 tablet by mouth daily.     nitroGLYCERIN (NITROSTAT) 0.4 MG SL tablet Place 1 tablet (0.4 mg total) under the tongue every 5 (five) minutes as needed for chest pain. 25 tablet 3   omeprazole (PRILOSEC) 20 MG capsule TAKE 1 CAPSULE BY MOUTH EVERY DAY 90 capsule 3   rosuvastatin (CRESTOR) 40 MG tablet TAKE 1 TABLET BY MOUTH EVERY DAY (STOP ATORVASTATIN) 30 tablet 0   triamcinolone cream (KENALOG) 0.1 % APPLY TO AFFECTED AREA TWICE A DAY 30 g 0   No current facility-administered medications for this visit.    Review of Systems  Constitutional: Negative.   Respiratory: Negative.    Cardiovascular: Negative.   Neurological: Negative.     PHYSICAL EXAMINATION: BP 131/75   Pulse 63   Resp 20   Ht 5\' 8"  (1.727 m)   Wt 210 lb (95.3 kg)   SpO2 96% Comment: RA  BMI 31.93 kg/m  Physical Exam Constitutional:      General: He is not in acute distress.    Appearance: Normal appearance. He is normal weight. He is not ill-appearing.  Eyes:     Extraocular Movements: Extraocular movements intact.  Cardiovascular:     Rate and Rhythm: Normal rate.     Heart sounds: No murmur heard. Pulmonary:     Effort: No respiratory distress.  Abdominal:     General: Abdomen is flat. There is no distension.  Musculoskeletal:        General: Normal range of motion.     Cervical back: Normal range of motion.  Skin:    General: Skin is warm and dry.  Neurological:  General: No focal deficit present.     Mental Status: He is alert and oriented to person, place, and time.    Diagnostic Studies & Laboratory data:     Recent Radiology Findings:   No results  found.     I have independently reviewed the above radiology studies  and reviewed the findings with the patient.   Recent Lab Findings: Lab Results  Component Value Date   WBC 6.4 12/21/2018   HGB 15.7 12/21/2018   HCT 46.6 12/21/2018   PLT 196 12/21/2018   GLUCOSE 122 (H) 12/21/2018   CHOL 120 08/12/2020   TRIG 223 (H) 08/12/2020   HDL 34 (L) 08/12/2020   LDLCALC 50 08/12/2020   ALT 19 08/12/2020   AST 19 08/12/2020   NA 141 12/21/2018   K 4.5 12/21/2018   CL 104 12/21/2018   CREATININE 0.90 12/21/2018   BUN 15 12/21/2018   CO2 23 12/21/2018     PFTs: - FVC: 72% - FEV1: 75% -DLCO: 78%  Problem List: 1.3cm RLL cavitary nodule.  SUV 1.7 Hx of CABG On Plavix 1.8cm left thyroid nodule   Assessment / Plan:   65 year old gentleman with a 1.3 cm right lower lobe cavitary nodule.  There is minimal uptake on PET/CT.  He also has a history of a CABG and is currently on Plavix.  I reviewed the imaging, and I think that this would be an appropriate case for combination procedure with Dr. Valeta Harms in which she will perform a navigational bronchoscopy with biopsy and marking followed by robotic assisted thoracoscopy with right lower lobe wedge possible lobectomy by me.  It is in a difficult location for straightforward wedge resection, thus I hope that we will be able to obtain a good sample on the navigational bronchoscopy.  The risks and benefits of this procedure been discussed, the patient is agreeable to proceed.  We will coordinate with Dr. Valeta Harms to obtain dates.  His Plavix will need to be held for 5 days.  In regards to the thyroid nodule once his procedures are completed we will work this up.       I  spent 40 minutes with  the patient face to face in counseling and coordination of care.    Lajuana Matte 01/06/2021 12:43 PM

## 2021-01-01 ENCOUNTER — Other Ambulatory Visit: Payer: Self-pay

## 2021-01-01 ENCOUNTER — Ambulatory Visit (HOSPITAL_COMMUNITY)
Admission: RE | Admit: 2021-01-01 | Discharge: 2021-01-01 | Disposition: A | Discharge status: Home / Self Care | Payer: Medicare HMO | Source: Ambulatory Visit | Attending: Pulmonary Disease | Admitting: Pulmonary Disease

## 2021-01-01 DIAGNOSIS — R911 Solitary pulmonary nodule: Secondary | ICD-10-CM | POA: Insufficient documentation

## 2021-01-01 DIAGNOSIS — Z87891 Personal history of nicotine dependence: Secondary | ICD-10-CM | POA: Diagnosis present

## 2021-01-01 DIAGNOSIS — J984 Other disorders of lung: Secondary | ICD-10-CM | POA: Diagnosis not present

## 2021-01-01 LAB — PULMONARY FUNCTION TEST
DL/VA % pred: 94 %
DL/VA: 3.91 ml/min/mmHg/L
DLCO unc % pred: 78 %
DLCO unc: 19.5 ml/min/mmHg
FEF 25-75 Post: 3.1 L/sec
FEF 25-75 Pre: 2.12 L/sec
FEF2575-%Change-Post: 46 %
FEF2575-%Pred-Post: 127 %
FEF2575-%Pred-Pre: 86 %
FEV1-%Change-Post: 10 %
FEV1-%Pred-Post: 83 %
FEV1-%Pred-Pre: 75 %
FEV1-Post: 2.6 L
FEV1-Pre: 2.35 L
FEV1FVC-%Change-Post: 2 %
FEV1FVC-%Pred-Pre: 103 %
FEV6-%Change-Post: 8 %
FEV6-%Pred-Post: 83 %
FEV6-%Pred-Pre: 76 %
FEV6-Post: 3.3 L
FEV6-Pre: 3.04 L
FEV6FVC-%Change-Post: 0 %
FEV6FVC-%Pred-Post: 105 %
FEV6FVC-%Pred-Pre: 105 %
FVC-%Change-Post: 7 %
FVC-%Pred-Post: 78 %
FVC-%Pred-Pre: 72 %
FVC-Post: 3.31 L
FVC-Pre: 3.07 L
Post FEV1/FVC ratio: 79 %
Post FEV6/FVC ratio: 100 %
Pre FEV1/FVC ratio: 77 %
Pre FEV6/FVC Ratio: 100 %
RV % pred: 125 %
RV: 2.86 L
TLC % pred: 89 %
TLC: 5.93 L

## 2021-01-01 MED ORDER — ALBUTEROL SULFATE (2.5 MG/3ML) 0.083% IN NEBU
2.5000 mg | INHALATION_SOLUTION | Freq: Once | RESPIRATORY_TRACT | Status: AC
  Administered 2021-01-01: 2.5 mg via RESPIRATORY_TRACT

## 2021-01-03 ENCOUNTER — Other Ambulatory Visit: Payer: Self-pay

## 2021-01-03 ENCOUNTER — Institutional Professional Consult (permissible substitution): Payer: Medicare HMO | Admitting: Thoracic Surgery (Cardiothoracic Vascular Surgery)

## 2021-01-03 VITALS — BP 131/75 | HR 63 | Resp 20 | Ht 68.0 in | Wt 210.0 lb

## 2021-01-03 DIAGNOSIS — R911 Solitary pulmonary nodule: Secondary | ICD-10-CM

## 2021-01-06 ENCOUNTER — Encounter: Payer: Self-pay | Admitting: Thoracic Surgery (Cardiothoracic Vascular Surgery)

## 2021-01-07 ENCOUNTER — Telehealth: Payer: Self-pay | Admitting: Pulmonary Disease

## 2021-01-07 ENCOUNTER — Other Ambulatory Visit: Payer: Self-pay | Admitting: *Deleted

## 2021-01-07 ENCOUNTER — Encounter: Payer: Self-pay | Admitting: *Deleted

## 2021-01-07 DIAGNOSIS — R911 Solitary pulmonary nodule: Secondary | ICD-10-CM

## 2021-01-07 NOTE — Telephone Encounter (Signed)
Pulmonary critical care:  Case discussed with Dr. Kipp Brood.  Plans for combined robotic assisted bronchoscopy and robotic assisted thoracic surgery on 01/16/2021.  Orders placed.   Garner Nash, DO [facility] 01/07/2021 2:37 PM

## 2021-01-07 NOTE — Telephone Encounter (Signed)
Called and spoke with patient to let him know that the Super D CT scan that was ordered is the scan that needs to be done for him to have his biopsy done. Advised him that the previous CT and PET scan are to far out and we are not able to use them. Patient expressed understanding and stated that he would like a call back to get it scheduled.   Will route to [facility] pool to call patient and get it scheduled.

## 2021-01-08 NOTE — Telephone Encounter (Signed)
Pt has been scheduled for the CT on 01/13/21 @ 2:15, check in by 2 at [facility].  Pt verbalized understanding.    Pt would like to speak w/ Dr. Valeta Harms about the biopsy.  He has multiple questions in regards to what Dr. Valeta Harms has told the patient about his upcoming procedures vs what Dr. Kipp Brood has told him.   Pt can be reached at [phone].

## 2021-01-09 NOTE — Telephone Encounter (Signed)
Pt states Dr. Valeta Harms did not call him & asks to be reached at [phone]

## 2021-01-13 ENCOUNTER — Other Ambulatory Visit: Payer: Self-pay | Admitting: Interventional Cardiology

## 2021-01-13 ENCOUNTER — Other Ambulatory Visit: Payer: Self-pay

## 2021-01-13 ENCOUNTER — Ambulatory Visit (INDEPENDENT_AMBULATORY_CARE_PROVIDER_SITE_OTHER)
Admission: RE | Admit: 2021-01-13 | Discharge: 2021-01-13 | Disposition: A | Discharge status: Home / Self Care | Payer: Medicare HMO | Source: Ambulatory Visit | Attending: Pulmonary Disease | Admitting: Pulmonary Disease

## 2021-01-13 ENCOUNTER — Other Ambulatory Visit: Payer: Self-pay | Admitting: Primary Care

## 2021-01-13 DIAGNOSIS — L309 Dermatitis, unspecified: Secondary | ICD-10-CM

## 2021-01-13 DIAGNOSIS — R911 Solitary pulmonary nodule: Secondary | ICD-10-CM | POA: Diagnosis not present

## 2021-01-13 DIAGNOSIS — J439 Emphysema, unspecified: Secondary | ICD-10-CM | POA: Diagnosis not present

## 2021-01-13 DIAGNOSIS — Z951 Presence of aortocoronary bypass graft: Secondary | ICD-10-CM

## 2021-01-13 DIAGNOSIS — I10 Essential (primary) hypertension: Secondary | ICD-10-CM

## 2021-01-13 DIAGNOSIS — I700 Atherosclerosis of aorta: Secondary | ICD-10-CM | POA: Diagnosis not present

## 2021-01-13 NOTE — Telephone Encounter (Signed)
NOTED

## 2021-01-13 NOTE — Progress Notes (Signed)
Surgical Instructions    Your procedure is scheduled on 01/16/21.  Report to [facility] Main Entrance "A" at 10:00 A.M., then check in with the Admitting office.  Call this number if you have problems the morning of surgery:  [phone]   If you have any questions prior to your surgery date call [phone]: Open Monday-Friday 8am-4pm    Remember:  Do not eat after midnight the night before your surgery  You may drink clear liquids until 9:00 the morning of your surgery.   Clear liquids allowed are: Water, Non-Citrus Juices (without pulp), Carbonated Beverages, Clear Tea, Black Coffee ONLY (NO MILK, CREAM OR POWDERED CREAMER of any kind), and Gatorade    Take these medicines the morning of surgery with A SIP OF WATER: amLODipine (NORVASC) ezetimibe (ZETIA) fenofibrate (TRICOR) metoprolol tartrate (LOPRESSOR)  omeprazole (PRILOSEC) rosuvastatin (CRESTOR)  If Needed: Artificial Tear  nitroGLYCERIN (NITROSTAT)   As of today, STOP taking any Aleve, Naproxen, Ibuprofen, Motrin, Advil, Goody's, BC's, all herbal medications, fish oil, and all vitamins.  Follow your surgeon's instructions on when to stop Plavix and Aspirin.  If no instructions were given by your surgeon then you will need to call the office to get those instructions.             Do not wear jewelry Do not wear lotions, powders, colognes, or deodorant. Do not shave 48 hours prior to surgery.  Men may shave face and neck. Do not bring valuables to the hospital.              [facility] is not responsible for any belongings or valuables.  Do NOT Smoke (Tobacco/Vaping)  or drink alcohol 24 hours prior to your procedure If you use a CPAP at night, you may bring your mask for your overnight stay.   Contacts, glasses, dentures or bridgework may not be worn into surgery, please bring cases for these belongings   For patients admitted to the hospital, discharge time will be determined by your treatment team.    Patients discharged the day of surgery will not be allowed to drive home, and someone needs to stay with them for 24 hours.  NO VISITORS WILL BE ALLOWED IN PRE-OP WHERE PATIENTS GET READY FOR SURGERY.  ONLY 1 SUPPORT PERSON MAY BE PRESENT WHILE YOU ARE IN SURGERY.  IF YOU ARE TO BE ADMITTED, ONCE YOU ARE IN YOUR ROOM YOU WILL BE ALLOWED TWO (2) VISITORS.  Minor children may have two parents present. Special consideration for safety and communication needs will be reviewed on a case by case basis.  Special instructions:    Oral Hygiene is also important to reduce your risk of infection.  Remember - BRUSH YOUR TEETH THE MORNING OF SURGERY WITH YOUR REGULAR TOOTHPASTE   [place]- Preparing For Surgery  Before surgery, you can play an important role. Because skin is not sterile, your skin needs to be as free of germs as possible. You can reduce the number of germs on your skin by washing with CHG (chlorahexidine gluconate) Soap before surgery.  CHG is an antiseptic cleaner which kills germs and bonds with the skin to continue killing germs even after washing.     Please do not use if you have an allergy to CHG or antibacterial soaps. If your skin becomes reddened/irritated stop using the CHG.  Do not shave (including legs and underarms) for at least 48 hours prior to first CHG shower. It is OK to shave your face.  Please  follow these instructions carefully.     Shower the NIGHT BEFORE SURGERY and the MORNING OF SURGERY with CHG Soap.   If you chose to wash your hair, wash your hair first as usual with your normal shampoo. After you shampoo, rinse your hair and body thoroughly to remove the shampoo.  Then [employer] and genitals (private parts) with your normal soap and rinse thoroughly to remove soap.  After that Use CHG Soap as you would any other liquid soap. You can apply CHG directly to the skin and wash gently with a scrungie or a clean washcloth.   Apply the CHG Soap to your body ONLY  FROM THE NECK DOWN.  Do not use on open wounds or open sores. Avoid contact with your eyes, ears, mouth and genitals (private parts). Wash Face and genitals (private parts)  with your normal soap.   Wash thoroughly, paying special attention to the area where your surgery will be performed.  Thoroughly rinse your body with warm water from the neck down.  DO NOT shower/wash with your normal soap after using and rinsing off the CHG Soap.  Pat yourself dry with a CLEAN TOWEL.  Wear CLEAN PAJAMAS to bed the night before surgery  Place CLEAN SHEETS on your bed the night before your surgery  DO NOT SLEEP WITH PETS.   Day of Surgery:  Take a shower with CHG soap. Wear Clean/Comfortable clothing the morning of surgery Do not apply any deodorants/lotions.   Remember to brush your teeth WITH YOUR REGULAR TOOTHPASTE.   Please read over the following fact sheets that you were given.

## 2021-01-14 ENCOUNTER — Other Ambulatory Visit: Payer: Self-pay

## 2021-01-14 ENCOUNTER — Encounter (HOSPITAL_COMMUNITY): Payer: Self-pay

## 2021-01-14 ENCOUNTER — Ambulatory Visit (HOSPITAL_COMMUNITY)
Admission: RE | Admit: 2021-01-14 | Discharge: 2021-01-14 | Disposition: A | Discharge status: Home / Self Care | Payer: Medicare HMO | Source: Ambulatory Visit | Attending: Thoracic Surgery (Cardiothoracic Vascular Surgery) | Admitting: Thoracic Surgery (Cardiothoracic Vascular Surgery)

## 2021-01-14 ENCOUNTER — Encounter (HOSPITAL_COMMUNITY)
Admission: RE | Admit: 2021-01-14 | Discharge: 2021-01-14 | Disposition: A | Discharge status: Home / Self Care | Payer: Medicare HMO | Source: Ambulatory Visit | Attending: Thoracic Surgery (Cardiothoracic Vascular Surgery) | Admitting: Thoracic Surgery (Cardiothoracic Vascular Surgery)

## 2021-01-14 DIAGNOSIS — Z01818 Encounter for other preprocedural examination: Secondary | ICD-10-CM | POA: Insufficient documentation

## 2021-01-14 DIAGNOSIS — I252 Old myocardial infarction: Secondary | ICD-10-CM | POA: Insufficient documentation

## 2021-01-14 DIAGNOSIS — R911 Solitary pulmonary nodule: Secondary | ICD-10-CM

## 2021-01-14 DIAGNOSIS — E782 Mixed hyperlipidemia: Secondary | ICD-10-CM | POA: Diagnosis not present

## 2021-01-14 DIAGNOSIS — K219 Gastro-esophageal reflux disease without esophagitis: Secondary | ICD-10-CM | POA: Insufficient documentation

## 2021-01-14 DIAGNOSIS — Z87891 Personal history of nicotine dependence: Secondary | ICD-10-CM | POA: Insufficient documentation

## 2021-01-14 DIAGNOSIS — Z85828 Personal history of other malignant neoplasm of skin: Secondary | ICD-10-CM | POA: Diagnosis not present

## 2021-01-14 DIAGNOSIS — Z7902 Long term (current) use of antithrombotics/antiplatelets: Secondary | ICD-10-CM | POA: Insufficient documentation

## 2021-01-14 DIAGNOSIS — I110 Hypertensive heart disease with heart failure: Secondary | ICD-10-CM | POA: Diagnosis not present

## 2021-01-14 DIAGNOSIS — D62 Acute posthemorrhagic anemia: Secondary | ICD-10-CM | POA: Diagnosis not present

## 2021-01-14 DIAGNOSIS — I700 Atherosclerosis of aorta: Secondary | ICD-10-CM | POA: Insufficient documentation

## 2021-01-14 DIAGNOSIS — E669 Obesity, unspecified: Secondary | ICD-10-CM | POA: Insufficient documentation

## 2021-01-14 DIAGNOSIS — Z951 Presence of aortocoronary bypass graft: Secondary | ICD-10-CM | POA: Insufficient documentation

## 2021-01-14 DIAGNOSIS — Z7982 Long term (current) use of aspirin: Secondary | ICD-10-CM | POA: Insufficient documentation

## 2021-01-14 DIAGNOSIS — Z7901 Long term (current) use of anticoagulants: Secondary | ICD-10-CM | POA: Insufficient documentation

## 2021-01-14 DIAGNOSIS — C3431 Malignant neoplasm of lower lobe, right bronchus or lung: Secondary | ICD-10-CM | POA: Diagnosis not present

## 2021-01-14 DIAGNOSIS — I5020 Unspecified systolic (congestive) heart failure: Secondary | ICD-10-CM | POA: Insufficient documentation

## 2021-01-14 DIAGNOSIS — I2510 Atherosclerotic heart disease of native coronary artery without angina pectoris: Secondary | ICD-10-CM | POA: Insufficient documentation

## 2021-01-14 DIAGNOSIS — E041 Nontoxic single thyroid nodule: Secondary | ICD-10-CM | POA: Diagnosis not present

## 2021-01-14 DIAGNOSIS — Z79899 Other long term (current) drug therapy: Secondary | ICD-10-CM | POA: Insufficient documentation

## 2021-01-14 DIAGNOSIS — I517 Cardiomegaly: Secondary | ICD-10-CM | POA: Diagnosis not present

## 2021-01-14 DIAGNOSIS — E785 Hyperlipidemia, unspecified: Secondary | ICD-10-CM | POA: Insufficient documentation

## 2021-01-14 DIAGNOSIS — Z6832 Body mass index (BMI) 32.0-32.9, adult: Secondary | ICD-10-CM | POA: Insufficient documentation

## 2021-01-14 DIAGNOSIS — Z20822 Contact with and (suspected) exposure to covid-19: Secondary | ICD-10-CM | POA: Insufficient documentation

## 2021-01-14 DIAGNOSIS — I5022 Chronic systolic (congestive) heart failure: Secondary | ICD-10-CM | POA: Diagnosis not present

## 2021-01-14 LAB — BLOOD GAS, ARTERIAL
Acid-Base Excess: 2.1 mmol/L — ABNORMAL HIGH (ref 0.0–2.0)
Bicarbonate: 26.3 mmol/L (ref 20.0–28.0)
FIO2: 21
O2 Saturation: 97.1 %
Patient temperature: 37
pCO2 arterial: 42.2 mmHg (ref 32.0–48.0)
pH, Arterial: 7.411 (ref 7.350–7.450)
pO2, Arterial: 95.8 mmHg (ref 83.0–108.0)

## 2021-01-14 LAB — URINALYSIS, ROUTINE W REFLEX MICROSCOPIC
Bacteria, UA: NONE SEEN
Bilirubin Urine: NEGATIVE
Glucose, UA: NEGATIVE mg/dL
Hgb urine dipstick: NEGATIVE
Ketones, ur: NEGATIVE mg/dL
Nitrite: NEGATIVE
Protein, ur: NEGATIVE mg/dL
Specific Gravity, Urine: 1.019 (ref 1.005–1.030)
pH: 5 (ref 5.0–8.0)

## 2021-01-14 LAB — CBC
HCT: 41.7 % (ref 39.0–52.0)
Hemoglobin: 13.8 g/dL (ref 13.0–17.0)
MCH: 29.7 pg (ref 26.0–34.0)
MCHC: 33.1 g/dL (ref 30.0–36.0)
MCV: 89.9 fL (ref 80.0–100.0)
Platelets: 230 10*3/uL (ref 150–400)
RBC: 4.64 MIL/uL (ref 4.22–5.81)
RDW: 13.1 % (ref 11.5–15.5)
WBC: 6.1 10*3/uL (ref 4.0–10.5)
nRBC: 0 % (ref 0.0–0.2)

## 2021-01-14 LAB — COMPREHENSIVE METABOLIC PANEL
ALT: 24 U/L (ref 0–44)
AST: 24 U/L (ref 15–41)
Albumin: 3.9 g/dL (ref 3.5–5.0)
Alkaline Phosphatase: 31 U/L — ABNORMAL LOW (ref 38–126)
Anion gap: 8 (ref 5–15)
BUN: 12 mg/dL (ref 8–23)
CO2: 24 mmol/L (ref 22–32)
Calcium: 8.6 mg/dL — ABNORMAL LOW (ref 8.9–10.3)
Chloride: 105 mmol/L (ref 98–111)
Creatinine, Ser: 0.95 mg/dL (ref 0.61–1.24)
GFR, Estimated: >60 mL/min (ref >60)
Glucose, Bld: 116 mg/dL — ABNORMAL HIGH (ref 70–99)
Potassium: 4 mmol/L (ref 3.5–5.1)
Sodium: 137 mmol/L (ref 135–145)
Total Bilirubin: 0.7 mg/dL (ref 0.3–1.2)
Total Protein: 6.3 g/dL — ABNORMAL LOW (ref 6.5–8.1)

## 2021-01-14 LAB — PROTIME-INR
INR: 1 (ref 0.8–1.2)
Prothrombin Time: 13 seconds (ref 11.4–15.2)

## 2021-01-14 LAB — SURGICAL PCR SCREEN
MRSA, PCR: NEGATIVE
Staphylococcus aureus: NEGATIVE

## 2021-01-14 LAB — APTT: aPTT: 29 seconds (ref 24–36)

## 2021-01-14 LAB — TYPE AND SCREEN
ABO/RH(D): O NEG
Antibody Screen: NEGATIVE

## 2021-01-14 LAB — SARS CORONAVIRUS 2 (TAT 6-24 HRS): SARS Coronavirus 2: NEGATIVE

## 2021-01-14 NOTE — Progress Notes (Signed)
PCP: Waunita Schooner, MD Cardiologist: Larae Grooms, MD  EKG: 01/14/21 CXR: 01/14/21 ECHO: 01/22/14 Stress Test: denies Cardiac Cath: per patient 2014 and 2015.  Note in Epic states 2017 or 2018. PFT's: 01/01/21  Fasting Blood Sugar- na Checks Blood Sugar_na__ times a day  OSA/CPAP: No  ASA: pt will call office today for instructios Blood Thinner: Last dose 01/10/21  Covid test 01/14/21 at PAT  Anesthesia Review: Yes, cardiac history.  Requested echo and cath reports from [facility] in [place], [place].  Performed at [facility] in [place].   Patient denies shortness of breath, fever, cough, and chest pain at PAT appointment.  Patient verbalized understanding of instructions provided today at the PAT appointment.  Patient asked to review instructions at home and day of surgery.

## 2021-01-15 ENCOUNTER — Telehealth: Payer: Self-pay | Admitting: Pulmonary Disease

## 2021-01-15 NOTE — Telephone Encounter (Signed)
Called and spoke with Brandon [name].  Brandon [name] is scheduled bronchoscopy tomorrow, with Dr. Valeta Harms.  Brandon [name] stated he was told if biopsy was positive for cancer, a surgeon would be on standby for surgery.  If biopsy was benign Brandon [name] would be woke up and allowed to go home.  Brandon [name] asked if Dr. Valeta Harms would speak with him before procedure.  Brandon [name] stated he felt surgeon was pushing surgery and Brandon [name] stated he felt uneasy about that. Brandon [name] saw super D results and stated lesion was smaller and diminished then previous CT.  Brandon [name] felt that was a positive sign and feels he may not need surgery after bronchoscopy. Brandon [name] is asking for Dr. Valeta Harms to talk to him before procedure tomorrow.  Message routed to Dr. Valeta Harms

## 2021-01-15 NOTE — Anesthesia Preprocedure Evaluation (Addendum)
Anesthesia Evaluation  Patient identified by MRN, date of birth, ID band Patient awake    Reviewed: Allergy & Precautions, NPO status , Patient's Chart, lab work & pertinent test results  Airway Mallampati: II  TM Distance: >3 FB     Dental   Pulmonary former smoker,    breath sounds clear to auscultation       Cardiovascular hypertension, Pt. on medications and Pt. on home beta blockers + CAD and + CABG   Rhythm:Regular Rate:Normal     Neuro/Psych negative neurological ROS     GI/Hepatic Neg liver ROS, GERD  ,  Endo/Other  negative endocrine ROS  Renal/GU negative Renal ROS     Musculoskeletal  (+) Arthritis ,   Abdominal   Peds  Hematology negative hematology ROS (+)   Anesthesia Other Findings   Reproductive/Obstetrics                            Lab Results  Component Value Date   WBC 6.1 01/14/2021   HGB 13.8 01/14/2021   HCT 41.7 01/14/2021   MCV 89.9 01/14/2021   PLT 230 01/14/2021   Lab Results  Component Value Date   CREATININE 0.95 01/14/2021   BUN 12 01/14/2021   NA 137 01/14/2021   K 4.0 01/14/2021   CL 105 01/14/2021   CO2 24 01/14/2021    Anesthesia Physical Anesthesia Plan  ASA: 3  Anesthesia Plan: General   Post-op Pain Management:    Induction: Intravenous  PONV Risk Score and Plan: 2 and Dexamethasone, Ondansetron and Treatment may vary due to age or medical condition  Airway Management Planned: Oral ETT and Double Lumen EBT  Additional Equipment:   Intra-op Plan:   Post-operative Plan: Extubation in OR and Possible Post-op intubation/ventilation  Informed Consent: I have reviewed the patients History and Physical, chart, labs and discussed the procedure including the risks, benefits and alternatives for the proposed anesthesia with the patient or authorized representative who has indicated his/her understanding and acceptance.     Dental  advisory given  Plan Discussed with: CRNA and Surgeon  Anesthesia Plan Comments: ( )       Anesthesia Quick Evaluation

## 2021-01-15 NOTE — Progress Notes (Signed)
Anesthesia Chart Review:  Case: [id number] Date/Time: 01/16/21 1215   Procedure: XI ROBOTIC ASSISTED THORASCOPY- RIGHT LOWER LOBE WEDGE RESECTION, POSSIBLE LOBECTOMY (Right: Chest)   Anesthesia type: General   Pre-op diagnosis: Pulm nodule   Location: [facility] ROOM 10 / [facility]   Surgeons: Lajuana Matte, MD       DISCUSSION: Patient is a 65 year old Brandon [name] scheduled for the above procedure. He is scheduled for video bronchoscopy first by June Leap, DO then additional surgery with Dr. Kipp Brood depending on results.   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 BMI is consistent with obesity. Recent imaging revealed a thyroid nodule with planned work-up following recovery for this surgery.  Last cardiology visit 02/07/20 with Dr. Irish Lack. Continue aggressive secondary prevention for CAD recommended with one year follow-up.   Dr. Kipp Brood classified [name] of 0: Normal activity, no symptoms. He recommended holding Plavix for 5 days prior to surgery.   01/14/21 preoperative COVID-19 test negative. Anesthesia team to evaluate on the day of surgery.    VS: BP (!) 158/79   Pulse 63   Temp 36.6 C   Ht 5\' 8"  (1.727 m)   Wt 96.9 kg   SpO2 100%   BMI 32.48 kg/m   PROVIDERS: Lesleigh Noe, MD is PCP  Larae Grooms, MD is cardiologist June Leap, DO is pulmonologist   LABS: Labs reviewed: Acceptable for surgery. (all labs ordered are listed, but only abnormal results are displayed)  Labs Reviewed  BLOOD GAS, ARTERIAL - Abnormal; Notable for the following components:      Result Value   Acid-Base Excess 2.1 (*)    Allens test  (pass/fail) BRACHIAL ARTERY (*)    All other components within normal limits  COMPREHENSIVE METABOLIC PANEL - Abnormal; Notable for the following components:   Glucose, Bld 116 (*)    Calcium 8.6 (*)    Total Protein 6.3 (*)    Alkaline Phosphatase 31 (*)    All other components within normal limits  URINALYSIS, ROUTINE W REFLEX MICROSCOPIC - Abnormal; Notable for the following components:   APPearance HAZY (*)    Leukocytes,Ua SMALL (*)    All other components within normal limits  SURGICAL PCR SCREEN  SARS CORONAVIRUS 2 (TAT 6-24 HRS)  APTT  CBC  PROTIME-INR  TYPE AND SCREEN     IMAGES: CXR 01/14/21: FINDINGS: Borderline enlargement of cardiac silhouette post median sternotomy. The 2 superior most sternotomy wires are fractured. Atherosclerotic calcification aorta. Lungs clear. No acute infiltrate, pleural effusion, or pneumothorax. Cavitary RIGHT lower lobe nodule identified on prior CT is not radiographically evident. No acute osseous findings. IMPRESSION: No acute abnormalities. Aortic Atherosclerosis (ICD10-I70.0).  CT Super D Chest 01/13/21: IMPRESSION: 1. Similar appearance of a small cavitary lesion of the medial right lower lobe measuring 1.2 cm, with somewhat diminished wall thickening in comparison to prior examination. Behavior over this relatively short interval and lack of FDG avidity on prior examination favor an infectious or inflammatory etiology. Consider additional follow-up at 6 months to ensure stability. 2. Emphysema and diffuse bilateral bronchial wall thickening. 3. Coronary artery disease. Aortic Atherosclerosis (ICD10-I70.0) and Emphysema (ICD10-J43.9).  PET Scan 11/28/20: IMPRESSION: 1. 1.3 cm irregular cavitary nodule  identified on recent lung cancer screening CT shows no hypermetabolism on PET imaging today. Given the cavitary nature of the lesion with relatively minimal soft tissue component, neoplasm is not excluded as well  differentiated in low-grade neoplasm can be poorly FDG avid. Close follow-up recommended. 2. 1.8 cm left thyroid nodule without hypermetabolism. Recommend thyroid US. (Ref: J Am Coll Radiol. 2015 Feb;12(2): 143-50). 3. Colonic diverticulosis without diverticulitis. 4.  Aortic Atherosclerois (ICD10-170.0)   EKG: 01/14/21: Sinus bradycardia at 59 bpm Cannot rule out Inferior infarct , age undetermined Abnormal ECG No old tracing to compare Confirmed by Charolette Forward [phone]) on 01/14/2021 4:54:09 PM   CV: Cardiac cath 01/22/14 [facility]; scanned under Media tab): Left ventriculogram: There is high anterior lateral hypokinesis.  The ejection fraction is approximately Brandon%.  Left main, mild luminal irregularities.  LAD, totally occluded proximally.  Circumflex, totally occluded proximally.  The right coronary artery is a dominant vessel, totally occluded mid vessel.  LIMA to LAD is patent with good distal flow and retrograde filling of the entire LAD system.  Radial graft to obtuse marginal is widely patent with good distal flow and retrograde filling of the circumflex.  The radial graft to the right coronary artery is somewhat thin; however, it is widely patent.  The previous stent site is widely patent.  It provides flow to a very small distal right coronary artery.  Overall there is no change. 1.  Mild to moderately reduced left ventricular systolic function. 2.  Normal left ventricular end-diastolic Pressure. 3.  Severe native coronary artery disease. 4.  Patent bypass grafts with patent LIMA to LAD, patent radial graft to circumflex and a patent radial graft to the RCA. Recommendations: Continue medical therapy.   Echo 01/22/14 [facility]; scanned under Media tab): Summary: Left ventricle: The ventricle was mildly dilated.  Systolic function was normal.  Ejection fraction was estimated in the range of Brandon to 45%.  There was mid anterior lateral walls. Wall  thickness was normal. Doppler: LV diastolic function parameters were normal. Left atrium: The atrium was mildly dilated. (Comparison 06/05/98 post-MI, [facility]; scanned under Media tab: Inferior posterior hypokinesis with mildly depressed systolic function. Estimated EF 45%. Mild concentric LVH, trace MR/TR)   Past Surgical History:  Procedure Laterality Date   APPENDECTOMY  1960   CORONARY ARTERY BYPASS GRAFT     x 3   INGUINAL HERNIA REPAIR  2008   SKIN CANCER EXCISION     Stent placed     in 2005 and in 2014    MEDICATIONS:  amLODipine (NORVASC) 5 MG tablet   Artificial Tear Ointment (DRY EYES OP)   aspirin EC 81 MG tablet   clopidogrel (PLAVIX) 75 MG tablet   ezetimibe (ZETIA) 10 MG tablet   fenofibrate (TRICOR) 145 MG tablet   metoprolol tartrate (LOPRESSOR) 25 MG tablet   Multiple Vitamin (MULTIVITAMIN) tablet   nitroGLYCERIN (NITROSTAT) 0.4 MG SL tablet   omeprazole (PRILOSEC) 20 MG capsule   rosuvastatin (CRESTOR) Brandon MG tablet   No current facility-administered medications for this encounter.    Myra Gianotti, PA-C Surgical Short Stay/Anesthesiology [facility] Phone [phone] [facility] Phone [phone] 01/15/2021 12:49 PM

## 2021-01-15 NOTE — Telephone Encounter (Signed)
Noted  

## 2021-01-16 ENCOUNTER — Ambulatory Visit (HOSPITAL_COMMUNITY): Payer: Medicare HMO | Admitting: Anesthesiology

## 2021-01-16 ENCOUNTER — Encounter (HOSPITAL_COMMUNITY): Payer: Self-pay | Admitting: Pulmonary Disease

## 2021-01-16 ENCOUNTER — Encounter (HOSPITAL_COMMUNITY)
Admission: RE | Disposition: A | Discharge status: Home / Self Care | Payer: Self-pay | Source: Ambulatory Visit | Attending: Thoracic Surgery (Cardiothoracic Vascular Surgery)

## 2021-01-16 ENCOUNTER — Other Ambulatory Visit: Payer: Self-pay

## 2021-01-16 ENCOUNTER — Inpatient Hospital Stay (HOSPITAL_COMMUNITY)
Admission: RE | Admit: 2021-01-16 | Discharge: 2021-01-17 | DRG: 164 | Disposition: A | Discharge status: Home / Self Care | Payer: Medicare HMO | Source: Ambulatory Visit | Attending: Thoracic Surgery (Cardiothoracic Vascular Surgery) | Admitting: Thoracic Surgery (Cardiothoracic Vascular Surgery)

## 2021-01-16 ENCOUNTER — Inpatient Hospital Stay (HOSPITAL_COMMUNITY): Payer: Medicare HMO

## 2021-01-16 ENCOUNTER — Ambulatory Visit (HOSPITAL_COMMUNITY): Payer: Medicare HMO | Admitting: Vascular Surgery

## 2021-01-16 ENCOUNTER — Ambulatory Visit (HOSPITAL_COMMUNITY): Payer: Medicare HMO

## 2021-01-16 DIAGNOSIS — Z87891 Personal history of nicotine dependence: Secondary | ICD-10-CM

## 2021-01-16 DIAGNOSIS — D62 Acute posthemorrhagic anemia: Secondary | ICD-10-CM | POA: Diagnosis not present

## 2021-01-16 DIAGNOSIS — Z800 Family history of malignant neoplasm of digestive organs: Secondary | ICD-10-CM | POA: Diagnosis not present

## 2021-01-16 DIAGNOSIS — E782 Mixed hyperlipidemia: Secondary | ICD-10-CM | POA: Diagnosis present

## 2021-01-16 DIAGNOSIS — M1990 Unspecified osteoarthritis, unspecified site: Secondary | ICD-10-CM | POA: Diagnosis not present

## 2021-01-16 DIAGNOSIS — Z91013 Allergy to seafood: Secondary | ICD-10-CM | POA: Diagnosis not present

## 2021-01-16 DIAGNOSIS — Z7982 Long term (current) use of aspirin: Secondary | ICD-10-CM

## 2021-01-16 DIAGNOSIS — I2510 Atherosclerotic heart disease of native coronary artery without angina pectoris: Secondary | ICD-10-CM | POA: Diagnosis not present

## 2021-01-16 DIAGNOSIS — R911 Solitary pulmonary nodule: Secondary | ICD-10-CM | POA: Insufficient documentation

## 2021-01-16 DIAGNOSIS — Z20822 Contact with and (suspected) exposure to covid-19: Secondary | ICD-10-CM | POA: Diagnosis present

## 2021-01-16 DIAGNOSIS — Z902 Acquired absence of lung [part of]: Secondary | ICD-10-CM | POA: Diagnosis not present

## 2021-01-16 DIAGNOSIS — Z85828 Personal history of other malignant neoplasm of skin: Secondary | ICD-10-CM | POA: Diagnosis not present

## 2021-01-16 DIAGNOSIS — K219 Gastro-esophageal reflux disease without esophagitis: Secondary | ICD-10-CM | POA: Diagnosis present

## 2021-01-16 DIAGNOSIS — Z7902 Long term (current) use of antithrombotics/antiplatelets: Secondary | ICD-10-CM | POA: Diagnosis not present

## 2021-01-16 DIAGNOSIS — I5022 Chronic systolic (congestive) heart failure: Secondary | ICD-10-CM | POA: Diagnosis present

## 2021-01-16 DIAGNOSIS — Z8042 Family history of malignant neoplasm of prostate: Secondary | ICD-10-CM | POA: Diagnosis not present

## 2021-01-16 DIAGNOSIS — J939 Pneumothorax, unspecified: Secondary | ICD-10-CM | POA: Diagnosis not present

## 2021-01-16 DIAGNOSIS — Z8249 Family history of ischemic heart disease and other diseases of the circulatory system: Secondary | ICD-10-CM

## 2021-01-16 DIAGNOSIS — I110 Hypertensive heart disease with heart failure: Secondary | ICD-10-CM | POA: Diagnosis not present

## 2021-01-16 DIAGNOSIS — Z880 Allergy status to penicillin: Secondary | ICD-10-CM

## 2021-01-16 DIAGNOSIS — Z79899 Other long term (current) drug therapy: Secondary | ICD-10-CM

## 2021-01-16 DIAGNOSIS — C3431 Malignant neoplasm of lower lobe, right bronchus or lung: Secondary | ICD-10-CM | POA: Diagnosis not present

## 2021-01-16 DIAGNOSIS — E041 Nontoxic single thyroid nodule: Secondary | ICD-10-CM | POA: Diagnosis present

## 2021-01-16 DIAGNOSIS — Z833 Family history of diabetes mellitus: Secondary | ICD-10-CM | POA: Diagnosis not present

## 2021-01-16 DIAGNOSIS — Z951 Presence of aortocoronary bypass graft: Secondary | ICD-10-CM | POA: Diagnosis not present

## 2021-01-16 DIAGNOSIS — Z955 Presence of coronary angioplasty implant and graft: Secondary | ICD-10-CM

## 2021-01-16 DIAGNOSIS — Z91041 Radiographic dye allergy status: Secondary | ICD-10-CM | POA: Diagnosis not present

## 2021-01-16 DIAGNOSIS — R590 Localized enlarged lymph nodes: Secondary | ICD-10-CM | POA: Diagnosis not present

## 2021-01-16 DIAGNOSIS — Z419 Encounter for procedure for purposes other than remedying health state, unspecified: Secondary | ICD-10-CM

## 2021-01-16 HISTORY — PX: VIDEO BRONCHOSCOPY WITH RADIAL ENDOBRONCHIAL ULTRASOUND: SHX6849

## 2021-01-16 HISTORY — PX: VIDEO BRONCHOSCOPY WITH ENDOBRONCHIAL NAVIGATION: SHX6175

## 2021-01-16 HISTORY — PX: BRONCHIAL BRUSHINGS: SHX5108

## 2021-01-16 HISTORY — PX: BRONCHIAL BIOPSY: SHX5109

## 2021-01-16 HISTORY — PX: FIDUCIAL MARKER PLACEMENT: SHX6858

## 2021-01-16 LAB — ABO/RH: ABO/RH(D): O NEG

## 2021-01-16 SURGERY — VIDEO BRONCHOSCOPY WITH ENDOBRONCHIAL NAVIGATION
Anesthesia: General

## 2021-01-16 SURGERY — WEDGE RESECTION, LUNG, ROBOT-ASSISTED, THORACOSCOPIC
Anesthesia: General | Site: Chest | Laterality: Right

## 2021-01-16 MED ORDER — LACTATED RINGERS IV SOLN: INTRAVENOUS | Status: DC | PRN

## 2021-01-16 MED ORDER — SUGAMMADEX SODIUM 200 MG/2ML IV SOLN
INTRAVENOUS | Status: DC | PRN
  Administered 2021-01-16: 200 mg via INTRAVENOUS

## 2021-01-16 MED ORDER — SODIUM CHLORIDE FLUSH 0.9 % IV SOLN
INTRAVENOUS | Status: DC | PRN
  Administered 2021-01-16: 100 mL

## 2021-01-16 MED ORDER — VANCOMYCIN HCL IN DEXTROSE 1-5 GM/200ML-% IV SOLN
1000.0000 mg | INTRAVENOUS | Status: AC
  Administered 2021-01-16: 1000 mg via INTRAVENOUS
  Filled 2021-01-16: qty 200

## 2021-01-16 MED ORDER — ONDANSETRON HCL 4 MG/2ML IJ SOLN
INTRAMUSCULAR | Status: DC | PRN
  Administered 2021-01-16: 4 mg via INTRAVENOUS

## 2021-01-16 MED ORDER — ACETAMINOPHEN 10 MG/ML IV SOLN
INTRAVENOUS | Status: AC
  Filled 2021-01-16: qty 100

## 2021-01-16 MED ORDER — TRAMADOL HCL 50 MG PO TABS: 50.0000 mg | ORAL_TABLET | Freq: Four times a day (QID) | ORAL | Status: DC | PRN

## 2021-01-16 MED ORDER — FENTANYL CITRATE (PF) 250 MCG/5ML IJ SOLN
INTRAMUSCULAR | Status: DC | PRN
  Administered 2021-01-16: 150 ug via INTRAVENOUS
  Administered 2021-01-16: 100 ug via INTRAVENOUS
  Administered 2021-01-16 (×2): 50 ug via INTRAVENOUS

## 2021-01-16 MED ORDER — KETOROLAC TROMETHAMINE 15 MG/ML IJ SOLN
INTRAMUSCULAR | Status: AC
  Filled 2021-01-16: qty 1

## 2021-01-16 MED ORDER — ASPIRIN EC 81 MG PO TBEC
81.0000 mg | DELAYED_RELEASE_TABLET | Freq: Every day | ORAL | Status: DC
  Administered 2021-01-17: 81 mg via ORAL
  Filled 2021-01-16: qty 1

## 2021-01-16 MED ORDER — 0.9 % SODIUM CHLORIDE (POUR BTL) OPTIME
TOPICAL | Status: DC | PRN
  Administered 2021-01-16: 2000 mL

## 2021-01-16 MED ORDER — PHENYLEPHRINE HCL-NACL 20-0.9 MG/250ML-% IV SOLN
INTRAVENOUS | Status: DC | PRN
  Administered 2021-01-16: 25 ug/min via INTRAVENOUS

## 2021-01-16 MED ORDER — AMLODIPINE BESYLATE 5 MG PO TABS: 5.0000 mg | ORAL_TABLET | Freq: Every day | ORAL | Status: DC

## 2021-01-16 MED ORDER — EZETIMIBE 10 MG PO TABS
10.0000 mg | ORAL_TABLET | Freq: Every day | ORAL | Status: DC
  Administered 2021-01-17: 10 mg via ORAL
  Filled 2021-01-16: qty 1

## 2021-01-16 MED ORDER — LIDOCAINE 2% (20 MG/ML) 5 ML SYRINGE
INTRAMUSCULAR | Status: DC | PRN
  Administered 2021-01-16: 60 mg via INTRAVENOUS

## 2021-01-16 MED ORDER — MIDAZOLAM HCL 2 MG/2ML IJ SOLN
INTRAMUSCULAR | Status: DC | PRN
  Administered 2021-01-16: 2 mg via INTRAVENOUS

## 2021-01-16 MED ORDER — AMISULPRIDE (ANTIEMETIC) 5 MG/2ML IV SOLN
10.0000 mg | Freq: Once | INTRAVENOUS | Status: DC | PRN
Start: 2021-01-16 — End: 2021-01-16

## 2021-01-16 MED ORDER — METOPROLOL TARTRATE 25 MG PO TABS: 25.0000 mg | ORAL_TABLET | Freq: Two times a day (BID) | ORAL | Status: DC

## 2021-01-16 MED ORDER — ORAL CARE MOUTH RINSE: 15.0000 mL | Freq: Once | OROMUCOSAL | Status: AC

## 2021-01-16 MED ORDER — ACETAMINOPHEN 160 MG/5ML PO SOLN: 325.0000 mg | Freq: Once | ORAL | Status: DC | PRN

## 2021-01-16 MED ORDER — MORPHINE SULFATE (PF) 2 MG/ML IV SOLN
INTRAVENOUS | Status: AC
  Filled 2021-01-16: qty 1

## 2021-01-16 MED ORDER — VANCOMYCIN HCL IN DEXTROSE 1-5 GM/200ML-% IV SOLN
1000.0000 mg | Freq: Two times a day (BID) | INTRAVENOUS | Status: AC
  Administered 2021-01-16: 1000 mg via INTRAVENOUS
  Filled 2021-01-16: qty 200

## 2021-01-16 MED ORDER — ACETAMINOPHEN 10 MG/ML IV SOLN
1000.0000 mg | Freq: Once | INTRAVENOUS | Status: DC | PRN
  Administered 2021-01-16: 1000 mg via INTRAVENOUS

## 2021-01-16 MED ORDER — ENOXAPARIN SODIUM 40 MG/0.4ML IJ SOSY
40.0000 mg | PREFILLED_SYRINGE | Freq: Every day | INTRAMUSCULAR | Status: DC
  Administered 2021-01-17: 40 mg via SUBCUTANEOUS
  Filled 2021-01-16 (×2): qty 0.4

## 2021-01-16 MED ORDER — ACETAMINOPHEN 325 MG PO TABS: 325.0000 mg | ORAL_TABLET | Freq: Once | ORAL | Status: DC | PRN

## 2021-01-16 MED ORDER — KETOROLAC TROMETHAMINE 15 MG/ML IJ SOLN
15.0000 mg | Freq: Four times a day (QID) | INTRAMUSCULAR | Status: DC
  Administered 2021-01-16 – 2021-01-17 (×4): 15 mg via INTRAVENOUS
  Filled 2021-01-16 (×3): qty 1

## 2021-01-16 MED ORDER — ROCURONIUM BROMIDE 10 MG/ML (PF) SYRINGE
PREFILLED_SYRINGE | INTRAVENOUS | Status: AC
  Filled 2021-01-16: qty 10

## 2021-01-16 MED ORDER — LACTATED RINGERS IV SOLN: INTRAVENOUS | Status: DC

## 2021-01-16 MED ORDER — PROPOFOL 500 MG/50ML IV EMUL
INTRAVENOUS | Status: DC | PRN
Start: 2021-01-16 — End: 2021-01-16
  Administered 2021-01-16: 50 ug/kg/min via INTRAVENOUS

## 2021-01-16 MED ORDER — FENOFIBRATE 160 MG PO TABS
160.0000 mg | ORAL_TABLET | Freq: Every day | ORAL | Status: DC
  Administered 2021-01-17: 160 mg via ORAL
  Filled 2021-01-16: qty 1

## 2021-01-16 MED ORDER — BISACODYL 5 MG PO TBEC
10.0000 mg | DELAYED_RELEASE_TABLET | Freq: Every day | ORAL | Status: DC
  Filled 2021-01-16: qty 2

## 2021-01-16 MED ORDER — SENNOSIDES-DOCUSATE SODIUM 8.6-50 MG PO TABS: 1.0000 | ORAL_TABLET | Freq: Every day | ORAL | Status: DC

## 2021-01-16 MED ORDER — HYDROMORPHONE HCL 1 MG/ML IJ SOLN
INTRAMUSCULAR | Status: AC
  Filled 2021-01-16: qty 1

## 2021-01-16 MED ORDER — BUPIVACAINE HCL (PF) 0.5 % IJ SOLN
INTRAMUSCULAR | Status: AC
  Filled 2021-01-16: qty 30

## 2021-01-16 MED ORDER — ROSUVASTATIN CALCIUM 20 MG PO TABS
20.0000 mg | ORAL_TABLET | Freq: Every day | ORAL | Status: DC
  Administered 2021-01-17: 20 mg via ORAL
  Filled 2021-01-16: qty 1

## 2021-01-16 MED ORDER — HYDRALAZINE HCL 20 MG/ML IJ SOLN: 10.0000 mg | Freq: Four times a day (QID) | INTRAMUSCULAR | Status: DC | PRN

## 2021-01-16 MED ORDER — ROCURONIUM BROMIDE 10 MG/ML (PF) SYRINGE
PREFILLED_SYRINGE | INTRAVENOUS | Status: DC | PRN
  Administered 2021-01-16: 70 mg via INTRAVENOUS
  Administered 2021-01-16: 50 mg via INTRAVENOUS
  Administered 2021-01-16 (×2): 30 mg via INTRAVENOUS
  Administered 2021-01-16: 20 mg via INTRAVENOUS

## 2021-01-16 MED ORDER — SUCCINYLCHOLINE CHLORIDE 200 MG/10ML IV SOSY
PREFILLED_SYRINGE | INTRAVENOUS | Status: DC | PRN
  Administered 2021-01-16: 100 mg via INTRAVENOUS

## 2021-01-16 MED ORDER — GLYCOPYRROLATE PF 0.2 MG/ML IJ SOSY
PREFILLED_SYRINGE | INTRAMUSCULAR | Status: AC
  Filled 2021-01-16: qty 1

## 2021-01-16 MED ORDER — MEPERIDINE HCL 25 MG/ML IJ SOLN: 6.2500 mg | INTRAMUSCULAR | Status: DC | PRN

## 2021-01-16 MED ORDER — PROPOFOL 10 MG/ML IV BOLUS
INTRAVENOUS | Status: DC | PRN
Start: 2021-01-16 — End: 2021-01-16
  Administered 2021-01-16: 200 mg via INTRAVENOUS

## 2021-01-16 MED ORDER — ACETAMINOPHEN 160 MG/5ML PO SOLN: 1000.0000 mg | Freq: Four times a day (QID) | ORAL | Status: DC

## 2021-01-16 MED ORDER — FENTANYL CITRATE (PF) 250 MCG/5ML IJ SOLN
INTRAMUSCULAR | Status: AC
  Filled 2021-01-16: qty 5

## 2021-01-16 MED ORDER — DEXAMETHASONE SODIUM PHOSPHATE 10 MG/ML IJ SOLN
INTRAMUSCULAR | Status: DC | PRN
  Administered 2021-01-16: 10 mg via INTRAVENOUS

## 2021-01-16 MED ORDER — ONDANSETRON HCL 4 MG/2ML IJ SOLN: 4.0000 mg | Freq: Four times a day (QID) | INTRAMUSCULAR | Status: DC | PRN

## 2021-01-16 MED ORDER — HYDROMORPHONE HCL 1 MG/ML IJ SOLN
0.2500 mg | INTRAMUSCULAR | Status: DC | PRN
  Administered 2021-01-16 (×4): 0.5 mg via INTRAVENOUS

## 2021-01-16 MED ORDER — ONDANSETRON HCL 4 MG/2ML IJ SOLN
INTRAMUSCULAR | Status: AC
  Filled 2021-01-16: qty 2

## 2021-01-16 MED ORDER — METHYLENE BLUE 0.5 % INJ SOLN
INTRAVENOUS | Status: DC | PRN
  Administered 2021-01-16: 1.5 mL

## 2021-01-16 MED ORDER — PROMETHAZINE HCL 25 MG/ML IJ SOLN: 6.2500 mg | INTRAMUSCULAR | Status: DC | PRN

## 2021-01-16 MED ORDER — BUPIVACAINE LIPOSOME 1.3 % IJ SUSP
INTRAMUSCULAR | Status: AC
  Filled 2021-01-16: qty 20

## 2021-01-16 MED ORDER — CHLORHEXIDINE GLUCONATE 0.12 % MT SOLN
15.0000 mL | Freq: Once | OROMUCOSAL | Status: AC
  Administered 2021-01-16: 15 mL via OROMUCOSAL
  Filled 2021-01-16: qty 15

## 2021-01-16 MED ORDER — ACETAMINOPHEN 500 MG PO TABS
1000.0000 mg | ORAL_TABLET | Freq: Four times a day (QID) | ORAL | Status: DC
  Administered 2021-01-16 – 2021-01-17 (×3): 1000 mg via ORAL
  Filled 2021-01-16 (×3): qty 2

## 2021-01-16 MED ORDER — GLYCOPYRROLATE 0.2 MG/ML IJ SOLN
INTRAMUSCULAR | Status: DC | PRN
  Administered 2021-01-16: .2 mg via INTRAVENOUS

## 2021-01-16 MED ORDER — MORPHINE SULFATE (PF) 2 MG/ML IV SOLN
2.0000 mg | INTRAVENOUS | Status: DC | PRN
  Administered 2021-01-16 – 2021-01-17 (×2): 2 mg via INTRAVENOUS
  Filled 2021-01-16: qty 1

## 2021-01-16 MED ORDER — PHENYLEPHRINE 40 MCG/ML (10ML) SYRINGE FOR IV PUSH (FOR BLOOD PRESSURE SUPPORT)
PREFILLED_SYRINGE | INTRAVENOUS | Status: DC | PRN
  Administered 2021-01-16: 80 ug via INTRAVENOUS

## 2021-01-16 SURGICAL SUPPLY — 107 items
BLADE CLIPPER SURG (BLADE) ×2 IMPLANT
BNDG COHESIVE 6X5 TAN STRL LF (GAUZE/BANDAGES/DRESSINGS) ×2 IMPLANT
CANISTER SUCT 3000ML PPV (MISCELLANEOUS) ×4 IMPLANT
CANNULA REDUC XI 12-8 STAPL (CANNULA) ×2
CANNULA REDUCER 12-8 DVNC XI (CANNULA) ×2 IMPLANT
CATH TROCAR 20FR (CATHETERS) IMPLANT
CHLORAPREP W/TINT 26 (MISCELLANEOUS) ×2 IMPLANT
CLIP VESOCCLUDE MED 6/CT (CLIP) IMPLANT
CNTNR URN SCR LID CUP LEK RST (MISCELLANEOUS) ×2 IMPLANT
CONN ST 1/4X3/8  BEN (MISCELLANEOUS)
CONN ST 1/4X3/8 BEN (MISCELLANEOUS) IMPLANT
CONT SPEC 4OZ STRL OR WHT (MISCELLANEOUS) ×2
DEFOGGER SCOPE WARMER CLEARIFY (MISCELLANEOUS) ×2 IMPLANT
DERMABOND ADVANCED (GAUZE/BANDAGES/DRESSINGS) ×1
DERMABOND ADVANCED .7 DNX12 (GAUZE/BANDAGES/DRESSINGS) ×1 IMPLANT
DRAIN CHANNEL 28F RND 3/8 FF (WOUND CARE) IMPLANT
DRAIN CHANNEL 32F RND 10.7 FF (WOUND CARE) IMPLANT
DRAPE ARM DVNC X/XI (DISPOSABLE) ×4 IMPLANT
DRAPE COLUMN DVNC XI (DISPOSABLE) ×1 IMPLANT
DRAPE CV SPLIT W-CLR ANES SCRN (DRAPES) ×2 IMPLANT
DRAPE DA VINCI XI ARM (DISPOSABLE) ×4
DRAPE DA VINCI XI COLUMN (DISPOSABLE) ×1
DRAPE HALF SHEET 40X57 (DRAPES) ×2 IMPLANT
DRAPE ORTHO SPLIT 77X108 STRL (DRAPES) ×1
DRAPE SURG ORHT 6 SPLT 77X108 (DRAPES) ×1 IMPLANT
ELECT BLADE 6.5 EXT (BLADE) IMPLANT
ELECT REM PT RETURN 9FT ADLT (ELECTROSURGICAL) ×2
ELECTRODE REM PT RTRN 9FT ADLT (ELECTROSURGICAL) ×1 IMPLANT
GAUZE KITTNER 4X5 RF (MISCELLANEOUS) ×2 IMPLANT
GAUZE SPONGE 4X4 12PLY STRL (GAUZE/BANDAGES/DRESSINGS) ×2 IMPLANT
GLOVE SURG ENC MOIS LTX SZ7.5 (GLOVE) ×4 IMPLANT
GOWN STRL REUS W/ TWL LRG LVL3 (GOWN DISPOSABLE) ×2 IMPLANT
GOWN STRL REUS W/ TWL XL LVL3 (GOWN DISPOSABLE) ×3 IMPLANT
GOWN STRL REUS W/TWL 2XL LVL3 (GOWN DISPOSABLE) ×2 IMPLANT
GOWN STRL REUS W/TWL LRG LVL3 (GOWN DISPOSABLE) ×2
GOWN STRL REUS W/TWL XL LVL3 (GOWN DISPOSABLE) ×3
HEMOSTAT SURGICEL 2X14 (HEMOSTASIS) ×6 IMPLANT
IRRIGATOR SUCT 8 DISP DVNC XI (IRRIGATION / IRRIGATOR) ×1 IMPLANT
IRRIGATOR SUCTION 8MM XI DISP (IRRIGATION / IRRIGATOR) ×1
KIT BASIN OR (CUSTOM PROCEDURE TRAY) ×2 IMPLANT
KIT SUCTION CATH 14FR (SUCTIONS) IMPLANT
KIT TURNOVER KIT B (KITS) ×2 IMPLANT
LOOP VESSEL SUPERMAXI WHITE (MISCELLANEOUS) IMPLANT
NEEDLE 22X1 1/2 (OR ONLY) (NEEDLE) ×2 IMPLANT
NEEDLE HYPO 25GX1X1/2 BEV (NEEDLE) ×2 IMPLANT
NS IRRIG 1000ML POUR BTL (IV SOLUTION) ×6 IMPLANT
OBTURATOR OPTICAL STANDARD 8MM (TROCAR)
OBTURATOR OPTICAL STND 8 DVNC (TROCAR)
OBTURATOR OPTICALSTD 8 DVNC (TROCAR) IMPLANT
PACK CHEST (CUSTOM PROCEDURE TRAY) ×2 IMPLANT
PAD ARMBOARD 7.5X6 YLW CONV (MISCELLANEOUS) ×10 IMPLANT
PORT ACCESS TROCAR AIRSEAL 12 (TROCAR) ×1 IMPLANT
PORT ACCESS TROCAR AIRSEAL 5M (TROCAR) ×1
RELOAD STAPLER 2.5X45 WHT DVNC (STAPLE) ×1 IMPLANT
RELOAD STAPLER 3.5X45 BLU DVNC (STAPLE) ×4 IMPLANT
RELOAD STAPLER 4.3X45 GRN DVNC (STAPLE) ×1 IMPLANT
SEAL CANN UNIV 5-8 DVNC XI (MISCELLANEOUS) ×2 IMPLANT
SEAL XI 5MM-8MM UNIVERSAL (MISCELLANEOUS) ×2
SEALANT SURG COSEAL 4ML (VASCULAR PRODUCTS) IMPLANT
SEALANT SURG COSEAL 8ML (VASCULAR PRODUCTS) IMPLANT
SET TRI-LUMEN FLTR TB AIRSEAL (TUBING) ×2 IMPLANT
SOLUTION ELECTROLUBE (MISCELLANEOUS) IMPLANT
SPONGE INTESTINAL PEANUT (DISPOSABLE) IMPLANT
STAPLE RELOAD 45 2.0 GRAY (STAPLE) ×2
STAPLE RELOAD 45 2.0 GRAY DVNC (STAPLE) ×2 IMPLANT
STAPLER 45 SUREFORM CVD (STAPLE) ×2
STAPLER 45 SUREFORM CVD DVNC (STAPLE) ×2 IMPLANT
STAPLER CANNULA SEAL DVNC XI (STAPLE) ×2 IMPLANT
STAPLER CANNULA SEAL XI (STAPLE) ×2
STAPLER RELOAD 2.5X45 WHITE (STAPLE) ×1
STAPLER RELOAD 2.5X45 WHT DVNC (STAPLE) ×1
STAPLER RELOAD 3.5X45 BLU DVNC (STAPLE) ×4
STAPLER RELOAD 3.5X45 BLUE (STAPLE) ×4
STAPLER RELOAD 4.3X45 GREEN (STAPLE) ×1
STAPLER RELOAD 4.3X45 GRN DVNC (STAPLE) ×1
STOPCOCK 4 WAY LG BORE MALE ST (IV SETS) ×2 IMPLANT
SUT MON AB 2-0 CT1 36 (SUTURE) IMPLANT
SUT PDS AB 1 CTX 36 (SUTURE) IMPLANT
SUT PROLENE 4 0 RB 1 (SUTURE)
SUT PROLENE 4-0 RB1 .5 CRCL 36 (SUTURE) IMPLANT
SUT SILK  1 MH (SUTURE) ×1
SUT SILK 1 MH (SUTURE) ×1 IMPLANT
SUT SILK 1 TIES 10X30 (SUTURE) IMPLANT
SUT SILK 2 0 SH (SUTURE) IMPLANT
SUT SILK 2 0SH CR/8 30 (SUTURE) IMPLANT
SUT VIC AB 1 CTX 36 (SUTURE)
SUT VIC AB 1 CTX36XBRD ANBCTR (SUTURE) IMPLANT
SUT VIC AB 2-0 CT1 27 (SUTURE) ×1
SUT VIC AB 2-0 CT1 TAPERPNT 27 (SUTURE) ×1 IMPLANT
SUT VIC AB 3-0 SH 27 (SUTURE) ×3
SUT VIC AB 3-0 SH 27X BRD (SUTURE) ×3 IMPLANT
SUT VICRYL 0 TIES 12 18 (SUTURE) ×2 IMPLANT
SUT VICRYL 0 UR6 27IN ABS (SUTURE) ×4 IMPLANT
SUT VICRYL 2 TP 1 (SUTURE) IMPLANT
SYR 10ML LL (SYRINGE) ×2 IMPLANT
SYR 20ML LL LF (SYRINGE) ×2 IMPLANT
SYR 50ML LL SCALE MARK (SYRINGE) ×2 IMPLANT
SYSTEM RETRIEVAL ANCHOR 12 (MISCELLANEOUS) ×2 IMPLANT
SYSTEM SAHARA CHEST DRAIN ATS (WOUND CARE) ×2 IMPLANT
TAPE CLOTH 4X10 WHT NS (GAUZE/BANDAGES/DRESSINGS) ×2 IMPLANT
TAPE PAPER 3X10 WHT MICROPORE (GAUZE/BANDAGES/DRESSINGS) ×2 IMPLANT
TIP APPLICATOR SPRAY EXTEND 16 (VASCULAR PRODUCTS) IMPLANT
TOWEL GREEN STERILE (TOWEL DISPOSABLE) ×2 IMPLANT
TRAY FOLEY MTR SLVR 16FR STAT (SET/KITS/TRAYS/PACK) ×2 IMPLANT
TROCAR BLADELESS 15MM (ENDOMECHANICALS) IMPLANT
TUBING EXTENTION W/L.L. (IV SETS) ×2 IMPLANT
WATER STERILE IRR 1000ML POUR (IV SOLUTION) ×2 IMPLANT

## 2021-01-16 SURGICAL SUPPLY — 44 items
ADAPTER BRONCH F/PENTAX (ADAPTER) ×3 IMPLANT
ADAPTER VALVE BIOPSY EBUS (MISCELLANEOUS) IMPLANT
ADPTR VALVE BIOPSY EBUS (MISCELLANEOUS)
BRUSH CYTOL CELLEBRITY 1.5X140 (MISCELLANEOUS) ×3 IMPLANT
BRUSH SUPERTRAX BIOPSY (INSTRUMENTS) IMPLANT
BRUSH SUPERTRAX NDL-TIP CYTO (INSTRUMENTS) ×3 IMPLANT
CANISTER SUCT 3000ML PPV (MISCELLANEOUS) ×3 IMPLANT
CHANNEL WORK EXTEND EDGE 180 (KITS) IMPLANT
CHANNEL WORK EXTEND EDGE 45 (KITS) IMPLANT
CHANNEL WORK EXTEND EDGE 90 (KITS) IMPLANT
CONT SPEC 4OZ CLIKSEAL STRL BL (MISCELLANEOUS) ×3 IMPLANT
COVER BACK TABLE 60X90IN (DRAPES) ×3 IMPLANT
FILTER STRAW FLUID ASPIR (MISCELLANEOUS) IMPLANT
FORCEPS BIOP SUPERTRX PREMAR (INSTRUMENTS) ×3 IMPLANT
GAUZE SPONGE 4X4 12PLY STRL (GAUZE/BANDAGES/DRESSINGS) ×3 IMPLANT
GLOVE SURG SS PI 7.5 STRL IVOR (GLOVE) ×6 IMPLANT
GOWN STRL REUS W/ TWL LRG LVL3 (GOWN DISPOSABLE) ×4 IMPLANT
GOWN STRL REUS W/TWL LRG LVL3 (GOWN DISPOSABLE) ×2
KIT CLEAN ENDO COMPLIANCE (KITS) ×3 IMPLANT
KIT LOCATABLE GUIDE (CANNULA) IMPLANT
KIT MARKER FIDUCIAL DELIVERY (KITS) IMPLANT
KIT PROCEDURE EDGE 180 (KITS) IMPLANT
KIT PROCEDURE EDGE 45 (KITS) IMPLANT
KIT PROCEDURE EDGE 90 (KITS) IMPLANT
KIT TURNOVER KIT B (KITS) ×3 IMPLANT
MARKER SKIN DUAL TIP RULER LAB (MISCELLANEOUS) ×3 IMPLANT
NEEDLE SUPERTRX PREMARK BIOPSY (NEEDLE) ×3 IMPLANT
NS IRRIG 1000ML POUR BTL (IV SOLUTION) ×3 IMPLANT
OIL SILICONE PENTAX (PARTS (SERVICE/REPAIRS)) ×3 IMPLANT
PAD ARMBOARD 7.5X6 YLW CONV (MISCELLANEOUS) ×6 IMPLANT
PATCHES PATIENT (LABEL) ×9 IMPLANT
SOL ANTI FOG 6CC (MISCELLANEOUS) ×2 IMPLANT
SOLUTION ANTI FOG 6CC (MISCELLANEOUS) ×1
SYR 20CC LL (SYRINGE) ×3 IMPLANT
SYR 20ML ECCENTRIC (SYRINGE) ×3 IMPLANT
SYR 50ML SLIP (SYRINGE) ×3 IMPLANT
TOWEL OR 17X24 6PK STRL BLUE (TOWEL DISPOSABLE) ×3 IMPLANT
TRAP SPECIMEN MUCOUS 40CC (MISCELLANEOUS) IMPLANT
TUBE CONNECTING 20X1/4 (TUBING) ×3 IMPLANT
UNDERPAD 30X30 (UNDERPADS AND DIAPERS) ×3 IMPLANT
VALVE BIOPSY  SINGLE USE (MISCELLANEOUS) ×1
VALVE BIOPSY SINGLE USE (MISCELLANEOUS) ×2 IMPLANT
VALVE SUCTION BRONCHIO DISP (MISCELLANEOUS) ×3 IMPLANT
WATER STERILE IRR 1000ML POUR (IV SOLUTION) ×3 IMPLANT

## 2021-01-16 NOTE — Transfer of Care (Signed)
Immediate Anesthesia Transfer of Care Note  Patient: Brandon [name]  Procedure(s) Performed: VIDEO BRONCHOSCOPY WITH ENDOBRONCHIAL NAVIGATION BRONCHIAL BIOPSIES BRONCHIAL BRUSHINGS VIDEO BRONCHOSCOPY WITH RADIAL ENDOBRONCHIAL ULTRASOUND FIDUCIAL DYE MARKING XI ROBOTIC ASSISTED THORASCOPY- RIGHT LOWER LOBE LOBECTOMY (Right: Chest)  Patient Location: PACU  Anesthesia Type:General  Level of Consciousness: drowsy and patient cooperative  Airway & Oxygen Therapy: Patient Spontanous Breathing and Patient connected to face mask oxygen  Post-op Assessment: Report given to RN and Post -op Vital signs reviewed and stable  Post vital signs: Reviewed and stable  Last Vitals:  Vitals Value Taken Time  BP 161/77   Temp    Pulse 91   Resp 18   SpO2 100     Last Pain:  Vitals:   01/16/21 1022  TempSrc:   PainSc: 0-No pain         Complications: No notable events documented.

## 2021-01-16 NOTE — Op Note (Signed)
      [address]       [place] [address]             [phone]        01/16/2021  Patient:  Brandon [name] Pre-Op Dx: Right lower lobe pulmonary nodule Post-op Dx: Right lower lobe non-small cell lung cancer Procedure: - Robotic assisted right video thoracoscopy -Right lower lobectomy - Mediastinal lymph node sampling - Intercostal nerve block  Surgeon and Role:      * Laruth Hanger, Lucile Crater, MD - Primary   Anesthesia  general EBL: 50 ml Blood Administration: None Specimen: Right lower lobe, hilar and mediastinal lymph node  Drains: 28 F argyle chest tube in left chest Counts: correct   Indications: 65 year old gentleman with a 1.3 cm right lower lobe cavitary nodule.  There is minimal uptake on PET/CT.  He also has a history of a CABG and is currently on Plavix.  I reviewed the imaging, and I think that this would be an appropriate case for combination procedure with Dr. Valeta Harms in which she will perform a navigational bronchoscopy with biopsy and marking followed by robotic assisted thoracoscopy with right lower lobe wedge possible lobectomy by me.  It is in a difficult location for straightforward wedge resection, thus I hope that we will be able to obtain a good sample on the navigational bronchoscopy.  The risks and benefits of this procedure been discussed, the patient is agreeable to proceed.  We will coordinate with Dr. Valeta Harms to obtain dates.  His Plavix will need to be held for 5 days.  Findings: Enlarged hilar lymph nodes.   Operative Technique: After the risks, benefits and alternatives were thoroughly discussed, the patient was brought to the operative theatre.  Anesthesia was induced, and the patient was then placed in a right lateral decubitus position and was prepped and draped in normal sterile fashion.  An appropriate surgical pause was performed, and pre-operative antibiotics were dosed accordingly.  We began by placing our 4 robotic ports  in the the 7th intercostal space targeting the hilum of the lung.  A 90mm assistant port was placed in the 9th intercostal space in the anterior axillary line.  The robot was then docked and all instruments were passed under direct visualization.    The lung was then retracted superiorly, and the inferior pulmonary ligament was divided.  The hilum was mobilized anteriorly and posteriorly.  We identified the lower lobe vein, and after careful isolation, it was divided with a vascular stapler.  We next moved to the fissure.  The artery was then divided with a vascular load stapler.  The bronchus to the lower lobe was then isolated.  After a test clamp, with good ventilation of the upper and middle lobes, the bronchus was then divided.  The fissure was completed, and the specimen was passed into an endocatch bag.  It was removed from the anterior access site.    Lymph nodes were then sampled at hilar and mediastinal levels.  The chest was irrigated, and an air leak test was performed.  An intercostal nerve block was performed under direct visualization.  A 28 F chest with then placed, and we watch the remaining lobes re-expand.  The skin and soft tissue were closed with absorbable suture    The patient tolerated the procedure without any immediate complications, and was transferred to the PACU in stable condition.  Brandon [name]

## 2021-01-16 NOTE — Anesthesia Procedure Notes (Signed)
Procedure Name: Intubation Date/Time: 01/16/2021 1:34 PM Performed by: Mariea Clonts, CRNA Pre-anesthesia Checklist: Patient identified, Emergency Drugs available, Suction available and Patient being monitored Patient Re-evaluated:Patient Re-evaluated prior to induction Oxygen Delivery Method: Circle System Utilized Preoxygenation: Pre-oxygenation with 100% oxygen Induction Type: IV induction Ventilation: Mask ventilation without difficulty Laryngoscope Size: Mac and 4 Grade View: Grade II Tube type: Oral Endobronchial tube: Left, Double lumen EBT, EBT position confirmed by fiberoptic bronchoscope, EBT position confirmed by auscultation and Bronchial Blocker placed under direct vision and 39 Fr Number of attempts: 2 Airway Equipment and Method: Stylet, Oral airway and Fiberoptic brochoscope Placement Confirmation: ETT inserted through vocal cords under direct vision, positive ETCO2 and breath sounds checked- equal and bilateral Tube secured with: Tape Dental Injury: Teeth and Oropharynx as per pre-operative assessment

## 2021-01-16 NOTE — Brief Op Note (Signed)
01/16/2021  4:47 PM  PATIENT:  Brandon [name]  65 y.o. male  PRE-OPERATIVE DIAGNOSIS:  Pulmonary nodule  POST-OPERATIVE DIAGNOSIS:  Pulmonary nodule  PROCEDURE:   XI ROBOTIC ASSISTED THORASCOPY- RIGHT LOWER LOBE LOBECTOMY   SURGEON:   Lightfoot, Lucile Crater, MD - Primary  PHYSICIAN ASSISTANT: Dorota Heinrichs  ASSISTANTS: OR staff   ANESTHESIA:   general  EBL:  50 mL   BLOOD ADMINISTERED:none  DRAINS:  69fr Blake    LOCAL MEDICATIONS USED:  OTHER Exparel  SPECIMEN:  Source of Specimen:  Right lower lobe, multiple lymph nodes  DISPOSITION OF SPECIMEN:  PATHOLOGY  COUNTS:  YES  DICTATION: .Dragon Dictation  PLAN OF CARE: Admit to inpatient   PATIENT DISPOSITION:  PACU - hemodynamically stable.   Delay start of Pharmacological VTE agent (>24hrs) due to surgical blood loss or risk of bleeding: yes

## 2021-01-16 NOTE — Progress Notes (Signed)
Pacu RN Report to floor given  Gave report to [facility]. [phone]. Discussed surgery, meds given in OR and Pacu, VS, IV fluids given, EBL, urine output, pain and other pertinent information. Also discussed if pt had any family or friends here or belongings with them. Discussed Chest tube to water seal, 185 BRB output, 4 lapsites w/ dermabond/CDI, one CT site w/ 4x4/tape, CDI. Last med given was Morphine 2mg  and Toradol 15mg  for pain 30 min before bringing pt up.   Pt exits my care.

## 2021-01-16 NOTE — Anesthesia Procedure Notes (Signed)
Procedure Name: Intubation Date/Time: 01/16/2021 12:29 PM Performed by: Carolan Clines, CRNA Pre-anesthesia Checklist: Patient identified, Emergency Drugs available, Suction available and Patient being monitored Patient Re-evaluated:Patient Re-evaluated prior to induction Oxygen Delivery Method: Circle System Utilized Preoxygenation: Pre-oxygenation with 100% oxygen Induction Type: IV induction Ventilation: Oral airway inserted - appropriate to patient size and Mask ventilation with difficulty Laryngoscope Size: Mac and 4 Grade View: Grade I Tube type: Oral Tube size: 8.5 mm Number of attempts: 1 Airway Equipment and Method: Stylet and Oral airway Placement Confirmation: ETT inserted through vocal cords under direct vision, positive ETCO2 and breath sounds checked- equal and bilateral Secured at: 23 cm Tube secured with: Tape Dental Injury: Teeth and Oropharynx as per pre-operative assessment  Comments: Difficult mask d/t beard.

## 2021-01-16 NOTE — Interval H&P Note (Signed)
History and Physical Interval Note:  01/16/2021 10:16 AM  Brandon [name]  has presented today for surgery, with the diagnosis of Lung nodule.  The various methods of treatment have been discussed with the patient and family. After consideration of risks, benefits and other options for treatment, the patient has consented to  Procedure(s) with comments: [place] (N/A) - ION, ICG+MB Dye Marking as a surgical intervention.  The patient's history has been reviewed, patient examined, no change in status, stable for surgery.  I have reviewed the patient's chart and labs.  Questions were answered to the patient's satisfaction.     [place]

## 2021-01-16 NOTE — Interval H&P Note (Signed)
History and Physical Interval Note:  01/16/2021 10:16 AM  Brandon [name]  has presented today for surgery, with the diagnosis of Pulm nodule.  The various methods of treatment have been discussed with the patient and family. After consideration of risks, benefits and other options for treatment, the patient has consented to  Procedure(s): XI ROBOTIC ASSISTED THORASCOPY- RIGHT LOWER LOBE WEDGE RESECTION, POSSIBLE LOBECTOMY (Right) as a surgical intervention.  The patient's history has been reviewed, patient examined, no change in status, stable for surgery.  I have reviewed the patient's chart and labs.  Questions were answered to the patient's satisfaction.     Ciana Simmon [name]

## 2021-01-16 NOTE — Op Note (Signed)
Video Bronchoscopy with Robotic Assisted Bronchoscopic Navigation   Date of Operation: 01/16/2021   Pre-op Diagnosis: Right lower lobe lung nodule  Post-op Diagnosis: Right lower lobe lung nodule  Surgeon: Garner Nash, DO   Assistants: None   Anesthesia: General endotracheal anesthesia  Operation: Flexible video fiberoptic bronchoscopy with robotic assistance and biopsies.  Estimated Blood Loss: Minimal  Complications: None  Indications and History: Brandon [name] is a 65 y.o. male with history of right lower lobe lung nodule. The risks, benefits, complications, treatment options and expected outcomes were discussed with the patient.  The possibilities of pneumothorax, pneumonia, reaction to medication, pulmonary aspiration, perforation of a viscus, bleeding, failure to diagnose a condition and creating a complication requiring transfusion or operation were discussed with the patient who freely signed the consent.    Description of Procedure: The patient was seen in the Preoperative Area, was examined and was deemed appropriate to proceed.  The patient was taken to [facility] endoscopy room 3, identified as Brandon [name] and the procedure verified as Flexible Video Fiberoptic Bronchoscopy.  A Time Out was held and the above information confirmed.   Prior to the date of the procedure a high-resolution CT scan of the chest was performed. Utilizing ION software program a virtual tracheobronchial tree was generated to allow the creation of distinct navigation pathways to the patient's parenchymal abnormalities. After being taken to the operating room general anesthesia was initiated and the patient  was orally intubated. The video fiberoptic bronchoscope was introduced via the endotracheal tube and a general inspection was performed which showed normal right and left lung anatomy, aspiration of the bilateral mainstems was completed to remove any remaining secretions. Robotic catheter inserted into  patient's endotracheal tube.   Target #1 right lower lobe: The distinct navigation pathways prepared prior to this procedure were then utilized to navigate to patient's lesion identified on CT scan. The robotic catheter was secured into place and the vision probe was withdrawn.  Lesion location was approximated using fluoroscopy and radial endobronchial ultrasound for peripheral targeting. Under fluoroscopic guidance transbronchial needle brushings, transbronchial needle biopsies, and transbronchial forceps biopsies were performed to be sent for cytology and pathology.  After tissue sampling a 50% / 50% mixture of methylene blue and ICG was injected into the lesion for fiducial dye marking.  At the end of the procedure a general airway inspection was performed and there was no evidence of active bleeding. The bronchoscope was removed.  The patient tolerated the procedure well. There was no significant blood loss and there were no obvious complications.   Samples Target #1: 1. Transbronchial needle brushings from RLL 2. Transbronchial Wang needle biopsies from RLL 3. Transbronchial forceps biopsies from RLL  Plans:  Patient was transferred under the care of anesthesia and Dr. Kipp Brood to operating room 10 for lobectomy.  Garner Nash, DO  Pulmonary Critical Care 01/16/2021 1:40 PM

## 2021-01-17 ENCOUNTER — Encounter (HOSPITAL_COMMUNITY): Payer: Self-pay | Admitting: Pulmonary Disease

## 2021-01-17 ENCOUNTER — Inpatient Hospital Stay (HOSPITAL_COMMUNITY): Payer: Medicare HMO

## 2021-01-17 DIAGNOSIS — J439 Emphysema, unspecified: Secondary | ICD-10-CM | POA: Diagnosis not present

## 2021-01-17 DIAGNOSIS — J939 Pneumothorax, unspecified: Secondary | ICD-10-CM | POA: Diagnosis not present

## 2021-01-17 DIAGNOSIS — J9811 Atelectasis: Secondary | ICD-10-CM | POA: Diagnosis not present

## 2021-01-17 DIAGNOSIS — I517 Cardiomegaly: Secondary | ICD-10-CM | POA: Diagnosis not present

## 2021-01-17 LAB — CBC
HCT: 36.7 % — ABNORMAL LOW (ref 39.0–52.0)
Hemoglobin: 12 g/dL — ABNORMAL LOW (ref 13.0–17.0)
MCH: 29.8 pg (ref 26.0–34.0)
MCHC: 32.7 g/dL (ref 30.0–36.0)
MCV: 91.1 fL (ref 80.0–100.0)
Platelets: 185 10*3/uL (ref 150–400)
RBC: 4.03 MIL/uL — ABNORMAL LOW (ref 4.22–5.81)
RDW: 13.3 % (ref 11.5–15.5)
WBC: 8.7 10*3/uL (ref 4.0–10.5)
nRBC: 0 % (ref 0.0–0.2)

## 2021-01-17 LAB — BASIC METABOLIC PANEL
Anion gap: 10 (ref 5–15)
BUN: 13 mg/dL (ref 8–23)
CO2: 25 mmol/L (ref 22–32)
Calcium: 8 mg/dL — ABNORMAL LOW (ref 8.9–10.3)
Chloride: 101 mmol/L (ref 98–111)
Creatinine, Ser: 1.16 mg/dL (ref 0.61–1.24)
GFR, Estimated: >60 mL/min (ref >60)
Glucose, Bld: 193 mg/dL — ABNORMAL HIGH (ref 70–99)
Potassium: 4 mmol/L (ref 3.5–5.1)
Sodium: 136 mmol/L (ref 135–145)

## 2021-01-17 MED ORDER — GUAIFENESIN ER 600 MG PO TB12
600.0000 mg | ORAL_TABLET | Freq: Two times a day (BID) | ORAL | Status: DC
  Administered 2021-01-17: 600 mg via ORAL
  Filled 2021-01-17: qty 1

## 2021-01-17 MED ORDER — GUAIFENESIN ER 600 MG PO TB12: 600.0000 mg | ORAL_TABLET | Freq: Two times a day (BID) | ORAL | Status: DC | PRN

## 2021-01-17 MED ORDER — TRAMADOL HCL 50 MG PO TABS: 50.0000 mg | ORAL_TABLET | Freq: Four times a day (QID) | ORAL | 0 refills | Status: DC | PRN

## 2021-01-17 MED ORDER — METOPROLOL TARTRATE 12.5 MG HALF TABLET
12.5000 mg | ORAL_TABLET | Freq: Two times a day (BID) | ORAL | Status: DC
  Administered 2021-01-17: 12.5 mg via ORAL
  Filled 2021-01-17: qty 1

## 2021-01-17 MED ORDER — AMLODIPINE BESYLATE 5 MG PO TABS: 5.0000 mg | ORAL_TABLET | Freq: Every day | ORAL | Status: DC

## 2021-01-17 NOTE — Progress Notes (Addendum)
      [address]       [place] [address]             [phone]       1 Day Post-Op Procedure(s) (LRB): XI ROBOTIC ASSISTED THORASCOPY- RIGHT LOWER LOBE LOBECTOMY (Right)  Subjective: Patient states pain under good control until about 3 am.  Objective: Vital signs in last 24 hours: Temp:  [97.8 F (36.6 C)-98.5 F (36.9 C)] 98.5 F (36.9 C) (09/30 0333) Pulse Rate:  [57-92] 77 (09/30 0333) Cardiac Rhythm: Normal sinus rhythm (09/29 2311) Resp:  [13-23] 17 (09/30 0333) BP: (115-161)/(65-85) 115/66 (09/30 0333) SpO2:  [90 %-100 %] 90 % (09/30 0333) Weight:  [96.6 kg] 96.6 kg (09/29 1001)     Intake/Output from previous day: 09/29 0701 - 09/30 0700 In: 2480 [P.O.:480; I.V.:1900; IV Piggyback:100] Out: 3775 [Urine:3325; Blood:50; Chest Tube:400]   Physical Exam:  Cardiovascular: RRR Pulmonary: Clear to auscultation on the left and coarse on the right Abdomen: Soft, non tender, bowel sounds present. Extremities: SCDs in place Wounds: Clean and dry.  No erythema or signs of infection. Chest Tube:to water seal, no air leak  Lab Results: CBC: Recent Labs    01/14/21 1017 01/17/21 0140  WBC 6.1 8.7  HGB 13.8 12.0*  HCT 41.7 36.7*  PLT 230 185   BMET:  Recent Labs    01/14/21 1017 01/17/21 0140  NA 137 136  K 4.0 4.0  CL 105 101  CO2 24 25  GLUCOSE 116* 193*  BUN 12 13  CREATININE 0.95 1.16  CALCIUM 8.6* 8.0*    PT/INR:  Recent Labs    01/14/21 1017  LABPROT 13.0  INR 1.0   ABG:  INR: Will add last result for INR, ABG once components are confirmed Will add last 4 CBG results once components are confirmed  Assessment/Plan:  1. CV - SR. On Amlodipine 5 mg daily and Lopressor 25 mg bid. With HR and BP, stop Amlodipine for now and decrease Lopressor for now. 2.  Pulmonary - On room air. Chest tube with 400 cc since surgery. Chest tube is to water seal, no air leak. CXR this am shows trace right apical pneumothorax. As  discussed with Dr. Kipp Brood, will remove chest tube. Obtain follow up chest x ray this afternoon. Encourage incentive spirometer. Await final pathology result 3. Expected post op blood loss anemia-H and H this am 12 and 36.7 4. Regarding pain control, increase Toradol to 30 mg 5. Possible discharge later today  Cooper Moroney M ZimmermanPA-C 01/17/2021,7:12 AM [phone]

## 2021-01-17 NOTE — Discharge Instructions (Signed)
Robot-Assisted Thoracic Surgery, Care After The following information offers guidance on how to care for yourself after your procedure. Your health care provider may also give you more specific instructions. If you have problems or questions, contact your health care provider. What can I expect after the procedure? After the procedure, it is common to have: Some pain and aches in the area of your surgical incisions. Pain when breathing in (inhaling) and coughing. Tiredness (fatigue). Trouble sleeping. Constipation. Follow these instructions at home: Medicines Take over-the-counter and prescription medicines only as told by your health care provider. If you were prescribed an antibiotic medicine, take it as told by your health care provider. Do not stop taking the antibiotic even if you start to feel better. Talk with your health care provider about safe and effective ways to manage pain after your procedure. Pain management should fit your specific health needs. Take pain medicine before pain becomes severe. Relieving and controlling your pain will make breathing easier for you. Ask your health care provider if the medicine prescribed to you requires you to avoid driving or using machinery. Eating and drinking Follow instructions from your health care provider about eating or drinking restrictions. These will vary depending on what procedure you had. Your health care provider may recommend: A liquid diet or soft diet for the first few days. Meals that are smaller and more frequent. A diet of fruits, vegetables, whole grains, and low-fat proteins. Limiting foods that are high in fat and processed sugar, including fried or sweet foods. Incision care Follow instructions from your health care provider about how to take care of your incisions. Make sure you: Wash your hands with soap and water for at least 20 seconds before and after you change your bandage (dressing). If soap and water are not  available, use hand sanitizer. Change your dressing as told by your health care provider. Leave stitches (sutures), skin glue, or adhesive strips in place. These skin closures may need to stay in place for 2 weeks or longer. If adhesive strip edges start to loosen and curl up, you may trim the loose edges. Do not remove adhesive strips completely unless your health care provider tells you to do that. Check your incision area every day for signs of infection. Check for: Redness, swelling, or more pain. Fluid or blood. Warmth. Pus or a bad smell. Activity Return to your normal activities as told by your health care provider. Ask your health care provider what activities are safe for you. Ask your health care provider when it is safe for you to drive. Do not lift anything that is heavier than 10 lb (4.5 kg), or the limit that you are told, until your health care provider says that it is safe. Rest as told by your health care provider. Avoid sitting for a long time without moving. Get up to take short walks every 1-2 hours. This is important to improve blood flow and breathing. Ask for help if you feel weak or unsteady. Do exercises as told by your health care provider. Pneumonia prevention  Do deep breathing exercises and cough regularly as directed. This helps clear mucus and opens your lungs. Doing this helps prevent lung infection (pneumonia). If you were given an incentive spirometer, use it as told. An incentive spirometer is a tool that measures how well you are filling your lungs with each breath. Coughing may hurt less if you try to support your chest. This is called splinting. Try one of these when you  cough: Hold a pillow against your chest. Place the palms of both hands on top of your incision area. Do not use any products that contain nicotine or tobacco. These products include cigarettes, chewing tobacco, and vaping devices, such as e-cigarettes. If you need help quitting, ask your  health care provider. Avoid secondhand smoke. General instructions If you have a drainage tube: Follow instructions from your health care provider about how to take care of it. Do not travel by airplane after your tube is removed until your health care provider tells you it is safe. You may need to take these actions to prevent or treat constipation: Drink enough fluid to keep your urine pale yellow. Take over-the-counter or prescription medicines. Eat foods that are high in fiber, such as beans, whole grains, and fresh fruits and vegetables. Limit foods that are high in fat and processed sugars, such as fried or sweet foods. Keep all follow-up visits. This is important. Contact a health care provider if: You have redness, swelling, or more pain around an incision. You have fluid or blood coming from an incision. An incision feels warm to the touch. You have pus or a bad smell coming from an incision. You have a fever. You cannot eat or drink without vomiting. Your pain medicine is not controlling your pain. Get help right away if: You have chest pain. Your heart is beating quickly. You have trouble breathing. You have trouble speaking. You are confused. You feel weak or dizzy, or you faint. These symptoms may represent a serious problem that is an emergency. Do not wait to see if the symptoms will go away. Get medical help right away. Call your local emergency services (911 in the [place] Do not drive yourself to the hospital. Summary Talk with your health care provider about safe and effective ways to manage pain after your procedure. Pain management should fit your specific health needs. Return to your normal activities as told by your health care provider. Ask your health care provider what activities are safe for you. Do deep breathing exercises and cough regularly as directed. This helps to clear mucus and prevent pneumonia. If it hurts to cough, ease pain by holding a pillow  against your chest or by placing the palms of both hands over your incisions. This information is not intended to replace advice given to you by your health care provider. Make sure you discuss any questions you have with your health care provider. Document Revised: 12/29/2019 Document Reviewed: 12/29/2019 Elsevier Patient Education  2022 [employer].

## 2021-01-17 NOTE — Anesthesia Postprocedure Evaluation (Signed)
Anesthesia Post Note  Patient: Brandon [name]  Procedure(s) Performed: VIDEO BRONCHOSCOPY WITH ENDOBRONCHIAL NAVIGATION BRONCHIAL BIOPSIES BRONCHIAL BRUSHINGS VIDEO BRONCHOSCOPY WITH RADIAL ENDOBRONCHIAL ULTRASOUND FIDUCIAL DYE MARKING XI ROBOTIC ASSISTED THORASCOPY- RIGHT LOWER LOBE LOBECTOMY (Right: Chest)     Patient location during evaluation: PACU Anesthesia Type: General Level of consciousness: awake and alert Pain management: pain level controlled Vital Signs Assessment: post-procedure vital signs reviewed and stable Respiratory status: spontaneous breathing, nonlabored ventilation, respiratory function stable and patient connected to nasal cannula oxygen Cardiovascular status: blood pressure returned to baseline and stable Postop Assessment: no apparent nausea or vomiting Anesthetic complications: no   No notable events documented.            Effie Berkshire

## 2021-01-17 NOTE — Discharge Summary (Signed)
Physician Discharge Summary  Patient ID: Brandon [name] MRN: [id number] DOB/AGE: 05/07/1955 65 y.o.  Admit date: 01/16/2021 Discharge date: 01/17/2021  Admission Diagnoses:  Pulmonary nodule Hypertension GERD Dyslipidemia CAD, s/p CABG 1.8cm left thyroid nodule    Discharge Diagnoses:   Pulmonary nodule Hypertension GERD Dyslipidemia CAD, s/p CABG 1.8cm left thyroid nodule S/P lobectomy of lung   Discharged Condition: stable  History of Present Illness: 65 year old gentleman with a 1.3 cm right lower lobe cavitary nodule.  There is minimal uptake on PET/CT.  He also has a history of a CABG and is currently on Plavix.  I reviewed the imaging, and I think that this would be an appropriate case for combination procedure with Dr. Valeta Harms in which she will perform a navigational bronchoscopy with biopsy and marking followed by robotic assisted thoracoscopy with right lower lobe wedge possible lobectomy by me.  It is in a difficult location for straightforward wedge resection, thus I hope that we will be able to obtain a good sample on the navigational bronchoscopy.  The risks and benefits of this procedure been discussed, the patient is agreeable to proceed.  We will coordinate with Dr. Valeta Harms to obtain dates.  His Plavix will need to be held for 5 days.   In regards to the thyroid nodule, once his procedures are completed we will work this up.  Hospital Course:  Brandon [name] was admitted hospital for elective surgery on 01/16/2021.  He was taken to endoscopy by Dr. June Leap for video bronchoscopy with robotic assisted bronchoscopic navigation.  Please see the operative note below for details.  Following this procedure, patient was taken straight to the operating suite where robotic assisted right lower lobectomy with lymph node sampling was accomplished.  Following the procedures, he was extubated in the OR and recovered in the postanesthesia care unit.  He was later transferred to 4 E.  progressive care.  He remained stable hemodynamically and from a respiratory standpoint.  There was no air leak noted in the chest tube on the first postoperative day. The postop day 1 chest x-ray showed mild subcu air in the right lower chest wall.  Right lung remained well-expanded.  The chest tube was removed and follow-up chest x-ray ordered for the same day.  This showed no evidence of pneumothorax. Mr. Pietrzyk was mobilized and tolerated this well.  Diet was resumed and this was also well-tolerated.  He was felt to be ready for discharge home today.  Follow-up has been arranged and instructions given.  Consults: Dr. June Leap  Significant Diagnostic Studies:   CLINICAL DATA:  Pulmonary nodule   EXAM: CT CHEST WITHOUT CONTRAST   TECHNIQUE: Multidetector CT imaging of the chest was performed using thin slice collimation for electromagnetic bronchoscopy planning purposes, without intravenous contrast.   COMPARISON:  PET-CT, 11/28/2020, CT chest, 10/09/2020   FINDINGS: Cardiovascular: Aortic atherosclerosis. Normal heart size. Three-vessel coronary artery calcifications and stents status post median sternotomy and CABG. No pericardial effusion.   Mediastinum/Nodes: No enlarged mediastinal, hilar, or axillary lymph nodes. Thyroid gland, trachea, and esophagus demonstrate no significant findings.   Lungs/Pleura: Similar appearance of a small cavitary lesion of the medial right lower lobe measuring 1.2 cm, with somewhat diminished wall thickening in comparison to prior examination (series 3, image 88). Mild, predominantly paraseptal emphysema. Diffuse bilateral bronchial wall thickening. No pleural effusion or pneumothorax.   Upper Abdomen: No acute abnormality.   Musculoskeletal: No chest wall mass or suspicious bone lesions identified.   IMPRESSION: 1. Similar  appearance of a small cavitary lesion of the medial right lower lobe measuring 1.2 cm, with somewhat diminished  wall thickening in comparison to prior examination. Behavior over this relatively short interval and lack of FDG avidity on prior examination favor an infectious or inflammatory etiology. Consider additional follow-up at 6 months to ensure stability. 2. Emphysema and diffuse bilateral bronchial wall thickening. 3. Coronary artery disease.   Aortic Atherosclerosis (ICD10-I70.0) and Emphysema (ICD10-J43.9).     Electronically Signed   By: Eddie Candle M.D.   On: 01/14/2021 10:36  Treatments:   Video Bronchoscopy with Robotic Assisted Bronchoscopic Navigation    Date of Operation: 01/16/2021    Pre-op Diagnosis: Right lower lobe lung nodule   Post-op Diagnosis: Right lower lobe lung nodule   Surgeon: Garner Nash, DO    Assistants: None    Anesthesia: General endotracheal anesthesia   Operation: Flexible video fiberoptic bronchoscopy with robotic assistance and biopsies.   Estimated Blood Loss: Minimal   Complications: None   Indications and History: Abdalla Naramore is a 65 y.o. male with history of right lower lobe lung nodule. The risks, benefits, complications, treatment options and expected outcomes were discussed with the patient.  The possibilities of pneumothorax, pneumonia, reaction to medication, pulmonary aspiration, perforation of a viscus, bleeding, failure to diagnose a condition and creating a complication requiring transfusion or operation were discussed with the patient who freely signed the consent.     Description of Procedure: The patient was seen in the Preoperative Area, was examined and was deemed appropriate to proceed.  The patient was taken to [facility] endoscopy room 3, identified as Brandon [name] and the procedure verified as Flexible Video Fiberoptic Bronchoscopy.  A Time Out was held and the above information confirmed.    Prior to the date of the procedure a high-resolution CT scan of the chest was performed. Utilizing ION software program a virtual  tracheobronchial tree was generated to allow the creation of distinct navigation pathways to the patient's parenchymal abnormalities. After being taken to the operating room general anesthesia was initiated and the patient  was orally intubated. The video fiberoptic bronchoscope was introduced via the endotracheal tube and a general inspection was performed which showed normal right and left lung anatomy, aspiration of the bilateral mainstems was completed to remove any remaining secretions. Robotic catheter inserted into patient's endotracheal tube.    Target #1 right lower lobe: The distinct navigation pathways prepared prior to this procedure were then utilized to navigate to patient's lesion identified on CT scan. The robotic catheter was secured into place and the vision probe was withdrawn.  Lesion location was approximated using fluoroscopy and radial endobronchial ultrasound for peripheral targeting. Under fluoroscopic guidance transbronchial needle brushings, transbronchial needle biopsies, and transbronchial forceps biopsies were performed to be sent for cytology and pathology.  After tissue sampling a 50% / 50% mixture of methylene blue and ICG was injected into the lesion for fiducial dye marking.   At the end of the procedure a general airway inspection was performed and there was no evidence of active bleeding. The bronchoscope was removed.  The patient tolerated the procedure well. There was no significant blood loss and there were no obvious complications.    Samples Target #1: 1. Transbronchial needle brushings from RLL 2. Transbronchial Wang needle biopsies from RLL 3. Transbronchial forceps biopsies from RLL   Plans:  Patient was transferred under the care of anesthesia and Dr. Kipp Brood to operating room 10 for lobectomy.   Leory Plowman  Marvel Plan, DO [facility] 01/16/2021 1:40 PM    ________________________________________________________  01/16/2021   4:47 PM    PATIENT:  Brandon [name]  65 y.o. male   PRE-OPERATIVE DIAGNOSIS:  Pulmonary nodule   POST-OPERATIVE DIAGNOSIS:  Pulmonary nodule   PROCEDURE:   XI ROBOTIC ASSISTED THORASCOPY- RIGHT LOWER LOBE LOBECTOMY    SURGEON:   Lightfoot, Lucile Crater, MD - Primary   PHYSICIAN ASSISTANT: Roddenberry   ASSISTANTS: OR staff    ANESTHESIA:   general   EBL:  50 mL    BLOOD ADMINISTERED:none   DRAINS:  51fr Blake     LOCAL MEDICATIONS USED:  OTHER Exparel   SPECIMEN:  Source of Specimen:  Right lower lobe, multiple lymph nodes   Discharge Exam: Blood pressure 120/68, pulse 87, temperature 98.3 F (36.8 C), temperature source Oral, resp. rate 19, height 5\' 8"  (1.727 m), weight 96.6 kg, SpO2 97 %.  Cardiovascular: RRR Pulmonary: Clear to auscultation on the left and coarse on the right Abdomen: Soft, non tender, bowel sounds present. Extremities: SCDs in place Wounds: Clean and dry.  No erythema or signs of infection. Chest Tube:to water seal, no air leak    Disposition: Discharge disposition: 01-Home or Self Care     Discharged to home in stable condition   Allergies as of 01/17/2021       Reactions   Antihistamines, Loratadine-type Hives   Any kind of antihistamines-hallucination, swelling up, itching, rash   Penicillins Other (See Comments)   Throat swelling up, hallucinations.   Shellfish Allergy Swelling   More as a kid, and now mild reaction-itchy eyes and nose   Iodine Nausea And Vomiting   Really nauseas and vomited.        Medication List     TAKE these medications    amLODipine 5 MG tablet Commonly known as: NORVASC Take 1 tablet (5 mg total) by mouth daily. Please make appt for future refills Thank you 1st attempt   aspirin EC 81 MG tablet Take 1 tablet (81 mg total) by mouth daily.   clopidogrel 75 MG tablet Commonly known as: PLAVIX Take 1 tablet (75 mg total) by mouth daily. Please make appt for future refills Thank you 1st attempt   DRY EYES  OP Place 1 drop into both eyes daily as needed (Dry eye).   ezetimibe 10 MG tablet Commonly known as: ZETIA Take 1 tablet (10 mg total) by mouth daily.   fenofibrate 145 MG tablet Commonly known as: Tricor Take 1 tablet (145 mg total) by mouth daily.   guaiFENesin 600 MG 12 hr tablet Commonly known as: MUCINEX Take 1 tablet (600 mg total) by mouth 2 (two) times daily as needed for to loosen phlegm or cough.   metoprolol tartrate 25 MG tablet Commonly known as: LOPRESSOR Take 1 tablet (25 mg total) by mouth 2 (two) times daily. Please make appt for future refills Thank you 1st attempt   multivitamin tablet Take 1 tablet by mouth daily.   nitroGLYCERIN 0.4 MG SL tablet Commonly known as: NITROSTAT Place 1 tablet (0.4 mg total) under the tongue every 5 (five) minutes as needed for chest pain.   omeprazole 20 MG capsule Commonly known as: PRILOSEC TAKE 1 CAPSULE BY MOUTH EVERY DAY   rosuvastatin 40 MG tablet Commonly known as: CRESTOR TAKE 1 TABLET BY MOUTH EVERY DAY (STOP ATORVASTATIN)   traMADol 50 MG tablet Commonly known as: ULTRAM Take 1 tablet (50 mg total) by mouth every 6 (six)  hours as needed (mild pain).        Follow-up Information     Lajuana Matte, MD. Go on 01/22/2021.   Specialty: Cardiothoracic Surgery Why: Your appointment is at 9:50am Contact information: [address] [place] [address] [phone]                 Signed:  Summary by: Lars Pinks PA-C   Inputed CXR results Dezarai Prew PA-C 01/17/2021, 4:45 PM

## 2021-01-21 LAB — SURGICAL PATHOLOGY

## 2021-01-21 LAB — CYTOLOGY - NON PAP

## 2021-01-22 ENCOUNTER — Other Ambulatory Visit: Payer: Self-pay

## 2021-01-22 ENCOUNTER — Ambulatory Visit: Payer: Self-pay | Admitting: Thoracic Surgery (Cardiothoracic Vascular Surgery)

## 2021-01-22 VITALS — BP 128/83 | HR 74 | Resp 20 | Ht 68.0 in | Wt 210.0 lb

## 2021-01-22 DIAGNOSIS — R911 Solitary pulmonary nodule: Secondary | ICD-10-CM

## 2021-01-22 MED ORDER — TRAMADOL HCL 50 MG PO TABS: 50.0000 mg | ORAL_TABLET | Freq: Four times a day (QID) | ORAL | 0 refills | Status: DC | PRN

## 2021-01-22 NOTE — Progress Notes (Unsigned)
[address]       [place] [address]             [phone]        Shashwat Winokur [place] Medical Record #[id number] Date of Birth: 02/02/1956  Referring: Garner Nash, DO Primary Care: Lesleigh Noe, MD Primary Cardiologist:Jayadeep Irish Lack, MD  Reason for visit:   follow-up  History of Present Illness:     Mr. Leonides Schanz comes in for his 1 week follow-up appointment.  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  Since he has been home he is done well.  He has some incisional pain is requested a refill and his tramadol.  Outside of that he is doing well.  He denies any shortness of breath or pleuritic pain.  Physical Exam: BP 128/83 (BP Location: Right Arm, Patient Position: Sitting)   Pulse 74   Resp 20   Ht 5\' 8"  (1.727 m)   Wt 210 lb (95.3 kg)   SpO2 91% Comment: RA  BMI 31.93 kg/m   Alert NAD Incision clean, stitches removed.   Abdomen soft, ND No peripheral edema   Diagnostic Studies & Laboratory data:  Path: FINAL MICROSCOPIC DIAGNOSIS:   A. LYMPH NODE, HILAR #1, EXCISION:  -  No carcinoma identified in one lymph node (0/1)   B. LYMPH NODE, HILAR #2, EXCISION:  -  No carcinoma identified in one lymph node (0/1)   C. LYMPH NODE, HILAR #3, EXCISION:  -  No carcinoma identified in one lymph node (0/1)   D. LYMPH NODE, LEVEL 8, EXCISION:  -  No nodal tissue identified   E. LYMPH NODE, LEVEL 7, EXCISION:  -  No carcinoma identified in one lymph node (0/1)   F. 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  -  Margins uninvolved by carcinoma  -  No carcinoma identified in fourteen lymph nodes (0/14)  -  See oncology table and comment below   ONCOLOGY TABLE:   LUNG: Resection   Synchronous Tumors: Not applicable  Total Number of Primary Tumors: 1  Procedure: Lobectomy  Specimen Laterality: Right  Tumor Focality: Unifocal   Tumor Site: Upper lobe of lung  Tumor Size: 2.0 cm  Histologic Type: Invasive squamous cell carcinoma  Visceral Pleura Invasion: Not identified  Direct Invasion of Adjacent Structures: No adjacent structures present  Lymphovascular Invasion: Not identified  Margins: All margins negative for invasive carcinoma  Treatment Effect: No known presurgical therapy  Regional Lymph Nodes:       Number of Lymph Nodes Involved: 0                            Nodal Sites with Tumor: N/A       Number of Lymph Nodes Examined: 18     Assessment / Plan:   65 year old male status post robotic assisted right lower lobectomy for a 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  I will see him back in 1 month in our PA clinic with a chest x-ray.  If all of his studies look good at that visit then he can be referred to the lung cancer center for ongoing surveillance.  I given his stage he will not require any adjuvant chemoradiation.  His case however will be discussed in our tumor board.  Lajuana Matte 01/22/2021 11:35 AM

## 2021-01-22 NOTE — Progress Notes (Signed)
Please schedule follow up appt with me in clinic. Next available 9min follow appt would be ok in a few weeks.   Thanks,  BLI  Garner Nash, DO  Pulmonary Critical Care 01/22/2021 3:44 PM

## 2021-01-23 NOTE — Telephone Encounter (Signed)
Noting further needed.   Will close to encounter.

## 2021-01-28 ENCOUNTER — Telehealth: Payer: Self-pay

## 2021-01-28 DIAGNOSIS — R918 Other nonspecific abnormal finding of lung field: Secondary | ICD-10-CM

## 2021-01-28 DIAGNOSIS — R911 Solitary pulmonary nodule: Secondary | ICD-10-CM

## 2021-01-30 ENCOUNTER — Other Ambulatory Visit: Payer: Self-pay | Admitting: *Deleted

## 2021-01-30 NOTE — Progress Notes (Signed)
The proposed treatment discussed in conference is for discussion purpose only and is not a binding recommendation. The patient was not been physically examined, or presented with their treatment options. Therefore, final treatment plans cannot be decided.  

## 2021-02-03 ENCOUNTER — Telehealth: Payer: Self-pay | Admitting: Interventional Cardiology

## 2021-02-03 ENCOUNTER — Other Ambulatory Visit: Payer: Self-pay | Admitting: *Deleted

## 2021-02-03 DIAGNOSIS — I10 Essential (primary) hypertension: Secondary | ICD-10-CM

## 2021-02-03 DIAGNOSIS — Z951 Presence of aortocoronary bypass graft: Secondary | ICD-10-CM

## 2021-02-03 MED ORDER — CLOPIDOGREL BISULFATE 75 MG PO TABS: 75.0000 mg | ORAL_TABLET | Freq: Every day | ORAL | 0 refills | Status: DC

## 2021-02-03 MED ORDER — EZETIMIBE 10 MG PO TABS: 10.0000 mg | ORAL_TABLET | Freq: Every day | ORAL | 0 refills | Status: DC

## 2021-02-03 MED ORDER — ROSUVASTATIN CALCIUM 40 MG PO TABS: ORAL_TABLET | ORAL | 0 refills | Status: DC

## 2021-02-03 MED ORDER — FENOFIBRATE 145 MG PO TABS: 145.0000 mg | ORAL_TABLET | Freq: Every day | ORAL | 0 refills | Status: DC

## 2021-02-03 MED ORDER — AMLODIPINE BESYLATE 5 MG PO TABS: 5.0000 mg | ORAL_TABLET | Freq: Every day | ORAL | 0 refills | Status: DC

## 2021-02-03 NOTE — Telephone Encounter (Signed)
*  STAT* If patient is at the pharmacy, call can be transferred to refill team.   1. Which medications need to be refilled? (please list name of each medication and dose if known)   rosuvastatin (CRESTOR) 40 MG tablet fenofibrate (TRICOR) 145 MG tablet ezetimibe (ZETIA) 10 MG tablet clopidogrel (PLAVIX) 75 MG tablet amLODipine (NORVASC) 5 MG tablet   2. Which pharmacy/location (including street and city if local pharmacy) is medication to be sent to? [id number] - [place], [place] - [address]   3. Do they need a 30 day or 90 day supply? 30 ds

## 2021-02-08 ENCOUNTER — Other Ambulatory Visit: Payer: Self-pay | Admitting: Interventional Cardiology

## 2021-02-24 ENCOUNTER — Other Ambulatory Visit: Payer: Self-pay | Admitting: Surgery

## 2021-02-24 DIAGNOSIS — R911 Solitary pulmonary nodule: Secondary | ICD-10-CM

## 2021-02-26 ENCOUNTER — Other Ambulatory Visit: Payer: Self-pay

## 2021-02-26 ENCOUNTER — Ambulatory Visit: Payer: Medicare HMO

## 2021-02-26 ENCOUNTER — Ambulatory Visit (INDEPENDENT_AMBULATORY_CARE_PROVIDER_SITE_OTHER): Payer: Self-pay | Admitting: Physician Assistant

## 2021-02-26 ENCOUNTER — Ambulatory Visit
Admission: RE | Admit: 2021-02-26 | Discharge: 2021-02-26 | Disposition: A | Discharge status: Home / Self Care | Payer: Medicare HMO | Source: Ambulatory Visit | Attending: Surgery | Admitting: Surgery

## 2021-02-26 VITALS — BP 120/77 | HR 60 | Resp 20 | Ht 68.0 in | Wt 204.0 lb

## 2021-02-26 DIAGNOSIS — J982 Interstitial emphysema: Secondary | ICD-10-CM | POA: Diagnosis not present

## 2021-02-26 DIAGNOSIS — R918 Other nonspecific abnormal finding of lung field: Secondary | ICD-10-CM | POA: Diagnosis not present

## 2021-02-26 DIAGNOSIS — Z09 Encounter for follow-up examination after completed treatment for conditions other than malignant neoplasm: Secondary | ICD-10-CM

## 2021-02-26 DIAGNOSIS — C3431 Malignant neoplasm of lower lobe, right bronchus or lung: Secondary | ICD-10-CM

## 2021-02-26 DIAGNOSIS — R911 Solitary pulmonary nodule: Secondary | ICD-10-CM

## 2021-02-26 NOTE — Progress Notes (Signed)
HPI:  Patient returns for 1 month follow up.  The patient is S/P Robotic Assisted Right Lower Lobectomy performed by Dr. Kipp Brood on 01/16/2021.  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  He was last evaluated by Dr. Kipp Brood on 01/22/2021 at which time he was doing well.  Today the patient overall is well.  He states that he has been pleasantly surprised by recovery from his surgery.  He states he was back out on his fishing boat about 10 days after he left the hospital.  He denies thoracotomy pain/paraesthesias.  He does have some shortness of breath if he gets walking very fast or after about 5 flights of stairs, but he recovers pretty quickly.  He also mentions he occasionally experiences these nightly episodes where he gets short of breath where he has to sit up and catch his breath.  He states its almost like he is holding his breath.  This mainly occurs after first getting into bed or getting up to go to the bathroom.   Current Outpatient Medications  Medication Sig Dispense Refill   amLODipine (NORVASC) 5 MG tablet Take 1 tablet (5 mg total) by mouth daily. keep appt for future refill. 30 tablet 0   Artificial Tear Ointment (DRY EYES OP) Place 1 drop into both eyes daily as needed (Dry eye).     aspirin EC 81 MG tablet Take 1 tablet (81 mg total) by mouth daily. 90 tablet 3   clopidogrel (PLAVIX) 75 MG tablet Take 1 tablet (75 mg total) by mouth daily. Keep appt for future refills. 30 tablet 0   ezetimibe (ZETIA) 10 MG tablet Take 1 tablet (10 mg total) by mouth daily. Keep appt for future refills. 30 tablet 0   fenofibrate (TRICOR) 145 MG tablet Take 1 tablet (145 mg total) by mouth daily. Keep appt for future refills. 30 tablet 0   guaiFENesin (MUCINEX) 600 MG 12 hr tablet Take 1 tablet (600 mg total) by mouth 2 (two) times daily as needed for to loosen  phlegm or cough.     metoprolol tartrate (LOPRESSOR) 25 MG tablet Take 1 tablet (25 mg total) by mouth 2 (two) times daily. Please make appt for future refills Thank you 1st attempt 60 tablet 0   Multiple Vitamin (MULTIVITAMIN) tablet Take 1 tablet by mouth daily.     nitroGLYCERIN (NITROSTAT) 0.4 MG SL tablet Place 1 tablet (0.4 mg total) under the tongue every 5 (five) minutes as needed for chest pain. 25 tablet 3   omeprazole (PRILOSEC) 20 MG capsule TAKE 1 CAPSULE BY MOUTH EVERY DAY 90 capsule 3   rosuvastatin (CRESTOR) 40 MG tablet TAKE 1 TABLET BY MOUTH EVERY DAY (STOP ATORVASTATIN) 30 tablet 0   traMADol (ULTRAM) 50 MG tablet Take 1 tablet (50 mg total) by mouth every 6 (six) hours as needed (mild pain). 50 tablet 0   No current facility-administered medications for this visit.    Physical Exam:  BP 120/77   Pulse 60   Resp 20   Ht 5\' 8"  (1.727 m)   Wt 204 lb (92.5 kg)   SpO2 95% Comment: RA  BMI 31.02 kg/m   Gen: no apparent distress Heart: RRR Lungs: CTA bilaterally Incisions: well healed  Diagnostic Tests:  CXR: No pneumothorax, there is some scarring/atelectasis in remaining portion of right lung, no significant pleural fluid is present   A/P:  Patient is S/P Robotic Assisted Right Lower Lobectomy for Stage IA Squamous Cell Carcinoma.  He has been discussed in Tumor board and there was no indication for adjuvant therapy.  Today he continues to be doing well.  Shortness of breath after activity and walking up several flights of stairs wouldn't be unexpected.  He was provided an incentive spirometer as he states he did not get on in the hospital and was instructed to use this several times per day. The patient will not need to follow up in our office.  He can continue surveillance at the cancer center under Dr. Pricilla Handler, PA-C [facility] [phone]

## 2021-02-27 ENCOUNTER — Telehealth: Payer: Self-pay | Admitting: Family Medicine

## 2021-02-27 NOTE — Chronic Care Management (AMB) (Signed)
  Chronic Care Management   Note  02/27/2021 Name: Brandon [name] MRN: [id number] DOB: 12-29-1955  Brandon [name] is a 65 y.o. year old male who is a primary care patient of Einar Pheasant, Jobe Marker, MD. I reached out to Jackquline Berlin by phone today in response to a referral sent by [name] PCP, Lesleigh Noe, MD.   [name] was given information about Chronic Care Management services today including:  CCM service includes personalized support from designated clinical staff supervised by his physician, including individualized plan of care and coordination with other care providers 24/7 contact phone numbers for assistance for urgent and routine care needs. Service will only be billed when office clinical staff spend 20 minutes or more in a month to coordinate care. Only one practitioner may furnish and bill the service in a calendar month. The patient may stop CCM services at any time (effective at the end of the month) by phone call to the office staff.   Patient agreed to services and verbal consent obtained.   Follow up plan:   Tatjana Secretary/administrator

## 2021-03-03 ENCOUNTER — Telehealth: Payer: Self-pay | Admitting: *Deleted

## 2021-03-03 ENCOUNTER — Other Ambulatory Visit: Payer: Self-pay | Admitting: Interventional Cardiology

## 2021-03-03 DIAGNOSIS — Z951 Presence of aortocoronary bypass graft: Secondary | ICD-10-CM

## 2021-03-03 NOTE — Telephone Encounter (Signed)
Mr. Brandon [name] called me and left me a vm message. I called him back.  I updated him on his appt. He states he was told that no appt until 6 months.  I explained how the appts work.  He was not near his calender and will call me back tomorrow to get an appt.

## 2021-03-03 NOTE — Telephone Encounter (Signed)
I received referral on Brandon [name]. I called to schedule but was unable to reach. I did leave a vm message with my name and phone number to call.

## 2021-03-04 ENCOUNTER — Telehealth: Payer: Self-pay | Admitting: *Deleted

## 2021-03-04 DIAGNOSIS — Z902 Acquired absence of lung [part of]: Secondary | ICD-10-CM

## 2021-03-04 NOTE — Telephone Encounter (Signed)
I received vm message from Mr. Aguayo to call. I called him and scheduled him to be seen with Dr. Julien Nordmann.  He verbalized understanding of appt.

## 2021-03-04 NOTE — Progress Notes (Signed)
Cardiology Office Note    Date:  03/05/2021   ID:  Jaelin Fackler, DOB [birth year], MRN [id number]  PCP:  Lesleigh Noe, MD  Cardiologist:  Larae Grooms, MD  Electrophysiologist:  None   Chief Complaint: f/u CAD, hypertriglyceridemia and hyperlipidemia  History of Present Illness:   Brandon [name] is a 65 y.o. male with history of HTN, CAD s/p CABG, HFrEF, hypertriglyceridemia, hyperlipidemia, and lung cancer.   In 2000, patient had MI and subsequently had CABG x 3 in 2000 in [place], [place]. @ [facility]. LIMA to LAD, radial graft to posterolateral OM, radial graft to RCA. Angina was described as back pain that then went to left arm. In 2005, had angina that felt like intermittent back pain ?stent of RCA unclear as to placement in graft or native vessel @ [place].  2015 [facility] cardiac cath patent grafts, patent stent in RCA,  medical management. LVEF by ventriculogram 40%. He reports he had additional stenting in the 2014-2016 time frame at [facility] in [place], [place]. 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 Kipp Brood on 01/16/21. He reports he has been d/c'ed by surgery and feels well. Gets winded when climbing steps but no problems with daily activities and able to fish for several hours without symptoms. He does not monitor BP at home but reports it has never been elevated, even when he was having MI in 2000. He reports that he was put on amlodipine for prevention. His PCP recently put him on omeprazole for a pain that he would get in the top of his stomach, mid-sternum and since starting that medication he has had no further pain in this area. He thinks that he was mistaking this discomfort for pain from his heart in the past. He is active daily doing work around the house. Prior to lobectomy he was walking 3-4 miles per day. He has  not resumed his walking but it is his goal to resume soon and does not wish to pursue cardiopulmonary rehab.   Today, he is here alone for his 1 year follow-up. He needs his cardiac medicines refilled. He denies chest pain, dizziness, lightheadedness, syncope, orthopnea, PND, edema, palpitations. He feels that his SOB is due to lobectomy done on 01/16/21 as he had no SOB prior to surgery. He will go to the [facility] in a few weeks for discussion of treatment plan for lung cancer. He has not had an echo that States he is grateful for the lung screening that was offered to him because the lung cancer was found, however he is skeptical of    Labwork independently reviewed: [facility] 01/17/21 Plt 185, ALT 24, K+ 4, Creat 1.16, hgb 12 08/12/20 LDL 50, HDL 34, Trigs 223   Past Medical History:  Diagnosis Date   Arthritis    Basal cell carcinoma    CAD (coronary artery disease) 06/14/2018   Cath on 01/23/2014 with LAD occlusion   Essential hypertension 06/08/2018   Gastroesophageal reflux disease 06/08/2018   GERD (gastroesophageal reflux disease)    Heart disease    Heart failure with reduced ejection fraction ([place]) 06/14/2018   ECHO 01/22/2014 with EF 40-45%   History of chicken pox    History of migraine    Hx of basal cell carcinoma 06/08/2018   04/2017. Left jaw   Hx of CABG  Hyperlipidemia    Inguinal hernia    Mixed hyperlipidemia 06/08/2018   Tobacco use disorder 06/08/2018    Past Surgical History:  Procedure Laterality Date   APPENDECTOMY  1960   BRONCHIAL BIOPSY  01/16/2021   Procedure: BRONCHIAL BIOPSIES;  Surgeon: Garner Nash, DO;  Location: [facility];  Service: Pulmonary;;   BRONCHIAL BRUSHINGS  01/16/2021   Procedure: BRONCHIAL BRUSHINGS;  Surgeon: Garner Nash, DO;  Location: [facility];  Service: Pulmonary;;   CORONARY ARTERY BYPASS GRAFT     x 3   FIDUCIAL MARKER PLACEMENT  01/16/2021   Procedure: FIDUCIAL DYE MARKING;  Surgeon: Garner Nash, DO;   Location: [facility];  Service: Pulmonary;;   INGUINAL HERNIA REPAIR  2008   SKIN CANCER EXCISION     Stent placed     in 2005 and in 2014   [place] N/A 01/16/2021   Procedure: [place];  Surgeon: Garner Nash, DO;  Location: [facility];  Service: Pulmonary;  Laterality: N/A;  ION, ICG+[place] Dye Marking   VIDEO BRONCHOSCOPY WITH RADIAL ENDOBRONCHIAL ULTRASOUND  01/16/2021   Procedure: VIDEO BRONCHOSCOPY WITH RADIAL ENDOBRONCHIAL ULTRASOUND;  Surgeon: Garner Nash, DO;  Location: [facility];  Service: Pulmonary;;    Current Medications: Current Meds  Medication Sig   amLODipine (NORVASC) 5 MG tablet Take 1 tablet (5 mg total) by mouth daily. keep appt for future refill.   Artificial Tear Ointment (DRY EYES OP) Place 1 drop into both eyes daily as needed (Dry eye).   aspirin EC 81 MG tablet Take 1 tablet (81 mg total) by mouth daily.   clopidogrel (PLAVIX) 75 MG tablet TAKE 1 TABLET (75 MG TOTAL) BY MOUTH DAILY. PLEASE MAKE APPT FOR FUTURE REFILLS THANK YOU 1ST ATTEMPT   ezetimibe (ZETIA) 10 MG tablet Take 1 tablet (10 mg total) by mouth daily. Keep appt for future refills.   fenofibrate (TRICOR) 145 MG tablet Take 1 tablet (145 mg total) by mouth daily. Keep appt for future refills.   metoprolol tartrate (LOPRESSOR) 25 MG tablet Take 1 tablet (25 mg total) by mouth 2 (two) times daily. Please make appt for future refills Thank you 1st attempt   Multiple Vitamin (MULTIVITAMIN) tablet Take 1 tablet by mouth daily.   nitroGLYCERIN (NITROSTAT) 0.4 MG SL tablet Place 1 tablet (0.4 mg total) under the tongue every 5 (five) minutes as needed for chest pain.   omeprazole (PRILOSEC) 20 MG capsule TAKE 1 CAPSULE BY MOUTH EVERY DAY   rosuvastatin (CRESTOR) 40 MG tablet TAKE 1 TABLET BY MOUTH EVERY DAY (STOP ATORVASTATIN)    Allergies:   Antihistamines, loratadine-type; Penicillins; Shellfish allergy; and Iodine    Social History   Socioeconomic History   Marital status: Divorced    Spouse name: Not on file   Number of children: 0   Years of education: Not on file   Highest education level: Not on file  Occupational History   Not on file  Tobacco Use   Smoking status: Former    Packs/day: 1.50    Years: 40.00    Pack years: 60.00    Types: Cigarettes    Start date: 21    Quit date: 04/21/2019    Years since quitting: 1.8   Smokeless tobacco: Former    Types: Chew    Quit date: 04/21/2003  Vaping Use   Vaping Use: Every day   Substances: Nicotine  Substance and Sexual Activity   Alcohol use:  Yes    Comment: 2 to 3 beers daily   Drug use: Yes    Types: Marijuana    Comment: once a week   Sexual activity: Yes    Birth control/protection: Post-menopausal  Other Topics Concern   Not on file  Social History Narrative   06/15/19   From: [place]: alone, moved to help care for his mother   Work: [employer]      Family: Mother still living, sister is nearby - Jenny Reichmann      Enjoys: fishing      Exercise: not currently   Diet: eating more than he should, trying to reduce sweets      Safety   Seat belts: Yes    Guns: Yes    Safe in relationships: Yes    Social Determinants of Radio broadcast assistant Strain: Not on file  Food Insecurity: Not on file  Transportation Needs: Not on file  Physical Activity: Not on file  Stress: Not on file  Social Connections: Not on file     Family History:  The patient's family history includes Colon cancer (age of onset: 73) in his father; Colon polyps in his sister; Dementia in his mother; Diabetes in his father; Heart attack in his maternal grandfather and maternal grandmother; Heart attack (age of onset: 69) in his mother; Heart attack (age of onset: 4) in his brother; Heart disease in his mother; Prostate cancer in his paternal grandfather; Stroke (age of onset: 14) in his paternal grandmother.  ROS:   Please see  the history of present illness. All other systems are reviewed and otherwise negative.    EKGs/Labs/Other Studies Reviewed:    Studies reviewed are outlined and summarized above. Reports included below if pertinent.  ECHO 2000  Inferior posterior hypokinesis with mildly depressed systolic function. Estimated ejection fraction 45%. Mild concentric hypertrophy. Inferior posterior hypokinesis with mildly depressed systolic function. No pericardial effusion.     EKG:  EKG is not ordered today  Recent Labs: 01/14/2021: ALT 24 01/17/2021: BUN 13; Creatinine, Ser 1.16; Hemoglobin 12.0; Platelets 185; Potassium 4.0; Sodium 136   Recent Lipid Panel    Component Value Date/Time   CHOL 120 08/12/2020 0833   TRIG 223 (H) 08/12/2020 0833   HDL 34 (L) 08/12/2020 0833   CHOLHDL 3.5 08/12/2020 0833   LDLCALC 50 08/12/2020 0833    PHYSICAL EXAM:    VS:  BP 110/60   Pulse (!) 56   Ht 5\' 8"  (1.727 m)   Wt 203 lb (92.1 kg)   SpO2 97%   BMI 30.87 kg/m   BMI: Body mass index is 30.87 kg/m.  GEN: Well nourished, well developed male in no acute distress HEENT: normocephalic, atraumatic Neck: no JVD, carotid bruits, or masses Cardiac: RRR; no murmurs, rubs, or gallops, no edema  Respiratory:  clear to auscultation bilaterally, normal work of breathing GI: soft, nontender, nondistended, + BS MS: no deformity or atrophy Skin: warm and dry, no rash Neuro:  Alert and Oriented x 3, Strength and sensation are intact, follows commands Psych: euthymic mood, full affect  Wt Readings from Last 3 Encounters:  03/05/21 203 lb (92.1 kg)  02/26/21 204 lb (92.5 kg)  01/22/21 210 lb (95.3 kg)     ASSESSMENT & PLAN:   CAD s/p CABG x 3: He denies chest discomfort or pain consistent with past MI. Is returning to regular daily activity s/p lobectomy 9/22. Feels that SOB is due to surgery,  denies having SOB prior to the procedure. Was started on omeprazole by PCP for pain at top of stomach/midsternal  region that has greatly improved since starting PPI. Advised that we will switch to Protonix due to omeprazole can decrease effectiveness of Plavix. Continue Plavix, aspirin, metoprolol.   Hypertension: BP is well-controlled. He does not check it at home but reports it has never been elevated.  Currently taking amlodipine which also may offer some some anti-anginal effect but would not be appropriate in the setting of reduced LVEF. Have requested patient call back to schedule echo and if LVEF remains low, would favor ACE-I or ARB for hypertension management. HFrEF: Cardiology records reveal echo from 2000 with LVEF 45%. No record of repeat echo since that time. Ventriculogram from cardiac cath 2015 reveals LVEF 40%. He denies chest discomfort, edema, or activity intolerance but does have SOB s/p lobectomy 9/22. I would like to repeat echo. Patient did not seem interested in having further testing done at this time so I did not push it while he was in the office. After he left, I called and left him a detailed message requesting that he call back to discuss as this is important in his management going forward. Continue metoprolol. If patient agreeable, would like to get him on GDMT for HF.  Hypertriglyceridemia: He drinks 3-4 beers daily and eats a diet high in bread and potatoes. Encouraged him to decrease alcohol intake and intake of simple carbohydrates. He is not fasting today and would like to return in 1 week for fasting lipids. Continue fenofibrate, statin, ezetimibe.  Hyperlipidemia: LDL 50 @ goal on 4/22. Will recheck today. Continue statin, ezetimibe.  Lung Cancer: s/p lobectomy 9/22. Discharged by CT surgery. Has upcoming appointment at the [facility].   Disposition: F/u with 1 year with Dr. Irish Lack   Medication Adjustments/Labs and Tests Ordered: Current medicines are reviewed at length with the patient today.  Concerns regarding medicines are outlined above. Medication changes, Labs and  Tests ordered today are summarized above and listed in the Patient Instructions accessible in Encounters.   Signed, Emmaline Life, NP  03/05/2021 12:16 PM    [facility] [address], [place], [place]  [address] Phone: [phone]; Fax: [phone]

## 2021-03-05 ENCOUNTER — Other Ambulatory Visit: Payer: Self-pay

## 2021-03-05 ENCOUNTER — Ambulatory Visit: Payer: Medicare HMO | Admitting: Nurse Practitioner

## 2021-03-05 ENCOUNTER — Encounter: Payer: Self-pay | Admitting: Nurse Practitioner

## 2021-03-05 VITALS — BP 110/60 | HR 56 | Ht 68.0 in | Wt 203.0 lb

## 2021-03-05 DIAGNOSIS — Z951 Presence of aortocoronary bypass graft: Secondary | ICD-10-CM

## 2021-03-05 DIAGNOSIS — E781 Pure hyperglyceridemia: Secondary | ICD-10-CM

## 2021-03-05 DIAGNOSIS — I2510 Atherosclerotic heart disease of native coronary artery without angina pectoris: Secondary | ICD-10-CM | POA: Diagnosis not present

## 2021-03-05 DIAGNOSIS — C3431 Malignant neoplasm of lower lobe, right bronchus or lung: Secondary | ICD-10-CM

## 2021-03-05 DIAGNOSIS — I10 Essential (primary) hypertension: Secondary | ICD-10-CM | POA: Diagnosis not present

## 2021-03-05 DIAGNOSIS — I5020 Unspecified systolic (congestive) heart failure: Secondary | ICD-10-CM | POA: Diagnosis not present

## 2021-03-05 DIAGNOSIS — E782 Mixed hyperlipidemia: Secondary | ICD-10-CM | POA: Diagnosis not present

## 2021-03-05 MED ORDER — METOPROLOL TARTRATE 25 MG PO TABS: 25.0000 mg | ORAL_TABLET | Freq: Two times a day (BID) | ORAL | 3 refills | Status: DC

## 2021-03-05 MED ORDER — CLOPIDOGREL BISULFATE 75 MG PO TABS: ORAL_TABLET | ORAL | 3 refills | Status: DC

## 2021-03-05 MED ORDER — FENOFIBRATE 145 MG PO TABS: 145.0000 mg | ORAL_TABLET | Freq: Every day | ORAL | 3 refills | Status: DC

## 2021-03-05 MED ORDER — EZETIMIBE 10 MG PO TABS: 10.0000 mg | ORAL_TABLET | Freq: Every day | ORAL | 3 refills | Status: DC

## 2021-03-05 MED ORDER — AMLODIPINE BESYLATE 5 MG PO TABS: 5.0000 mg | ORAL_TABLET | Freq: Every day | ORAL | 3 refills | Status: DC

## 2021-03-05 MED ORDER — ROSUVASTATIN CALCIUM 40 MG PO TABS: ORAL_TABLET | ORAL | 3 refills | Status: DC

## 2021-03-05 MED ORDER — PANTOPRAZOLE SODIUM 20 MG PO TBEC: 20.0000 mg | DELAYED_RELEASE_TABLET | Freq: Every day | ORAL | 3 refills | Status: DC

## 2021-03-05 NOTE — Patient Instructions (Signed)
Medication Instructions:  1.Stop omeprazole (Prilosec)  2.Start pantoprazole (Protonix) 20 mg, take one tablet by mouth daily *If you need a refill on your cardiac medications before your next appointment, please call your pharmacy*   Lab Work: Fasting lipid next Wednesday 03/12/21 If you have labs (blood work) drawn today and your tests are completely normal, you will receive your results only by: [place] (if you have MyChart) OR A paper copy in the mail If you have any lab test that is abnormal or we need to change your treatment, we will call you to review the results.    Follow-Up: At [facility], you and your health needs are our priority.  As part of our continuing mission to provide you with exceptional heart care, we have created designated Provider Care Teams.  These Care Teams include your primary Cardiologist (physician) and Advanced Practice Providers (APPs -  Physician Assistants and Nurse Practitioners) who all work together to provide you with the care you need, when you need it.   Your next appointment:   1 year(s)  The format for your next appointment:   In Person  Provider:   Larae Grooms, MD {

## 2021-03-10 ENCOUNTER — Telehealth: Payer: Self-pay

## 2021-03-10 NOTE — Progress Notes (Signed)
Chronic Care Management Pharmacy Assistant   Name: Brandon [name]  MRN: [id number] DOB: Aug 29, 1955  Reason for Encounter: CCM (Initial Questions)   Recent office visits:  10/03/2020 - Brandon Schooner, MD - Patient presented for medication refill and skin problem. Start: triamcinolone cream (KENALOG) 0.1 %. Change: omeprazole (PRILOSEC) 20 MG capsule. Referral for Lung Cancer Screening.   Recent consult visits:  03/05/2021 - Brandon Bame, NP Cardiology - Patient presented for 1 year follow-up. Start: pantoprazole (PROTONIX) 20 MG tablet. Stop: Omeprazole (PRILOSEC) 20 MG capsule. 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  01/14/2021 Brandon Bouillon, MD Cardiothoracic Surgery - Patient presented for 1 week follow-up appointment.  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  01/14/2021 Brandon Bouillon, MD Cardiothoracic Surgery - Patient presented for Pre-Admission Testing. No other information.  01/03/2021 Brandon Bouillon, MD Cardiothoracic Surgery - Patient presented for surgical evaluation of a right lower lobe pulmonary nodule.  12/11/2020 Brandon Leap, DO Pulmonary Disease - Patient presented for evaluation of abnormal CT imaging. Referral to Cardiothoracic Surgery and Pulmonary Function Test ordered.  11/28/2020 - Brandon Form, NP Radiology - Patient presented for NM PET Scan. No other information.  11/26/2020 - Brandon [name] Dermatology - Patient presented for Melanocytic Nevi of trunk, Benign lipomatous neoplasm of skin, subcutaneous tissue of left arm, Hemangioma of skin and subcutaneous tissue and Other seborrheic keratosis. No other information.  11/08/2020 - Brandon Pitter, NP Pulmonary Disease - Patient presented for Lung Cancer Screening.   Hospital visits:  Medication Reconciliation was completed by comparing  discharge summary, patient's EMR and Pharmacy list, and upon discussion with patient.  Admitted to the hospital on 01/16/2021 due to Video Bronchoscopy with Endobronchial Navigation and XI Robotic Assisted Thorascopy - right lower lobe lobectomy. Discharge date was 01/17/2021. Discharged from [facility]?Medications Started at [facility] Discharge:?? -started guaiFENesin (MUCINEX) 600 MG 12 hr tablet -started traMADol (ULTRAM) 50 MG tablet  Medication Changes at Hospital Discharge: None noted  Medications Discontinued at Hospital Discharge: None noted  Medications that remain the same after Hospital Discharge:??  -All other medications will remain the same.    Medications: Outpatient Encounter Medications as of 03/10/2021  Medication Sig   amLODipine (NORVASC) 5 MG tablet Take 1 tablet (5 mg total) by mouth daily.   Artificial Tear Ointment (DRY EYES OP) Place 1 drop into both eyes daily as needed (Dry eye).   aspirin EC 81 MG tablet Take 1 tablet (81 mg total) by mouth daily.   clopidogrel (PLAVIX) 75 MG tablet TAKE 1 TABLET (75 MG TOTAL) BY MOUTH DAILY.   ezetimibe (ZETIA) 10 MG tablet Take 1 tablet (10 mg total) by mouth daily.   fenofibrate (TRICOR) 145 MG tablet Take 1 tablet (145 mg total) by mouth daily.   guaiFENesin (MUCINEX) 600 MG 12 hr tablet Take 1 tablet (600 mg total) by mouth 2 (two) times daily as needed for to loosen phlegm or cough. (Patient not taking: Reported on 03/05/2021)   metoprolol tartrate (LOPRESSOR) 25 MG tablet Take 1 tablet (25 mg total) by mouth 2 (two) times daily.   Multiple Vitamin (MULTIVITAMIN) tablet Take 1 tablet by mouth daily.   nitroGLYCERIN (NITROSTAT) 0.4 MG SL tablet Place 1 tablet (0.4 mg total) under the tongue every 5 (five) minutes as needed for chest pain.   pantoprazole (  PROTONIX) 20 MG tablet Take 1 tablet (20 mg total) by mouth daily.   rosuvastatin (CRESTOR) 40 MG tablet TAKE 1 TABLET BY MOUTH EVERY DAY   traMADol  (ULTRAM) 50 MG tablet Take 1 tablet (50 mg total) by mouth every 6 (six) hours as needed (mild pain). (Patient not taking: Reported on 03/05/2021)   No facility-administered encounter medications on file as of 03/10/2021.   No results found for: HGBA1C, MICROALBUR   BP Readings from Last 3 Encounters:  03/05/21 110/60  02/26/21 120/77  01/22/21 128/83    Patient contacted to review initial questions prior to visit with Brandon [name].  Have you seen any other providers since your last visit with PCP? Yes  Any changes in your medications or health? Yes - Start: pantoprazole (PROTONIX) 20 MG tablet. Stop: Omeprazole (PRILOSEC) 20 MG capsule.  Any side effects from any medications? No  Do you have an symptoms or problems not managed by your medications? No  Any concerns about your health right now? No  Has your provider asked that you check blood pressure, blood sugar, or follow special diet at home? No  Do you get any type of exercise on a regular basis? Yes Patient states he is walking some not as much as in the spring and summer. Patient is on a breathing machine for his lungs.   Can you think of a goal you would like to reach for your health? No He is just taking everything one step at a time. He needs an Ultrasound for his thyroid that he is going to get in the new year.   Do you have any problems getting your medications? Yes  Is there anything that you would like to discuss during the appointment? No Patient did not see the need in having the appointment. He states he tried telling the woman who scheduled it no three times. He finally gave in and accepted it. I explained to patient why Dr. Einar [name] feels it is important for him to speak with Brandon [name]. Patient then explained how he could see that it would be a benefit.   Spoke with patient and reminded them to have all medications, supplements and any blood glucose and blood pressure readings available for review with  pharmacist, at their telephone visit on 03/17/2021 at 11:00.   Star Rating Drugs:  Medication:  Last Fill: Day Supply Rosuvastatin 40 mg 02/09/2021 30   Care Gaps: Annual wellness visit in last year? No Most Recent BP reading: 110/60 on 03/05/2021  Brandon [name], CPP notified  Marijean Niemann, [place] Pharmacy Assistant [phone]  Time Spent: 22 Minutes

## 2021-03-12 ENCOUNTER — Other Ambulatory Visit: Payer: Self-pay

## 2021-03-12 ENCOUNTER — Other Ambulatory Visit: Payer: Medicare HMO | Admitting: *Deleted

## 2021-03-12 DIAGNOSIS — Z951 Presence of aortocoronary bypass graft: Secondary | ICD-10-CM | POA: Diagnosis not present

## 2021-03-12 DIAGNOSIS — I10 Essential (primary) hypertension: Secondary | ICD-10-CM

## 2021-03-12 DIAGNOSIS — E782 Mixed hyperlipidemia: Secondary | ICD-10-CM

## 2021-03-12 LAB — LIPID PANEL
Chol/HDL Ratio: 3.3 ratio (ref 0.0–5.0)
Cholesterol, Total: 132 mg/dL (ref 100–199)
HDL: 40 mg/dL (ref >39)
LDL Chol Calc (NIH): 67 mg/dL (ref 0–99)
Triglycerides: 145 mg/dL (ref 0–149)
VLDL Cholesterol Cal: 25 mg/dL (ref 5–40)

## 2021-03-17 ENCOUNTER — Telehealth: Payer: Self-pay | Admitting: Nurse Practitioner

## 2021-03-17 ENCOUNTER — Telehealth: Payer: Self-pay

## 2021-03-17 ENCOUNTER — Other Ambulatory Visit: Payer: Self-pay

## 2021-03-17 ENCOUNTER — Ambulatory Visit (INDEPENDENT_AMBULATORY_CARE_PROVIDER_SITE_OTHER): Payer: Medicare HMO | Admitting: Pharmacist

## 2021-03-17 DIAGNOSIS — K219 Gastro-esophageal reflux disease without esophagitis: Secondary | ICD-10-CM

## 2021-03-17 DIAGNOSIS — I5020 Unspecified systolic (congestive) heart failure: Secondary | ICD-10-CM

## 2021-03-17 DIAGNOSIS — E782 Mixed hyperlipidemia: Secondary | ICD-10-CM

## 2021-03-17 DIAGNOSIS — I10 Essential (primary) hypertension: Secondary | ICD-10-CM

## 2021-03-17 DIAGNOSIS — I25118 Atherosclerotic heart disease of native coronary artery with other forms of angina pectoris: Secondary | ICD-10-CM

## 2021-03-17 NOTE — Telephone Encounter (Signed)
Patient states he is returning call from [place] about discussing an ultrasound.

## 2021-03-17 NOTE — Telephone Encounter (Signed)
Spoke with patient who saw me in clinic on 03/05/21. Following the visit, I left a message for him regarding need for repeat echo since most recent was 2000. He would like to proceed with ordering the echo and will check on his co-pay prior to scheduling. I gave him the phone number for our billing department and told him to call back with any additional questions. He thanked me for the call.

## 2021-03-17 NOTE — Progress Notes (Signed)
Chronic Care Management Pharmacy Note  03/17/2021 Name:  Brandon [name] MRN:  [id number] DOB:  [birth year]  Summary: -Pt reports adherence with medications as prescribed -Pt reports ezetimibe will go from tier 1 to tier 4 in Jan 2023 and will not be affordable -Pt reports fatigue with metoprolol. Most recent HR was 56 at cardiology appt 03/05/21, previously HR has been 60-70s -HF diagnosis is unclear - he has not had ECHO since moving here in 2019, cardiology has advised he schedule one but he has not done so yet. Discussed benefits of ECHO for guiding appropriate treatment. -Pt had elevated glucose (193) during admission for lobectomy 01/17/21. He endorses high carb diet (potatoes, bread). He has no record of A1c in the EMR and is at risk for prediabetes/diabetes. -Thyroid nodule was found on PET scan and pt was advised to schedule [place] to evaluate, he has not done so yet. No thyroid labs available in EMR.  Recommendations/Changes made from today's visit: -Pursue tier exception for ezetimibe in Jan 2023 -Advised pt to monitor pulse at home; if consistently in 50s may consider reduction in metoprolol dose -Advised pt to call cardiology to schedule ECHO, and schedule thyroid US when able -advised pt to get Prevnar at oncology appt 12/6 -Recommend A1c, thyroid labs at next PCP visit  Plan: -[place] will call patient 1 month to follow up home pulse readings -Pharmacist follow up televisit scheduled for 2 months   Subjective: Brandon [name] is an 65 y.o. year old male who is a primary patient of Cody, Jobe Marker, MD.  The CCM team was consulted for assistance with disease management and care coordination needs.    Engaged with patient by telephone for initial visit in response to provider referral for pharmacy case management and/or care coordination services.   Consent to Services:  The patient was given the following information about Chronic Care Management services today,  agreed to services, and gave verbal consent: 1. CCM service includes personalized support from designated clinical staff supervised by the primary care provider, including individualized plan of care and coordination with other care providers 2. 24/7 contact phone numbers for assistance for urgent and routine care needs. 3. Service will only be billed when office clinical staff spend 20 minutes or more in a month to coordinate care. 4. Only one practitioner may furnish and bill the service in a calendar month. 5.The patient may stop CCM services at any time (effective at the end of the month) by phone call to the office staff. 6. The patient will be responsible for cost sharing (co-pay) of up to 20% of the service fee (after annual deductible is met). Patient agreed to services and consent obtained.  Patient Care Team: Lesleigh Noe, MD as PCP - General (Family Medicine) Jettie Booze, MD as PCP - Cardiology (Cardiology) Charlton Haws, [facility] as Pharmacist (Pharmacist)  Patient is from [place], he lived in [place] for a while as well. He was in [employer] business. His sister lives here in [place] which is what prompted him to move here in 2019 to help take care of their aging mother, who has since passed.  Recent office visits: 10/03/2020 - Waunita Schooner, MD - Patient presented for medication refill and skin problem. Start: triamcinolone cream (KENALOG) 0.1 %. Change: omeprazole (PRILOSEC) 20 MG capsule. Referral for Lung Cancer Screening.   Recent consult visits: 03/05/21 NP Christen Bame (cardiology): f/u CAD, HFrEF. Advised repeat ECHO to assess HF status but  pt did not agree immediately. LDL goal < 55 would be ideal, can consider PCSK9-inhibitor. Changed omeprazole to pantoprazole 20 mg daily due DDI with clopidogrel. Refilled other medications.  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  01/22/2021 Melodie Bouillon, MD Cardiothoracic Surgery - Patient presented for 1 week follow-up appointment.  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  01/14/2021 Melodie Bouillon, MD Cardiothoracic Surgery - Patient presented for Pre-Admission Testing. No other information.  01/03/2021 Melodie Bouillon, MD Cardiothoracic Surgery - Patient presented for surgical evaluation of a right lower lobe pulmonary nodule.  12/11/2020 Leory Plowman Icard, DO Pulmonary Disease - Eval pulmonary nodule.  Referral to Cardiothoracic Surgery and Pulmonary Function Test ordered.  11/28/2020 - Eric Form, NP Radiology - Patient presented for NM PET Scan. No other information.  11/26/2020 - Danella Sensing Dermatology - Patient presented for Melanocytic Nevi of trunk, Benign lipomatous neoplasm of skin, subcutaneous tissue of left arm, Hemangioma of skin and subcutaneous tissue and Other seborrheic keratosis. No other information.  11/08/2020 - Geraldo Pitter, NP Pulmonary Disease - Patient presented for Lung Cancer Screening.   Hospital visits: Medication Reconciliation was completed by comparing discharge summary, patient's EMR and Pharmacy list, and upon discussion with patient.   Admitted to the hospital on 01/16/2021 due to Video Bronchoscopy with Endobronchial Navigation and XI Robotic Assisted Thorascopy - right lower lobe lobectomy. Discharge date was 01/17/2021. Discharged from [place]?Medications Started at [facility] Discharge:?? -started guaiFENesin (MUCINEX) 600 MG 12 hr tablet -started traMADol (ULTRAM) 50 MG tablet   Medications that remain the same after Hospital Discharge:??  -All other medications will remain the same.    Objective:  Lab Results  Component Value Date   CREATININE 1.16 01/17/2021   BUN 13 01/17/2021   GFRNONAA >60 01/17/2021   GFRAA 103 12/21/2018   NA 136 01/17/2021   K  4.0 01/17/2021   CALCIUM 8.0 (L) 01/17/2021   CO2 25 01/17/2021   GLUCOSE 193 (H) 01/17/2021    No results found for: HGBA1C, FRUCTOSAMINE, GFR, MICROALBUR  Last diabetic Eye exam: No results found for: HMDIABEYEEXA  Last diabetic Foot exam: No results found for: HMDIABFOOTEX   Lab Results  Component Value Date   CHOL 132 03/12/2021   HDL 40 03/12/2021   LDLCALC 67 03/12/2021   TRIG 145 03/12/2021   CHOLHDL 3.3 03/12/2021    Hepatic Function Latest Ref Rng & Units 01/14/2021 08/12/2020 05/09/2020  Total Protein 6.5 - 8.1 g/dL 6.3(L) 6.6 6.4  Albumin 3.5 - 5.0 g/dL 3.9 4.4 4.5  AST 15 - 41 U/L 24 19 16   ALT 0 - 44 U/L 24 19 25   Alk Phosphatase 38 - 126 U/L 31(L) 47 59  Total Bilirubin 0.3 - 1.2 mg/dL 0.7 0.5 0.3  Bilirubin, Direct 0.00 - 0.40 mg/dL - 0.16 0.10    No results found for: TSH, FREET4  CBC Latest Ref Rng & Units 01/17/2021 01/14/2021 12/21/2018  WBC 4.0 - 10.5 K/uL 8.7 6.1 6.4  Hemoglobin 13.0 - 17.0 g/dL 12.0(L) 13.8 15.7  Hematocrit 39.0 - 52.0 % 36.7(L) 41.7 46.6  Platelets 150 - 400 K/uL 185 230 196    No results found for: VD25OH  Clinical ASCVD: Yes  The 10-year ASCVD risk score (Arnett DK, et al., 2019) is: 11.5%   Values used to calculate the  score:     Age: 33 years     Sex: Male     Is Non-Hispanic African American: No     Diabetic: No     Tobacco smoker: No     Systolic Blood Pressure: 756 mmHg     Is BP treated: Yes     HDL Cholesterol: 40 mg/dL     Total Cholesterol: 132 mg/dL    Depression screen [facility] 2/9 06/08/2018  Decreased Interest 0  Down, Depressed, Hopeless 0  PHQ - 2 Score 0  Altered sleeping 1  Tired, decreased energy 2  Change in appetite 0  Feeling bad or failure about yourself  0  Trouble concentrating 0  Moving slowly or fidgety/restless 0  Suicidal thoughts 0  PHQ-9 Score 3  Difficult doing work/chores Not difficult at all     Social History   Tobacco Use  Smoking Status Former   Packs/day: 1.50   Years: 40.00    Pack years: 60.00   Types: Cigarettes   Start date: 29   Quit date: 04/21/2019   Years since quitting: 1.9  Smokeless Tobacco Former   Types: Chew   Quit date: 04/21/2003   BP Readings from Last 3 Encounters:  03/05/21 110/60  02/26/21 120/77  01/22/21 128/83   Pulse Readings from Last 3 Encounters:  03/05/21 (!) 56  02/26/21 60  01/22/21 74   Wt Readings from Last 3 Encounters:  03/05/21 203 lb (92.1 kg)  02/26/21 204 lb (92.5 kg)  01/22/21 210 lb (95.3 kg)   BMI Readings from Last 3 Encounters:  03/05/21 30.87 kg/m  02/26/21 31.02 kg/m  01/22/21 31.93 kg/m    Assessment/Interventions: Review of patient past medical history, allergies, medications, health status, including review of consultants reports, laboratory and other test data, was performed as part of comprehensive evaluation and provision of chronic care management services.   SDOH:  (Social Determinants of Health) assessments and interventions performed: Yes  SDOH Screenings   Alcohol Screen: Not on file  Depression (PHQ2-9): Not on file  Financial Resource Strain: Not on file  Food Insecurity: Not on file  Housing: Not on file  Physical Activity: Not on file  Social Connections: Not on file  Stress: Not on file  Tobacco Use: Medium Risk   Smoking Tobacco Use: Former   Smokeless Tobacco Use: Former   Passive Exposure: Not on [employer] consultant Needs: Not on file    CCM Care Plan  Allergies  Allergen Reactions   Antihistamines, Loratadine-Type Hives    Any kind of antihistamines-hallucination, swelling up, itching, rash   Penicillins Other (See Comments)    Throat swelling up, hallucinations.   Shellfish Allergy Swelling    More as a kid, and now mild reaction-itchy eyes and nose   Iodine Nausea And Vomiting    Really nauseas and vomited.    Medications Reviewed Today     Reviewed by Charlton Haws, [facility] (Pharmacist) on 03/17/21 at 1227  Med List Status: <None>   Medication Order  Taking? Sig Documenting Provider Last Dose Status Informant  amLODipine (NORVASC) 5 MG tablet [id number] Yes Take 1 tablet (5 mg total) by mouth daily. Swinyer, Lanice Schwab, NP Taking Active   Artificial Tear Ointment (DRY EYES OP) [id number] Yes Place 1 drop into both eyes daily as needed (Dry eye). [provider] Taking Active Self  aspirin EC 81 MG tablet [id number] Yes Take 1 tablet (81 mg total) by mouth daily. Jettie Booze, MD Taking Active Self  clopidogrel (PLAVIX) 75 MG tablet [id number] Yes TAKE 1 TABLET (75 MG TOTAL) BY MOUTH DAILY. Swinyer, Lanice Schwab, NP Taking Active   ezetimibe (ZETIA) 10 MG tablet [id number] Yes Take 1 tablet (10 mg total) by mouth daily. Swinyer, Lanice Schwab, NP Taking Active   fenofibrate (TRICOR) 145 MG tablet [id number] Yes Take 1 tablet (145 mg total) by mouth daily. Swinyer, Lanice Schwab, NP Taking Active   metoprolol tartrate (LOPRESSOR) 25 MG tablet [id number] Yes Take 1 tablet (25 mg total) by mouth 2 (two) times daily. Swinyer, Lanice Schwab, NP Taking Active   Multiple Vitamin (MULTIVITAMIN) tablet [id number] Yes Take 1 tablet by mouth daily. [provider] Taking Active Self  nitroGLYCERIN (NITROSTAT) 0.4 MG SL tablet [id number] Yes Place 1 tablet (0.4 mg total) under the tongue every 5 (five) minutes as needed for chest pain. Jettie Booze, MD Taking Active Self  pantoprazole (PROTONIX) 20 MG tablet [id number] Yes Take 1 tablet (20 mg total) by mouth daily. Swinyer, Lanice Schwab, NP Taking Active   rosuvastatin (CRESTOR) 40 MG tablet [id number] Yes TAKE 1 TABLET BY MOUTH EVERY DAY Swinyer, Lanice Schwab, NP Taking Active             Patient Active Problem List   Diagnosis Date Noted   S/P lobectomy of lung 01/16/2021   Pulmonary nodule 01/07/2021   Dermatitis 10/03/2020   Atypical chest pain 06/05/2020   Belching 06/05/2020   Antiplatelet or antithrombotic long-term use 06/05/2020   Family history of colon cancer in father  06/05/2020   ETD (eustachian tube dysfunction) 04/16/2020   Foot pain, bilateral 06/15/2019   Heart failure with reduced ejection fraction ([place]) 06/14/2018   CAD (coronary artery disease) 06/14/2018   Essential hypertension 06/08/2018   Mixed hyperlipidemia 06/08/2018   Hx of CABG 06/08/2018   Hx of basal cell carcinoma 06/08/2018   Gastroesophageal reflux disease 06/08/2018    Immunization History  Administered Date(s) Administered   Fluad Quad(high Dose 65+) 02/27/2020   Influenza, High Dose Seasonal PF 02/17/2019   Influenza,inj,Quad PF,6+ Mos 06/08/2018   PFIZER(Purple Top)SARS-COV-2 Vaccination 06/16/2019, 07/12/2019, 04/01/2020    Conditions to be addressed/monitored:  Hypertension, Hyperlipidemia, Heart Failure, Coronary Artery Disease, and GERD, Lung cancer  Care Plan : [place]  Updates made by Charlton Haws, [place] since 03/17/2021 12:00 AM     Problem: Hypertension, Hyperlipidemia, Heart Failure, Coronary Artery Disease, and GERD, Lung cancer   Priority: High     Long-Range Goal: Disease management   Start Date: 03/17/2021  Expected End Date: 03/17/2022  This Visit's Progress: On track  Priority: High  Note:   Current Barriers:  Unable to independently afford treatment regimen Unable to independently monitor therapeutic efficacy Suboptimal therapeutic regimen for Heart Failure  Pharmacist Clinical Goal(s):  Patient will verbalize ability to afford treatment regimen achieve adherence to monitoring guidelines and medication adherence to achieve therapeutic efficacy adhere to plan to optimize therapeutic regimen for Heart Failure as evidenced by report of adherence to recommended medication management changes through collaboration with PharmD and provider.   Interventions: 1:1 collaboration with Lesleigh Noe, MD regarding development and update of comprehensive plan of care as evidenced by provider attestation and  co-signature Inter-disciplinary care team collaboration (see longitudinal plan of care) Comprehensive medication review performed; medication list updated in electronic medical record  Hyperlipidemia / CAD (LDL goal < 55, TRIG < 150) -Not ideally controlled - given extensive CAD history and age, LDL goal < 55 would be ideal; LDL  is currently above ideal goal but still < 70; TRIG is at goal; pt endorses compliance with statin, ezetimibe, fenofibrate. -Pt reports ezetimibe will move from Tier 1 to Tier 4 starting Jan 2023 and will not be affordable -Hx LAD occlusion 01/2014; Hx CABGx3 in 2000; 2005 stent of RCA; 2014-2016 additional stenting per pt report -Current treatment: Clopidogrel 75 mg daily Rosuvastatin 40 mg daily Ezetimibe 10 mg daily - moving to Tier 4 Fenofibrate 145 mg daily Nitroglycerin 0.4 mg SL prn Aspirin 81 mg daily -Medications previously tried: atorvastatin, pravastatin -Current dietary patterns: meat, potatoes, bread -Current exercise habits: limited due to back/knee pain, SOB -Educated on Cholesterol goals; Benefits of statin for ASCVD risk reduction; Importance of limiting foods high in cholesterol; Exercise goal of 150 minutes per week; -Pursue tier exception for ezetimibe in Jan 2023 -Recommended to continue current medication  Heart Failure / Hypertension (Goal: BP goal < 130/80; prevent exacerbations) -Not ideally controlled - pt denies history of high BP, even during heart attacks his BP was normal; he was started on amlodipine and metoprolol after initial CABG in 2000; cardiology has recommended ECHO to evaluate HF status but pt has not scheduled it yet; HF diagnosis appears to stem from EF via cath in 2015 (outside records) -Pt reports metoprolol makes him tired; of note HR at recent cardiology appt was 56 -Last ejection fraction: 40-45% (Date: 01/2014 - per catheterization) -HF type: Systolic ? Unclear - overdue for repeat ECHO -Current treatment: Amlodipine  5 mg daily Metoprolol tartrate 25 mg BID -Medications previously tried: n/a -Current home BP/HR readings: not checking -Educated on heart failure diagnosis and methods for evaluating HF (ECHO vs cath); advised pt that amlodipine may not be the best option if he does have HFrEF, but would be reasonable if no HF or HFpEF; advised pt to schedule ECHO to help cardiologist determine best course of treatment -counseled on beta blocker side effects including fatigue; discussed HR was low at recent cardiology visit, advised pt to monitor pulse at home and if consistently in 50s may consider reduction in metoprolol dose -Recommended to continue current medication  GERD (Goal: manage symptoms) -Controlled - pt was recently switched from omeprazole to pantoprazole due to DDI with clopidogrel; he reports he had diarrhea for several weeks when starting omeprazole and now is having same issue with pantoprazole;  -Current treatment  Pantoprazole 20 mg daily -Medications previously tried: omeprazole  -Counseled on similar mechanisms for pantoprazole vs omeprazole, but no DDI with pantoprazole; advised pt to continue trial of pantoprazole as diarrhea while likely resolve within 1-2 weeks -Recommended to continue current medication  Lung Cancer (Goal: optimize therapy) -Controlled - pt has appt to establish care with oncology 03/25/21; of note thryoid nodule was found on recent PET scan and US thyroid was ordered, pt has yet to schedule -Dx Stage 1a Sept 2022; RLL lobectomy 01/16/21;  -quit smoking 04/2019 after 60+ pack-years -PFTs 01/01/21 - mild obstructive disease -Advised to keep appt with oncologist and schedule US thyroid  Elevated blood glucose (Goal: monitor for diabetes) -pt had glucose of 193 during admission for lobectomy 01/17/21. He has never had A1c checked. He endorses diet high in carbs (potatoes, bread) -Recommend A1c at next PCP visit  Health Maintenance -Vaccine gaps: Prevnar, Flu, TDAP,  Shingrix, covid booster -Pt reports he had flu shot [facility] within the past few weeks, can't remember day. He is open to getting Prevnar, advised he ask for it at upcoming oncology appt. -Current therapy:  Artificial  tears Multivitamin Tylenol PRN - 4 per day -Patient is satisfied with current therapy and denies issues -Recommended to continue current medication  Patient Goals/Self-Care Activities Patient will:  - take medications as prescribed as evidenced by patient report and record review -focus on medication adherence by routine -check blood pressure/pulse 2-3 times per week, document, and provide at future appointments -Schedule ECHO with cardiology      Medication Assistance:  Ezetimibe - Tier 4. Applied for tier exception 03/17/21  Compliance/Adherence/Medication fill history: Care Gaps: Hep C screening  Star-Rating Drugs: Rosuvastatin - LF 03/05/21 x 90 ds; [place] 99%  Patient's preferred pharmacy is:  [id number]Phone: [phone]: [phone] Uses pill box? Yes Pt endorses 100% compliance  We discussed: Benefits of medication synchronization, packaging and delivery as well as enhanced pharmacist oversight with [employer]. Patient decided to: Continue current medication management strategy  Care Plan and Follow Up Patient Decision:  Patient agrees to Care Plan and Follow-up.  Plan: Telephone follow up appointment with care management team member scheduled for:  2 months  LCharlene Brooke PharmD, BChi Lisbon HealthClinical Pharmacist [facility]Primary [facility] at [phone]

## 2021-03-17 NOTE — Patient Instructions (Signed)
Visit Information  Phone number for Pharmacist: [phone]  Thank you for meeting with me to discuss your medications! I look forward to working with you to achieve your health care goals. Below is a summary of what we talked about during the visit:   Goals Addressed             This Visit's Progress    Track and Manage My Blood Pressure/Pulse-Hypertension       Timeframe:  Long-Range Goal Priority:  High Start Date:  03/17/21                           Expected End Date:   03/17/22                    Follow Up Date Jan 2023   - check blood pressure 3 times per week - choose a place to take my blood pressure (home, clinic or office, retail store) - write blood pressure results in a log or diary    Why is this important?   You won't feel high blood pressure, but it can still hurt your blood vessels.  High blood pressure can cause heart or kidney problems. It can also cause a stroke.  Making lifestyle changes like losing a little weight or eating less salt will help.  Checking your blood pressure at home and at different times of the day can help to control blood pressure.  If the doctor prescribes medicine remember to take it the way the doctor ordered.  Call the office if you cannot afford the medicine or if there are questions about it.     Notes:         Care Plan : [employer]  Updates made by Charlton Haws, [place] since 03/17/2021 12:00 AM     Problem: Hypertension, Hyperlipidemia, Heart Failure, Coronary Artery Disease, and GERD, Lung cancer   Priority: High     Long-Range Goal: Disease management   Start Date: 03/17/2021  Expected End Date: 03/17/2022  This Visit's Progress: On track  Priority: High  Note:   Current Barriers:  Unable to independently afford treatment regimen Unable to independently monitor therapeutic efficacy Suboptimal therapeutic regimen for Heart Failure  Pharmacist Clinical Goal(s):  Patient will verbalize ability  to afford treatment regimen achieve adherence to monitoring guidelines and medication adherence to achieve therapeutic efficacy adhere to plan to optimize therapeutic regimen for Heart Failure as evidenced by report of adherence to recommended medication management changes through collaboration with PharmD and provider.   Interventions: 1:1 collaboration with Lesleigh Noe, MD regarding development and update of comprehensive plan of care as evidenced by provider attestation and co-signature Inter-disciplinary care team collaboration (see longitudinal plan of care) Comprehensive medication review performed; medication list updated in electronic medical record  Hyperlipidemia / CAD (LDL goal < 55, TRIG < 150) -Not ideally controlled - given extensive CAD history and age, LDL goal < 55 would be ideal; LDL is currently above ideal goal but still < 70; TRIG is at goal; pt endorses compliance with statin, ezetimibe, fenofibrate. -Pt reports ezetimibe will move from Tier 1 to Tier 4 starting Jan 2023 and will not be affordable -Hx LAD occlusion 01/2014; Hx CABGx3 in 2000; 2005 stent of RCA; 2014-2016 additional stenting per pt report -Current treatment: Clopidogrel 75 mg daily Rosuvastatin 40 mg daily Ezetimibe 10 mg daily - moving to Tier 4 Fenofibrate 145 mg daily Nitroglycerin 0.4  mg SL prn Aspirin 81 mg daily -Medications previously tried: atorvastatin, pravastatin -Current dietary patterns: meat, potatoes, bread -Current exercise habits: limited due to back/knee pain, SOB -Educated on Cholesterol goals; Benefits of statin for ASCVD risk reduction; Importance of limiting foods high in cholesterol; Exercise goal of 150 minutes per week; -Pursue tier exception for ezetimibe in Jan 2023 -Recommended to continue current medication  Heart Failure / Hypertension (Goal: BP goal < 130/80; prevent exacerbations) -Not ideally controlled - pt denies history of high BP, even during heart attacks his  BP was normal; he was started on amlodipine and metoprolol after initial CABG in 2000; cardiology has recommended ECHO to evaluate HF status but pt has not scheduled it yet; HF diagnosis appears to stem from EF via cath in 2015 (outside records) -Pt reports metoprolol makes him tired; of note HR at recent cardiology appt was 56 -Last ejection fraction: 40-45% (Date: 01/2014 - per catheterization) -HF type: Systolic ? Unclear - overdue for repeat ECHO -Current treatment: Amlodipine 5 mg daily Metoprolol tartrate 25 mg BID -Medications previously tried: n/a -Current home BP/HR readings: not checking -Educated on heart failure diagnosis and methods for evaluating HF (ECHO vs cath); advised pt that amlodipine may not be the best option if he does have HFrEF, but would be reasonable if no HF or HFpEF; advised pt to schedule ECHO to help cardiologist determine best course of treatment -counseled on beta blocker side effects including fatigue; discussed HR was low at recent cardiology visit, advised pt to monitor pulse at home and if consistently in 50s may consider reduction in metoprolol dose -Recommended to continue current medication  GERD (Goal: manage symptoms) -Controlled - pt was recently switched from omeprazole to pantoprazole due to DDI with clopidogrel; he reports he had diarrhea for several weeks when starting omeprazole and now is having same issue with pantoprazole;  -Current treatment  Pantoprazole 20 mg daily -Medications previously tried: omeprazole  -Counseled on similar mechanisms for pantoprazole vs omeprazole, but no DDI with pantoprazole; advised pt to continue trial of pantoprazole as diarrhea while likely resolve within 1-2 weeks -Recommended to continue current medication  Lung Cancer (Goal: optimize therapy) -Controlled - pt has appt to establish care with oncology 03/25/21; of note thryoid nodule was found on recent PET scan and US thyroid was ordered, pt has yet to  schedule -Dx Stage 1a Sept 2022; RLL lobectomy 01/16/21;  -quit smoking 04/2019 after 60+ pack-years -PFTs 01/01/21 - mild obstructive disease -Advised to keep appt with oncologist and schedule US thyroid  Elevated blood glucose (Goal: monitor for diabetes) -pt had glucose of 193 during admission for lobectomy 01/17/21. He has never had A1c checked. He endorses diet high in carbs (potatoes, bread) -Recommend A1c at next PCP visit  Health Maintenance -Vaccine gaps: Prevnar, Flu, TDAP, Shingrix, covid booster -[facility] the [facility], can't remember day. He is open to getting Prevnar, advised he ask for it at upcoming oncology appt. -Current therapy:  Artificial tears Multivitamin Tylenol PRN - 4 per day -Patient is satisfied with current therapy and denies issues -Recommended to continue current medication  Patient Goals/Self-Care Activities Patient will:  - take medications as prescribed as evidenced by patient report and record review -focus on medication adherence by routine -check blood pressure/pulse 2-3 times per week, document, and provide at future appointments -Schedule ECHO with cardiology      Mr. Grajales was given information about Chronic Care Management services today including:  CCM service includes personalized support from designated clinical staff supervised by his physician, including individualized plan of care and coordination with other care providers 24/7 contact phone numbers for assistance for urgent and routine care needs. Standard insurance, coinsurance, copays and deductibles apply for chronic care management only during months in which we provide at least 20 minutes of these services. Most insurances cover these services at 100%, however patients may be responsible for any copay, coinsurance and/or deductible if applicable. This service may help you avoid the need for more expensive face-to-face services. Only one practitioner  may furnish and bill the service in a calendar month. The patient may stop CCM services at any time (effective at the end of the month) by phone call to the office staff.  Patient agreed to services and verbal consent obtained.   Patient verbalizes understanding of instructions provided today and agrees to view in [place].  Telephone follow up appointment with pharmacy team member scheduled for: 2 months  Charlene Brooke, PharmD, [facility] Clinical Pharmacist [facility] [phone]

## 2021-03-17 NOTE — Progress Notes (Signed)
    Chronic Care Management Pharmacy Assistant   Name: Brandon [name]  MRN: [id number] DOB: July 04, 1955  Reason for Encounter: CCM (Influenza Vaccine)   [facility]21.   Charlene Brooke, CPP notified  Marijean Niemann, [place] Clinical Pharmacy Assistant [phone]  Time Spent:  5 Minutes

## 2021-03-19 DIAGNOSIS — I25118 Atherosclerotic heart disease of native coronary artery with other forms of angina pectoris: Secondary | ICD-10-CM

## 2021-03-19 DIAGNOSIS — I10 Essential (primary) hypertension: Secondary | ICD-10-CM

## 2021-03-19 DIAGNOSIS — I5020 Unspecified systolic (congestive) heart failure: Secondary | ICD-10-CM

## 2021-03-19 DIAGNOSIS — E782 Mixed hyperlipidemia: Secondary | ICD-10-CM

## 2021-03-25 ENCOUNTER — Other Ambulatory Visit: Payer: Self-pay

## 2021-03-25 ENCOUNTER — Inpatient Hospital Stay: Payer: Medicare HMO | Attending: Internal Medicine | Admitting: Internal Medicine

## 2021-03-25 ENCOUNTER — Inpatient Hospital Stay: Payer: Medicare HMO

## 2021-03-25 ENCOUNTER — Encounter: Payer: Self-pay | Admitting: *Deleted

## 2021-03-25 ENCOUNTER — Encounter: Payer: Self-pay | Admitting: Internal Medicine

## 2021-03-25 VITALS — BP 130/75 | HR 70 | Temp 98.4°F | Resp 18 | Ht 68.0 in | Wt 205.9 lb

## 2021-03-25 DIAGNOSIS — Z880 Allergy status to penicillin: Secondary | ICD-10-CM | POA: Diagnosis not present

## 2021-03-25 DIAGNOSIS — I2510 Atherosclerotic heart disease of native coronary artery without angina pectoris: Secondary | ICD-10-CM | POA: Diagnosis not present

## 2021-03-25 DIAGNOSIS — C3490 Malignant neoplasm of unspecified part of unspecified bronchus or lung: Secondary | ICD-10-CM | POA: Insufficient documentation

## 2021-03-25 DIAGNOSIS — Z85828 Personal history of other malignant neoplasm of skin: Secondary | ICD-10-CM | POA: Insufficient documentation

## 2021-03-25 DIAGNOSIS — Z818 Family history of other mental and behavioral disorders: Secondary | ICD-10-CM | POA: Diagnosis not present

## 2021-03-25 DIAGNOSIS — E782 Mixed hyperlipidemia: Secondary | ICD-10-CM | POA: Diagnosis not present

## 2021-03-25 DIAGNOSIS — Z9049 Acquired absence of other specified parts of digestive tract: Secondary | ICD-10-CM | POA: Diagnosis not present

## 2021-03-25 DIAGNOSIS — Z833 Family history of diabetes mellitus: Secondary | ICD-10-CM | POA: Diagnosis not present

## 2021-03-25 DIAGNOSIS — C3431 Malignant neoplasm of lower lobe, right bronchus or lung: Secondary | ICD-10-CM | POA: Diagnosis not present

## 2021-03-25 DIAGNOSIS — Z8249 Family history of ischemic heart disease and other diseases of the circulatory system: Secondary | ICD-10-CM | POA: Diagnosis not present

## 2021-03-25 DIAGNOSIS — I10 Essential (primary) hypertension: Secondary | ICD-10-CM | POA: Diagnosis not present

## 2021-03-25 DIAGNOSIS — E041 Nontoxic single thyroid nodule: Secondary | ICD-10-CM | POA: Insufficient documentation

## 2021-03-25 DIAGNOSIS — Z8371 Family history of colonic polyps: Secondary | ICD-10-CM | POA: Diagnosis not present

## 2021-03-25 DIAGNOSIS — J984 Other disorders of lung: Secondary | ICD-10-CM | POA: Insufficient documentation

## 2021-03-25 DIAGNOSIS — R0602 Shortness of breath: Secondary | ICD-10-CM | POA: Insufficient documentation

## 2021-03-25 DIAGNOSIS — Z800 Family history of malignant neoplasm of digestive organs: Secondary | ICD-10-CM | POA: Diagnosis not present

## 2021-03-25 DIAGNOSIS — Z823 Family history of stroke: Secondary | ICD-10-CM | POA: Insufficient documentation

## 2021-03-25 DIAGNOSIS — Z888 Allergy status to other drugs, medicaments and biological substances status: Secondary | ICD-10-CM | POA: Insufficient documentation

## 2021-03-25 DIAGNOSIS — R0609 Other forms of dyspnea: Secondary | ICD-10-CM | POA: Insufficient documentation

## 2021-03-25 DIAGNOSIS — Z902 Acquired absence of lung [part of]: Secondary | ICD-10-CM

## 2021-03-25 DIAGNOSIS — Z87891 Personal history of nicotine dependence: Secondary | ICD-10-CM | POA: Insufficient documentation

## 2021-03-25 DIAGNOSIS — Z8042 Family history of malignant neoplasm of prostate: Secondary | ICD-10-CM | POA: Diagnosis not present

## 2021-03-25 DIAGNOSIS — K219 Gastro-esophageal reflux disease without esophagitis: Secondary | ICD-10-CM | POA: Insufficient documentation

## 2021-03-25 DIAGNOSIS — Z79899 Other long term (current) drug therapy: Secondary | ICD-10-CM | POA: Insufficient documentation

## 2021-03-25 LAB — CBC WITH DIFFERENTIAL (CANCER CENTER ONLY)
Abs Immature Granulocytes: 0.02 10*3/uL (ref 0.00–0.07)
Basophils Absolute: 0 10*3/uL (ref 0.0–0.1)
Basophils Relative: 1 %
Eosinophils Absolute: 0.2 10*3/uL (ref 0.0–0.5)
Eosinophils Relative: 3 %
HCT: 42.4 % (ref 39.0–52.0)
Hemoglobin: 13.9 g/dL (ref 13.0–17.0)
Immature Granulocytes: 0 %
Lymphocytes Relative: 32 %
Lymphs Abs: 2.3 10*3/uL (ref 0.7–4.0)
MCH: 29.3 pg (ref 26.0–34.0)
MCHC: 32.8 g/dL (ref 30.0–36.0)
MCV: 89.5 fL (ref 80.0–100.0)
Monocytes Absolute: 0.6 10*3/uL (ref 0.1–1.0)
Monocytes Relative: 9 %
Neutro Abs: 4.1 10*3/uL (ref 1.7–7.7)
Neutrophils Relative %: 55 %
Platelet Count: 241 10*3/uL (ref 150–400)
RBC: 4.74 MIL/uL (ref 4.22–5.81)
RDW: 13 % (ref 11.5–15.5)
WBC Count: 7.3 10*3/uL (ref 4.0–10.5)
nRBC: 0 % (ref 0.0–0.2)

## 2021-03-25 LAB — CMP (CANCER CENTER ONLY)
ALT: 20 U/L (ref 0–44)
AST: 18 U/L (ref 15–41)
Albumin: 4.2 g/dL (ref 3.5–5.0)
Alkaline Phosphatase: 45 U/L (ref 38–126)
Anion gap: 11 (ref 5–15)
BUN: 18 mg/dL (ref 8–23)
CO2: 25 mmol/L (ref 22–32)
Calcium: 9.1 mg/dL (ref 8.9–10.3)
Chloride: 105 mmol/L (ref 98–111)
Creatinine: 1.15 mg/dL (ref 0.61–1.24)
GFR, Estimated: >60 mL/min (ref >60)
Glucose, Bld: 97 mg/dL (ref 70–99)
Potassium: 4.3 mmol/L (ref 3.5–5.1)
Sodium: 141 mmol/L (ref 135–145)
Total Bilirubin: 0.5 mg/dL (ref 0.3–1.2)
Total Protein: 6.9 g/dL (ref 6.5–8.1)

## 2021-03-25 NOTE — Progress Notes (Signed)
Oncology Nurse Navigator Documentation  Oncology Nurse Navigator Flowsheets 03/25/2021  Abnormal Finding Date 11/10/2020  Confirmed Diagnosis Date 01/16/2021  Diagnosis Status Confirmed Diagnosis Complete  Planned Course of Treatment No Treatment/Observation  Expected Surgery Date 01/16/2021  Surgery Actual Start Date: 01/16/2021  Post Navigation: Continue to Follow Patient? No  Reason Not Navigating Patient: No Treatment, Observation Only  Navigator Location [facility]  Navigator Encounter Type Clinic/MDC;Initial MedOnc  Treatment Initiated Date 01/16/2021  Patient Visit Type Initial;MedOnc  Treatment Phase Other  Barriers/Navigation Needs Education  Education Newly Diagnosed Cancer Education;Other  Interventions Education;Psycho-Social Support  Acuity Level 3-Moderate Needs (3-4 Barriers Identified)  Education Method Verbal;Written  Time Spent with Patient 33

## 2021-03-25 NOTE — Progress Notes (Signed)
[place] [phone]   [phone]  CONSULT NOTE  REFERRING PHYSICIAN: Dr. Melodie Bouillon  REASON FOR CONSULTATION:  65 years old white male recently diagnosed with lung cancer  HPI Brandon [name] is a 65 y.o. male with past medical history significant for hypertension, coronary artery disease status post CABG, GERD, basal cell carcinoma, dyslipidemia and suspicious congestive heart failure as well as recent diagnosis of lung cancer.  The patient mentioned that he was seen by the pulmonary medicine and consider for CT screening of the chest because of his long history of smoking.  The scan was performed on November 08, 2020 and showed right lower lobe suspicious lung nodule measuring 1.34 cm.  This was followed by a PET scan on 11/28/2020 and that showed 1.3 cm irregular cavitary nodule identified with no hypermetabolism on the PET imaging but given the cavitary nature of the lesion with relatively minimal soft tissue component neoplasm was not excluded.  There was also 1.8 cm left thyroid nodule without hypermetabolism.  The patient was seen by Dr. Valeta Harms and Dr. Kipp Brood and he underwent video bronchoscopy with robotic assisted bronchoscopic navigation under the care of Dr. Valeta Harms followed by robotic VATS with right lower lobectomy and mediastinal lymph node sampling under the care of Dr. Kipp Brood. 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 The patient is recovering well from his surgery and he was referred to me today for evaluation and discussion of his condition and monitoring by Dr. Kipp Brood. When seen today he is feeling fine with no concerning complaints except for occasional shortness of breath with exertion.  He denied having any chest pain, cough  or hemoptysis.  He denied having any fever or chills.  He has no nausea, vomiting, diarrhea or constipation.  He has no headache or visual changes.  He has no weight loss or night sweats. Family history significant for mother with dementia and father had colon cancer at age 77. The patient is single and has no children.  He worked in [employer] most of his life.  He has a history of smoking up to 2 pack/day for around 50 years and quit in January 2022.  He currently using e-cigarettes.  He drinks up to 2 beers every day and no history of drug abuse.  HPI  Past Medical History:  Diagnosis Date   Arthritis    Basal cell carcinoma    CAD (coronary artery disease) 06/14/2018   Cath on 01/23/2014 with LAD occlusion   Essential hypertension 06/08/2018   Gastroesophageal reflux disease 06/08/2018   GERD (gastroesophageal reflux disease)    Heart disease    Heart failure with reduced ejection fraction ([place]) 06/14/2018   ECHO 01/22/2014 with EF 40-45%   History of chicken pox    History of migraine    Hx of basal cell carcinoma 06/08/2018   04/2017. Left jaw   Hx of CABG    Hyperlipidemia    Inguinal hernia    Mixed hyperlipidemia 06/08/2018   Tobacco use disorder 06/08/2018    Past Surgical History:  Procedure Laterality Date   APPENDECTOMY  1960   BRONCHIAL BIOPSY  01/16/2021   Procedure: BRONCHIAL BIOPSIES;  Surgeon: Garner Nash, DO;  Location: [facility];  Service: Pulmonary;;   BRONCHIAL BRUSHINGS  01/16/2021   Procedure: BRONCHIAL BRUSHINGS;  Surgeon: June Leap  L, DO;  Location: [facility];  Service: Pulmonary;;   CORONARY ARTERY BYPASS GRAFT     x 3   FIDUCIAL MARKER PLACEMENT  01/16/2021   Procedure: FIDUCIAL DYE MARKING;  Surgeon: Garner Nash, DO;  Location: [facility];  Service: Pulmonary;;   INGUINAL HERNIA REPAIR  2008   SKIN CANCER EXCISION     Stent placed     in 2005 and in 2014   Echo N/A 01/16/2021    Procedure: [place];  Surgeon: Garner Nash, DO;  Location: [facility];  Service: Pulmonary;  Laterality: N/A;  ION, ICG+MB Dye Marking   VIDEO BRONCHOSCOPY WITH RADIAL ENDOBRONCHIAL ULTRASOUND  01/16/2021   Procedure: VIDEO BRONCHOSCOPY WITH RADIAL ENDOBRONCHIAL ULTRASOUND;  Surgeon: Garner Nash, DO;  Location: [facility];  Service: Pulmonary;;    Family History  Problem Relation Age of Onset   Dementia Mother    Heart attack Mother 44   Heart disease Mother    Colon cancer Father 45   Diabetes Father    Colon polyps Sister    Heart attack Brother 52   Heart attack Maternal Grandmother    Heart attack Maternal Grandfather    Stroke Paternal Grandmother 75   Prostate cancer Paternal Grandfather     Social History Social History   Tobacco Use   Smoking status: Former    Packs/day: 1.50    Years: 40.00    Pack years: 60.00    Types: Cigarettes    Start date: 14    Quit date: 04/21/2019    Years since quitting: 1.9   Smokeless tobacco: Former    Types: Chew    Quit date: 04/21/2003  Vaping Use   Vaping Use: Every day   Substances: Nicotine  Substance Use Topics   Alcohol use: Yes    Comment: 2 to 3 beers daily   Drug use: Yes    Types: Marijuana    Comment: once a week    Allergies  Allergen Reactions   Antihistamines, Loratadine-Type Hives    Any kind of antihistamines-hallucination, swelling up, itching, rash   Penicillins Other (See Comments)    Throat swelling up, hallucinations.   Shellfish Allergy Swelling    More as a kid, and now mild reaction-itchy eyes and nose   Iodine Nausea And Vomiting    Really nauseas and vomited.    Current Outpatient Medications  Medication Sig Dispense Refill   amLODipine (NORVASC) 5 MG tablet Take 1 tablet (5 mg total) by mouth daily. 90 tablet 3   Artificial Tear Ointment (DRY EYES OP) Place 1 drop into both eyes daily as needed (Dry eye).     aspirin EC 81 MG tablet Take  1 tablet (81 mg total) by mouth daily. 90 tablet 3   clopidogrel (PLAVIX) 75 MG tablet TAKE 1 TABLET (75 MG TOTAL) BY MOUTH DAILY. 90 tablet 3   ezetimibe (ZETIA) 10 MG tablet Take 1 tablet (10 mg total) by mouth daily. 90 tablet 3   fenofibrate (TRICOR) 145 MG tablet Take 1 tablet (145 mg total) by mouth daily. 90 tablet 3   metoprolol tartrate (LOPRESSOR) 25 MG tablet Take 1 tablet (25 mg total) by mouth 2 (two) times daily. 180 tablet 3   Multiple Vitamin (MULTIVITAMIN) tablet Take 1 tablet by mouth daily.     nitroGLYCERIN (NITROSTAT) 0.4 MG SL tablet Place 1 tablet (0.4 mg total) under the tongue every 5 (five) minutes as needed for  chest pain. 25 tablet 3   pantoprazole (PROTONIX) 20 MG tablet Take 1 tablet (20 mg total) by mouth daily. 90 tablet 3   rosuvastatin (CRESTOR) 40 MG tablet TAKE 1 TABLET BY MOUTH EVERY DAY 90 tablet 3   No current facility-administered medications for this visit.    Review of Systems  Constitutional: negative Eyes: negative Ears, nose, mouth, throat, and face: negative Respiratory: positive for dyspnea on exertion Cardiovascular: negative Gastrointestinal: negative Genitourinary:negative Integument/breast: negative Hematologic/lymphatic: negative Musculoskeletal:negative Neurological: negative Behavioral/Psych: negative Endocrine: negative Allergic/Immunologic: negative  Physical Exam  PYK:DXIPJ, healthy, no distress, well nourished, and well developed SKIN: skin color, texture, turgor are normal, no rashes or significant lesions HEAD: Normocephalic, No masses, lesions, tenderness or abnormalities EYES: normal, PERRLA, Conjunctiva are pink and non-injected EARS: External ears normal, Canals clear OROPHARYNX:no exudate, no erythema, and lips, buccal mucosa, and tongue normal  NECK: supple, no adenopathy, no JVD LYMPH:  no palpable lymphadenopathy, no hepatosplenomegaly LUNGS: clear to auscultation , and palpation HEART: regular rate & rhythm,  no murmurs, and no gallops ABDOMEN:abdomen soft, non-tender, normal bowel sounds, and no masses or organomegaly BACK: Back symmetric, no curvature., No CVA tenderness EXTREMITIES:no joint deformities, effusion, or inflammation, no edema  NEURO: alert & oriented x 3 with fluent speech, no focal motor/sensory deficits  PERFORMANCE STATUS: ECOG 1  LABORATORY DATA: Lab Results  Component Value Date   WBC 8.7 01/17/2021   HGB 12.0 (L) 01/17/2021   HCT 36.7 (L) 01/17/2021   MCV 91.1 01/17/2021   PLT 185 01/17/2021      Chemistry      Component Value Date/Time   NA 136 01/17/2021 0140   NA 141 12/21/2018 0739   K 4.0 01/17/2021 0140   CL 101 01/17/2021 0140   CO2 25 01/17/2021 0140   BUN 13 01/17/2021 0140   BUN 15 12/21/2018 0739   CREATININE 1.16 01/17/2021 0140      Component Value Date/Time   CALCIUM 8.0 (L) 01/17/2021 0140   ALKPHOS 31 (L) 01/14/2021 1017   AST 24 01/14/2021 1017   ALT 24 01/14/2021 1017   BILITOT 0.7 01/14/2021 1017   BILITOT 0.5 08/12/2020 8250       RADIOGRAPHIC STUDIES: DG Chest 2 View  Result Date: 02/26/2021 CLINICAL DATA:  History of right lung surgery EXAM: CHEST - 2 VIEW COMPARISON:  Previous studies including the examination of 01/17/2021 FINDINGS: Transverse diameter of heart is increased. There is interval removal of right chest tube. There is interval clearing of subcutaneous emphysema in the right chest wall. There is increased density in the medial right lower lung fields. Right lung is smaller than left. There are no signs of pulmonary edema. There is blunting of right lateral CP angle. Metallic sutures are seen in the sternum. There is no pneumothorax. IMPRESSION: There is increased density in the medial right mid and right lower lung fields suggesting pleural thickening from previous surgery or partial atelectasis. There are no signs of alveolar pulmonary edema. Blunting of right lateral costophrenic angle may be due to small effusion or  pleural thickening. Electronically Signed   By: Elmer Picker M.D.   On: 02/26/2021 13:48    ASSESSMENT: This is a very pleasant 65 years old white male recently diagnosed with a stage Ia (T1c, N0, M0) non-small cell lung cancer, moderately differentiated squamous cell carcinoma status post right lower lobectomy with lymph node sampling on January 16, 2021 with a tumor size of 2.0 cm.   PLAN: I had a  lengthy discussion with the patient today about his current disease stage, prognosis and treatment options. I explained to the patient that he had curable treatment for his condition with the surgical resection.  I also explained to the patient that there is no survival benefit for adjuvant systemic chemotherapy or radiation. The patient was informed about the 5-year survival with a stage Ia which in the range of 80%.. I recommended for the patient to continue on observation with repeat CT scan of the chest in 6 months for close monitoring of his disease. We will see him back for follow-up visit at that time. I also advised the patient to continue with the smoking cessation and try to cut on the vaping if possible. He was advised to call immediately if he has any concerning symptoms in the interval.  The patient voices understanding of current disease status and treatment options and is in agreement with the current care plan.  All questions were answered. The patient knows to call the clinic with any problems, questions or concerns. We can certainly see the patient much sooner if necessary.  Thank you so much for allowing me to participate in the care of [facility]. I will continue to follow up the patient with you and assist in his care.  The total time spent in the appointment was 60 minutes.  Disclaimer: This note was dictated with voice recognition software. Similar sounding words can inadvertently be transcribed and may not be corrected upon review.   Eilleen Kempf March 25, 2021, 2:04 PM

## 2021-03-28 ENCOUNTER — Telehealth: Payer: Self-pay | Admitting: Internal Medicine

## 2021-03-28 NOTE — Telephone Encounter (Signed)
Sch per 12/6 los, pt aware.

## 2021-04-08 ENCOUNTER — Ambulatory Visit (HOSPITAL_COMMUNITY): Payer: Medicare HMO | Attending: Nurse Practitioner

## 2021-04-08 ENCOUNTER — Other Ambulatory Visit: Payer: Self-pay

## 2021-04-08 DIAGNOSIS — I5020 Unspecified systolic (congestive) heart failure: Secondary | ICD-10-CM | POA: Insufficient documentation

## 2021-04-08 LAB — ECHOCARDIOGRAM COMPLETE
Area-P 1/2: 3.91 cm2
S' Lateral: 4.9 cm

## 2021-04-08 MED ORDER — PERFLUTREN LIPID MICROSPHERE
1.0000 mL | INTRAVENOUS | Status: AC | PRN
  Administered 2021-04-08: 2 mL via INTRAVENOUS

## 2021-04-10 ENCOUNTER — Telehealth: Payer: Self-pay | Admitting: *Deleted

## 2021-04-10 DIAGNOSIS — I5042 Chronic combined systolic (congestive) and diastolic (congestive) heart failure: Secondary | ICD-10-CM

## 2021-04-10 MED ORDER — ENTRESTO 24-26 MG PO TABS: 1.0000 | ORAL_TABLET | Freq: Two times a day (BID) | ORAL | 3 refills | Status: DC

## 2021-04-10 MED ORDER — METOPROLOL SUCCINATE ER 50 MG PO TB24: 50.0000 mg | ORAL_TABLET | Freq: Every day | ORAL | 3 refills | Status: DC

## 2021-04-10 NOTE — Telephone Encounter (Signed)
Pt returning phone call... please advise  

## 2021-04-10 NOTE — Telephone Encounter (Signed)
I spoke with patient and gave him information from Dr Irish Lack and Christen Bame, NP.  Patient agreeable to medication changes.  [facility] [address]  Patient aware first month of Entresto should be free.  Phone number for [facility] assistance provided to patient.  I asked him to contact our office and let us know if he is unable to afford Entresto and is applying for assistance program. Patient will come to office for lab work on 04/07/21 Appointment made with Christen Bame, NP for 05/15/11 at 10:30.

## 2021-04-10 NOTE — Telephone Encounter (Signed)
Left message to call office

## 2021-04-10 NOTE — Telephone Encounter (Signed)
Lab appointment is 12/29

## 2021-04-10 NOTE — Telephone Encounter (Signed)
-----   Message from Jettie Booze, MD sent at 04/09/2021  2:48 PM EST ----- I reviewed Michelle's note and echo result.   I agree that change to Toprol and Delene Loll would given him the best chance of improving heart function , so I do agree with the changes.  Please let us know if there is an issue obtaining the [facility] as there is no generic since it is newer, but we have programs to help if the cost is high.  Hope his recovery from lung cancer surgery is going well.

## 2021-04-10 NOTE — Telephone Encounter (Signed)
Brandon Life, NP  04/08/2021  2:34 PM EST     Please call patient to report. He has continued depressed pumping function 40-45% and impaired relaxation (grade I diastolic dysfunction). With this diagnosis of combined systolic and diastolic heart failure, there are medications that would wourk better than amlodipine to help improve heart function.  1. Switch metoprolol tartrate to metoprolol succinate 50 mg daily (it is the preferred metoprolol for heart failure) 2. Stop amlodipine 3. Start Entresto 24-26 twice daily 4. Get bmet in one week  5. See me back in 1 month (you can double book [name] schedule and put my name in notes)

## 2021-04-11 ENCOUNTER — Other Ambulatory Visit: Payer: Self-pay | Admitting: *Deleted

## 2021-04-11 ENCOUNTER — Telehealth: Payer: Self-pay | Admitting: Interventional Cardiology

## 2021-04-11 MED ORDER — ENTRESTO 24-26 MG PO TABS: 1.0000 | ORAL_TABLET | Freq: Two times a day (BID) | ORAL | 3 refills | Status: DC

## 2021-04-11 NOTE — Telephone Encounter (Signed)
°*  STAT* If patient is at the pharmacy, call can be transferred to refill team.   1. Which medications need to be refilled? (please list name of each medication and dose if known)  sacubitril-valsartan (ENTRESTO) 24-26 MG  2. Which pharmacy/location (including street and city if local pharmacy) is medication to be sent to? [id number] [place] [place] - [address]  3. Do they need a 30 day or 90 day supply?   30 day supply  [facility] did not [facility] Rx, but patient showed up to pick it u this morning. Can it be resubmitted?

## 2021-04-11 NOTE — Addendum Note (Signed)
Addended by: Ulice Brilliant T on: 04/11/2021 01:44 PM   Modules accepted: Orders

## 2021-04-11 NOTE — Telephone Encounter (Signed)
error 

## 2021-04-11 NOTE — Telephone Encounter (Addendum)
Rx(s) sent to pharmacy electronically..  Attempted to send the rx in two times but it would not go through.   Called pharmacy and left rx on the voicemail.   Patient states he was supposed to have labs on the 29th, he wanted to know if he starts his meds on the 24th when should he have his labs. I advised patient to have his labs on the 30th. I informed patient that I would advise Pat (Dr Hassell Done Nurse) and if she had any changes then he would he back from our office. He voiced understanding.

## 2021-04-15 ENCOUNTER — Telehealth: Payer: Self-pay

## 2021-04-15 NOTE — Progress Notes (Signed)
° ° °  Chronic Care Management Pharmacy Assistant   Name: Brandon [name]  MRN: [id number] DOB: March 19, 1956  Reason for Encounter: CCM (Hypertension Disease State)   Recent office visits:  None since last CCM contact  Recent consult visits:   04/10/2021 - Leodis Liverpool, RN - Oncology - Telephone - Start: metoprolol succinate (TOPROL-XL) 50 MG 24 hr tablet - Take 1 tablet (50 mg total) by mouth daily. Take with or immediately following a meal. Start: sacubitril-valsartan (ENTRESTO) 24-26 MG - Take 1 tablet by mouth 2 (two) times daily. Stop: amLODipine (NORVASC) 5 MG tablet. Stop: metoprolol tartrate (LOPRESSOR) 25 MG tablet. No other information.  03/25/2021 - Brandon Bears, MD - Oncology - Patient presented for CBC with Differential, CMP and CT Chest w Contrast. No medication changes.   Hospital visits:  None since last CCM contact  Medications: Outpatient Encounter Medications as of 04/15/2021  Medication Sig   Artificial Tear Ointment (DRY EYES OP) Place 1 drop into both eyes daily as needed (Dry eye).   aspirin EC 81 MG tablet Take 1 tablet (81 mg total) by mouth daily.   clopidogrel (PLAVIX) 75 MG tablet TAKE 1 TABLET (75 MG TOTAL) BY MOUTH DAILY.   ezetimibe (ZETIA) 10 MG tablet Take 1 tablet (10 mg total) by mouth daily.   fenofibrate (TRICOR) 145 MG tablet Take 1 tablet (145 mg total) by mouth daily.   metoprolol succinate (TOPROL-XL) 50 MG 24 hr tablet Take 1 tablet (50 mg total) by mouth daily. Take with or immediately following a meal.   Multiple Vitamin (MULTIVITAMIN) tablet Take 1 tablet by mouth daily.   nitroGLYCERIN (NITROSTAT) 0.4 MG SL tablet Place 1 tablet (0.4 mg total) under the tongue every 5 (five) minutes as needed for chest pain.   pantoprazole (PROTONIX) 20 MG tablet Take 1 tablet (20 mg total) by mouth daily.   rosuvastatin (CRESTOR) 40 MG tablet TAKE 1 TABLET BY MOUTH EVERY DAY   sacubitril-valsartan (ENTRESTO) 24-26 MG Take 1 tablet by mouth 2 (two) times  daily.   No facility-administered encounter medications on file as of 04/15/2021.   Recent Office Vitals: BP Readings from Last 3 Encounters:  03/25/21 130/75  03/05/21 110/60  02/26/21 120/77   Pulse Readings from Last 3 Encounters:  03/25/21 70  03/05/21 (!) 56  02/26/21 60    Wt Readings from Last 3 Encounters:  03/25/21 205 lb 14.4 oz (93.4 kg)  03/05/21 203 lb (92.1 kg)  02/26/21 204 lb (92.5 kg)    Kidney Function Lab Results  Component Value Date/Time   CREATININE 1.15 03/25/2021 01:54 PM   CREATININE 1.16 01/17/2021 01:40 AM   CREATININE 0.95 01/14/2021 10:17 AM   GFRNONAA >60 03/25/2021 01:54 PM   GFRAA 103 12/21/2018 07:39 AM   BMP Latest Ref Rng & Units 03/25/2021 01/17/2021 01/14/2021  Glucose 70 - 99 mg/dL 97 193(H) 116(H)  BUN 8 - 23 mg/dL 18 13 12   Creatinine 0.61 - 1.24 mg/dL 1.15 1.16 0.95  BUN/Creat Ratio 10 - 24 - - -  Sodium 135 - 145 mmol/L 141 136 137  Potassium 3.5 - 5.1 mmol/L 4.3 4.0 4.0  Chloride 98 - 111 mmol/L 105 101 105  CO2 22 - 32 mmol/L 25 25 24   Calcium 8.9 - 10.3 mg/dL 9.1 8.0(L) 8.6(L)   Contacted patient on 04/15/2021. Patient prefers to opt out of CCM program.  Brandon [name], CPP notified  Brandon [name], [place] [phone]  Time Spent:  23 Minutes

## 2021-04-17 ENCOUNTER — Other Ambulatory Visit: Payer: Medicare HMO

## 2021-04-18 ENCOUNTER — Other Ambulatory Visit: Payer: Medicare HMO | Admitting: *Deleted

## 2021-04-18 ENCOUNTER — Other Ambulatory Visit: Payer: Self-pay

## 2021-04-18 DIAGNOSIS — I5042 Chronic combined systolic (congestive) and diastolic (congestive) heart failure: Secondary | ICD-10-CM

## 2021-04-18 LAB — BASIC METABOLIC PANEL
BUN/Creatinine Ratio: 13 (ref 10–24)
BUN: 14 mg/dL (ref 8–27)
CO2: 27 mmol/L (ref 20–29)
Calcium: 10 mg/dL (ref 8.6–10.2)
Chloride: 98 mmol/L (ref 96–106)
Creatinine, Ser: 1.11 mg/dL (ref 0.76–1.27)
Glucose: 146 mg/dL — ABNORMAL HIGH (ref 70–99)
Potassium: 4.3 mmol/L (ref 3.5–5.2)
Sodium: 139 mmol/L (ref 134–144)
eGFR: 73 mL/min/{1.73_m2} (ref >59)

## 2021-04-30 ENCOUNTER — Telehealth: Payer: Self-pay

## 2021-04-30 NOTE — Telephone Encounter (Signed)
**Note De-Identified Tomica Arseneault Obfuscation** The pts competed [employer] pt asst application for Delene Loll was left at the office with financial document.  I have completed the providers page of his application and have e-mailed it to Dr Hassell Done nurse so she can print a prescription for Entresto 24-26 mg #180 with 3 refills, have Dr Irish Lack sign and date it and the application, and to fax all to [employer] at the fax number written on the cover letter included or to place in Medical Records to be faxed.

## 2021-05-01 ENCOUNTER — Telehealth: Payer: Medicare HMO

## 2021-05-02 MED ORDER — ENTRESTO 24-26 MG PO TABS: 1.0000 | ORAL_TABLET | Freq: Two times a day (BID) | ORAL | 3 refills | Status: DC

## 2021-05-02 NOTE — Telephone Encounter (Signed)
Completed application and prescription faxed

## 2021-05-06 NOTE — Telephone Encounter (Signed)
**Note De-Identified Brandon [name] Obfuscation** Letter received from [facility] stating that they approved the pt for asst with Entresto until 05/02/2022. Pt ID: [id number]  The letter states that they have notified the pt of this approval as well.

## 2021-05-14 ENCOUNTER — Other Ambulatory Visit: Payer: Self-pay

## 2021-05-14 ENCOUNTER — Encounter: Payer: Self-pay | Admitting: Nurse Practitioner

## 2021-05-14 ENCOUNTER — Ambulatory Visit: Payer: Medicare HMO | Admitting: Nurse Practitioner

## 2021-05-14 VITALS — BP 130/70 | HR 72 | Ht 68.0 in | Wt 214.8 lb

## 2021-05-14 DIAGNOSIS — I5020 Unspecified systolic (congestive) heart failure: Secondary | ICD-10-CM

## 2021-05-14 DIAGNOSIS — E782 Mixed hyperlipidemia: Secondary | ICD-10-CM

## 2021-05-14 DIAGNOSIS — I5042 Chronic combined systolic (congestive) and diastolic (congestive) heart failure: Secondary | ICD-10-CM | POA: Diagnosis not present

## 2021-05-14 DIAGNOSIS — I10 Essential (primary) hypertension: Secondary | ICD-10-CM

## 2021-05-14 DIAGNOSIS — I2510 Atherosclerotic heart disease of native coronary artery without angina pectoris: Secondary | ICD-10-CM

## 2021-05-14 LAB — BASIC METABOLIC PANEL
BUN/Creatinine Ratio: 15 (ref 10–24)
BUN: 18 mg/dL (ref 8–27)
CO2: 29 mmol/L (ref 20–29)
Calcium: 9.8 mg/dL (ref 8.6–10.2)
Chloride: 100 mmol/L (ref 96–106)
Creatinine, Ser: 1.19 mg/dL (ref 0.76–1.27)
Glucose: 123 mg/dL — ABNORMAL HIGH (ref 70–99)
Potassium: 5.4 mmol/L — ABNORMAL HIGH (ref 3.5–5.2)
Sodium: 140 mmol/L (ref 134–144)
eGFR: 67 mL/min/{1.73_m2} (ref >59)

## 2021-05-14 NOTE — Patient Instructions (Signed)
Medication Instructions:   Your physician recommends that you continue on your current medications as directed. Please refer to the Current Medication list given to you today.  *If you need a refill on your cardiac medications before your next appointment, please call your pharmacy*   Lab Work:  TODAY!!!!!  BMET  If you have labs (blood work) drawn today and your tests are completely normal, you will receive your results only by: [place] (if you have MyChart) OR A paper copy in the mail If you have any lab test that is abnormal or we need to change your treatment, we will call you to review the results.   Testing/Procedures:  None ordered.   Follow-Up: At [facility], you and your health needs are our priority.  As part of our continuing mission to provide you with exceptional heart care, we have created designated Provider Care Teams.  These Care Teams include your primary Cardiologist (physician) and Advanced Practice Providers (APPs -  Physician Assistants and Nurse Practitioners) who all work together to provide you with the care you need, when you need it.  We recommend signing up for the patient portal called "MyChart".  Sign up information is provided on this After Visit Summary.  MyChart is used to connect with patients for Virtual Visits (Telemedicine).  Patients are able to view lab/test results, encounter notes, upcoming appointments, etc.  Non-urgent messages can be sent to your provider as well.   To learn more about what you can do with MyChart, go to [email or web address].    Your next appointment:   3 month(s)  The format for your next appointment:   In Person  Provider:   Larae Grooms, MD

## 2021-05-14 NOTE — Progress Notes (Signed)
Cardiology Office Note    Date:  05/14/2021   ID:  Brandon [name], DOB Feb 18, 1956, MRN [id number]  PCP:  Brandon Noe, MD  Cardiologist:  Brandon Grooms, MD  Electrophysiologist:  None   Chief Complaint: f/u CAD, chronic HFrEF  History of Present Illness:   Brandon [name] is a 66 y.o. male with history of HTN, CAD s/p CABG, HFrEF, hypertriglyceridemia, hyperlipidemia, and lung cancer.   In 2000, patient had MI and subsequently had CABG x 3 in 2000 in [place], [place]. @ [facility]. LIMA to LAD, radial graft to posterolateral OM, radial graft to RCA. Angina was described as back pain that then went to left arm. In 2005, had angina that felt like intermittent back pain ?stent of RCA unclear as to placement in graft or native vessel @ [place].  2015 [facility] cardiac cath patent grafts, patent stent in RCA,  medical management. LVEF by ventriculogram 40%. He reports he had additional stenting in the 2014-2016 time frame at [facility] in [place], [place]. 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 Kipp [name] on 01/16/21. He reports he has been d/c'ed by surgery and feels well. Gets winded when climbing steps but no problems with daily activities and able to fish for several hours without symptoms. He does not monitor BP at home but reports it has never been elevated, even when he was having MI in 2000. He reports that he was put on amlodipine for prevention. His PCP recently put him on omeprazole for a pain that he would get in the top of his stomach, mid-sternum and since starting that medication he has had no further pain in this area. He thinks that he was mistaking this discomfort for pain from his heart in the past. He is active daily doing work around the house. Prior to lobectomy he was walking 3-4 miles per day. He has not resumed his walking  but it is his goal to resume soon and does not wish to pursue cardiopulmonary rehab. He did not require chemo or radiation for lung cancer.   He was last seen in our office by me on 03/05/21. Repeat echo was ordered for evaluation of dyspnea and piror history of low LVEF in 2000 with no recent echo for comparison.   Today, he is here alone for follow-up of chronic CHF. Following the results of his echo on 04/08/21 which revealed decreased LVEF at 40-45% and G1DD, he was advised to switch metoprolol tartrate to metoprolol succinate 50 mg, stop amlodipine, and start Entresto 24-26 mg twice daily. Basic metabolic panel on 46/96 revealed stable kidney function and electrolytes. He is followed closely by oncology for lung cancer with next appointment scheduled for June. He quit smoking 1 year ago but continues to vape 2% nicotine. He was able to get patient assistance to help with the cost of Entresto.    Past Medical History:  Diagnosis Date   Arthritis    Basal cell carcinoma    CAD (coronary artery disease) 06/14/2018   Cath on 01/23/2014 with LAD occlusion   Essential hypertension 06/08/2018   Gastroesophageal reflux disease 06/08/2018   GERD (gastroesophageal reflux disease)    Heart disease    Heart failure with reduced ejection fraction ([place]) 06/14/2018   ECHO 01/22/2014 with EF 40-45%   History of chicken pox  History of migraine    Hx of basal cell carcinoma 06/08/2018   04/2017. Left jaw   Hx of CABG    Hyperlipidemia    Inguinal hernia    Mixed hyperlipidemia 06/08/2018   Tobacco use disorder 06/08/2018    Past Surgical History:  Procedure Laterality Date   APPENDECTOMY  1960   BRONCHIAL BIOPSY  01/16/2021   Procedure: BRONCHIAL BIOPSIES;  Surgeon: Garner Nash, DO;  Location: [facility];  Service: Pulmonary;;   BRONCHIAL BRUSHINGS  01/16/2021   Procedure: BRONCHIAL BRUSHINGS;  Surgeon: Garner Nash, DO;  Location: [facility];  Service: Pulmonary;;   CORONARY  ARTERY BYPASS GRAFT     x 3   FIDUCIAL MARKER PLACEMENT  01/16/2021   Procedure: FIDUCIAL DYE MARKING;  Surgeon: Garner Nash, DO;  Location: [facility];  Service: Pulmonary;;   INGUINAL HERNIA REPAIR  2008   SKIN CANCER EXCISION     Stent placed     in 2005 and in 2014   [place] N/A 01/16/2021   Procedure: [place];  Surgeon: Garner Nash, DO;  Location: [facility];  Service: Pulmonary;  Laterality: N/A;  ION, ICG+[place] Dye Marking   VIDEO BRONCHOSCOPY WITH RADIAL ENDOBRONCHIAL ULTRASOUND  01/16/2021   Procedure: VIDEO BRONCHOSCOPY WITH RADIAL ENDOBRONCHIAL ULTRASOUND;  Surgeon: Garner Nash, DO;  Location: [facility];  Service: Pulmonary;;    Current Medications: Current Meds  Medication Sig   Artificial Tear Ointment (DRY EYES OP) Place 1 drop into both eyes daily as needed (Dry eye).   aspirin EC 81 MG tablet Take 1 tablet (81 mg total) by mouth daily.   clopidogrel (PLAVIX) 75 MG tablet TAKE 1 TABLET (75 MG TOTAL) BY MOUTH DAILY.   ezetimibe (ZETIA) 10 MG tablet Take 1 tablet (10 mg total) by mouth daily.   fenofibrate (TRICOR) 145 MG tablet Take 1 tablet (145 mg total) by mouth daily.   metoprolol succinate (TOPROL-XL) 50 MG 24 hr tablet Take 1 tablet (50 mg total) by mouth daily. Take with or immediately following a meal.   Multiple Vitamin (MULTIVITAMIN) tablet Take 1 tablet by mouth daily.   nitroGLYCERIN (NITROSTAT) 0.4 MG SL tablet Place 1 tablet (0.4 mg total) under the tongue every 5 (five) minutes as needed for chest pain.   pantoprazole (PROTONIX) 20 MG tablet Take 1 tablet (20 mg total) by mouth daily.   rosuvastatin (CRESTOR) 40 MG tablet TAKE 1 TABLET BY MOUTH EVERY DAY   sacubitril-valsartan (ENTRESTO) 24-26 MG Take 1 tablet by mouth 2 (two) times daily.    Allergies:   Antihistamines, loratadine-type; Penicillins; Shellfish allergy; and Iodine   Social History    Socioeconomic History   Marital status: Divorced    Spouse name: Not on file   Number of children: 0   Years of education: Not on file   Highest education level: Not on file  Occupational History   Not on file  Tobacco Use   Smoking status: Former    Packs/day: 1.50    Years: 40.00    Pack years: 60.00    Types: Cigarettes    Start date: 11    Quit date: 04/21/2019    Years since quitting: 2.0   Smokeless tobacco: Former    Types: Chew    Quit date: 04/21/2003  Vaping Use   Vaping Use: Every day   Substances: Nicotine  Substance and Sexual Activity   Alcohol use: Yes    Comment:  2 to 3 beers daily   Drug use: Yes    Types: Marijuana    Comment: once a week   Sexual activity: Yes    Birth control/protection: Post-menopausal  Other Topics Concern   Not on file  Social History Narrative   06/15/19   [place] moved to help care for his mother   Work: [employer]      Family: Mother still living, sister is nearby - Jenny Reichmann      Enjoys: fishing      Exercise: not currently   Diet: eating more than he should, trying to reduce sweets      Safety   Seat belts: Yes    Guns: Yes    Safe in relationships: Yes    Social Determinants of Radio broadcast assistant Strain: Not on file  Food Insecurity: Not on file  Transportation Needs: Not on file  Physical Activity: Not on file  Stress: Not on file  Social Connections: Not on file     Family History:  The patient's family history includes Colon cancer (age of onset: 56) in his father; Colon polyps in his sister; Dementia in his mother; Diabetes in his father; Heart attack in his maternal grandfather and maternal grandmother; Heart attack (age of onset: 61) in his mother; Heart attack (age of onset: 96) in his brother; Heart disease in his mother; Prostate cancer in his paternal grandfather; Stroke (age of onset: 29) in his paternal grandmother.  ROS:   Please see the history of  present illness. All other systems are reviewed and otherwise negative.    EKGs/Labs/Other Studies Reviewed:    Studies reviewed are outlined and summarized above. Reports included below if pertinent.  Echo 04/08/21  Left Ventricle: Left ventricular ejection fraction, by estimation, is 40  to 45%. The left ventricle has mildly decreased function. The left  ventricle demonstrates global hypokinesis. Definity contrast agent was  given IV to delineate the left ventricular  endocardial borders. The left ventricular internal cavity size was mildly dilated. There is no left ventricular hypertrophy. Left ventricular diastolic parameters are consistent with Grade I diastolic dysfunction (impaired relaxation).  LV Wall Scoring:  The basal inferolateral segment, apical lateral segment, and mid  anterolateral segment are akinetic.  Right Ventricle: The right ventricular size is mildly enlarged. No  increase in right ventricular wall thickness. Right ventricular systolic  function is normal.  Left Atrium: Left atrial size was severely dilated.  Right Atrium: Right atrial size was normal in size.  Pericardium: There is no evidence of pericardial effusion.  Mitral Valve: The mitral valve is normal in structure. No evidence of  mitral valve regurgitation. No evidence of mitral valve stenosis.  Tricuspid Valve: The tricuspid valve is normal in structure. Tricuspid  valve regurgitation is mild . No evidence of tricuspid stenosis.  Aortic Valve: The aortic valve is normal in structure. Aortic valve  regurgitation is not visualized. No aortic stenosis is present.  Pulmonic Valve: The pulmonic valve was normal in structure. Pulmonic valve  regurgitation is not visualized. No evidence of pulmonic stenosis.  Aorta: The aortic root is normal in size and structure.  Venous: The inferior vena cava is normal in size with greater than 50%  respiratory variability, suggesting right atrial pressure of 3 mmHg.   IAS/Shunts: No atrial level shunt detected by color flow Doppler.   ECHO 2000  Inferior posterior hypokinesis with mildly depressed systolic function. Estimated ejection fraction 45%. Mild  concentric hypertrophy. Inferior posterior hypokinesis with mildly depressed systolic function. No pericardial effusion.     EKG:  EKG is not ordered today  Recent Labs: 03/25/2021: ALT 20; Hemoglobin 13.9; Platelet Count 241 04/18/2021: BUN 14; Creatinine, Ser 1.11; Potassium 4.3; Sodium 139   Recent Lipid Panel    Component Value Date/Time   CHOL 132 03/12/2021 0805   TRIG 145 03/12/2021 0805   HDL 40 03/12/2021 0805   CHOLHDL 3.3 03/12/2021 0805   LDLCALC 67 03/12/2021 0805    PHYSICAL EXAM:    VS:  BP 130/70 (BP Location: Left Arm, Patient Position: Sitting, Cuff Size: Normal)    Pulse 72    Ht 5\' 8"  (1.727 m)    Wt 214 lb 12.8 oz (97.4 kg)    SpO2 93%    BMI 32.66 kg/m   BMI: Body mass index is 32.66 kg/m.  GEN: Well nourished, well developed male in no acute distress HEENT: normocephalic, atraumatic Neck: no JVD, carotid bruits, or masses Cardiac: RRR; no murmurs, rubs, or gallops, no edema  Respiratory:  clear to auscultation bilaterally, normal work of breathing GI: soft, nontender, nondistended, + BS MS: no deformity or atrophy Skin: warm and dry, no rash Neuro:  Alert and Oriented x 3, Strength and sensation are intact, follows commands Psych: euthymic mood, full affect  Wt Readings from Last 3 Encounters:  05/14/21 214 lb 12.8 oz (97.4 kg)  03/25/21 205 lb 14.4 oz (93.4 kg)  03/05/21 203 lb (92.1 kg)     ASSESSMENT & PLAN:   CAD s/p CABG x 3: He denies chest discomfort or pain consistent with past MI. No indication for further ischemia evaluation at this time. He admits to being less active since lung surgery September 2022. Encouraged him to increase moderate physical activity to 150 minutes per week. Continue Plavix, aspirin, statin, metoprolol.    Chronic HFrEF: LVEF  40-45%, G1DD by echo 12/22. Records reveal ventriculogram from cardiac cath in 2015 with LVEF 40%. He was started on Entresto and metoprolol succinate (change from metoprolol tartrate) for GDMT for heart failure. He is tolerating these medications without problem. He has not been very active over the past few months but has started walking for exercise on a few occasions recently. He appears euvolemic on exam today. No edema, PND, orthopnea. Dyspnea is stable since lobectomy 9/22. Will get basic metabolic panel today and if kidney function and electrolytes are stable, we will increase Entresto to 49-51 mg twice daily. I gave him specific instructions on taking 2 of his 24-26 mg tablets until he runs out. He is aware to wait for our call following lab work. Encouraged low sodium diet and increase moderate physical activity to 150 minutes per week. Continue Toprol XL.   Hypertension: BP is stable today. He does not check it at home but reports it has never been elevated. Continue Entresto, Toprol XL.   Hyperlipidemia LDL goal < 70: LDL 67 03/12/21. His triglycerides improved from 223 to 145. Admits to late night snacking. Encouraged him to increase daily caloric intake earlier in the day rather than eating more at night. Encouraged low sodium, heart healthy diet with focus on protein and decrease in sugar. Continue statin, ezetimibe.   Lung Cancer: s/p lobectomy 9/22. Management per oncology.   Disposition: F/u 3 months with Dr. Irish Lack   Medication Adjustments/Labs and Tests Ordered: Current medicines are reviewed at length with the patient today.  Concerns regarding medicines are outlined above. Medication changes, Labs and Tests  ordered today are summarized above and listed in the Patient Instructions accessible in Encounters.   Signed, Emmaline Life, NP  05/14/2021 11:11 AM    [address], [place], [place]  [address] Phone: [phone]; Fax: [phone]

## 2021-05-15 ENCOUNTER — Telehealth: Payer: Self-pay | Admitting: *Deleted

## 2021-05-15 DIAGNOSIS — E875 Hyperkalemia: Secondary | ICD-10-CM

## 2021-05-15 NOTE — Telephone Encounter (Signed)
-----   Message from Emmaline Life, NP sent at 05/15/2021  8:52 AM EST ----- Please notify patient that due to his elevated potassium, we will not increase the dose of Entresto at this time. He should continue Entresto 24-26 twice daily. Reduce intake of high potassium foods and try to consume mostly water for his total daily fluid intake of 64 oz. Repeat bmet in 7 days.

## 2021-05-15 NOTE — Telephone Encounter (Signed)
Spoke with pt and reviewed results and recommendations per Christen Bame, NP.  Pt will come for labs 05/22/21.  Pt verbalized understanding and was in agreement with plan.

## 2021-05-16 ENCOUNTER — Encounter: Payer: Medicare HMO | Admitting: Primary Care

## 2021-05-16 ENCOUNTER — Other Ambulatory Visit: Payer: Self-pay

## 2021-05-19 NOTE — Progress Notes (Signed)
Patient never seen

## 2021-05-22 ENCOUNTER — Other Ambulatory Visit: Payer: Medicare HMO

## 2021-05-22 ENCOUNTER — Ambulatory Visit (HOSPITAL_COMMUNITY)
Admission: RE | Admit: 2021-05-22 | Discharge: 2021-05-22 | Disposition: A | Discharge status: Home / Self Care | Payer: Medicare HMO | Source: Ambulatory Visit | Attending: Family Medicine | Admitting: Family Medicine

## 2021-05-22 ENCOUNTER — Other Ambulatory Visit: Payer: Self-pay

## 2021-05-22 DIAGNOSIS — E875 Hyperkalemia: Secondary | ICD-10-CM | POA: Diagnosis not present

## 2021-05-22 DIAGNOSIS — E041 Nontoxic single thyroid nodule: Secondary | ICD-10-CM | POA: Insufficient documentation

## 2021-05-22 LAB — BASIC METABOLIC PANEL
BUN/Creatinine Ratio: 13 (ref 10–24)
BUN: 15 mg/dL (ref 8–27)
CO2: 25 mmol/L (ref 20–29)
Calcium: 9.8 mg/dL (ref 8.6–10.2)
Chloride: 102 mmol/L (ref 96–106)
Creatinine, Ser: 1.14 mg/dL (ref 0.76–1.27)
Glucose: 114 mg/dL — ABNORMAL HIGH (ref 70–99)
Potassium: 5.1 mmol/L (ref 3.5–5.2)
Sodium: 139 mmol/L (ref 134–144)
eGFR: 70 mL/min/{1.73_m2} (ref >59)

## 2021-05-23 ENCOUNTER — Telehealth: Payer: Self-pay

## 2021-05-23 MED ORDER — ENTRESTO 49-51 MG PO TABS: 1.0000 | ORAL_TABLET | Freq: Two times a day (BID) | ORAL | 3 refills | Status: DC

## 2021-05-23 NOTE — Telephone Encounter (Signed)
-----   Message from Emmaline Life, NP sent at 05/23/2021 12:53 PM EST ----- His potassium level has improved and kidney function is stable. I would like to increase his Entresto to 49-51 mg twice daily as we discussed at his recent office visit. Need repeat bmet once he is on that dose for 7-10 days. He was going to take 2 of his 24-26 tablets until he runs out. He needs a new Rx for the 49-51 mg dose.

## 2021-05-23 NOTE — Telephone Encounter (Signed)
**Note De-Identified Loreley Schwall Obfuscation** Entresto 49-51 mg RX for #180 with 3 refills e-scribed to [employer] Emergency planning/management officer for [employer] pt [employer]) to fill.

## 2021-05-23 NOTE — Addendum Note (Signed)
**Note De-Identified Jimeka Balan Obfuscation** Addended by: Dennie Fetters on: 05/23/2021 01:49 PM   Modules accepted: Orders

## 2021-05-23 NOTE — Telephone Encounter (Signed)
Called patient and discussed lab results. Also discussed Christen Bame, NP's order to increase Entresto to 49-51mg  PO twice daily. Per recommendation at last office visit, patient has been taking 2 tabs of Entresto 24-26mg  BID. Patient verbalized understanding of the above.  Patient states he is receiving assistance with cost of Entresto medication and that it's his understanding the manufacturer is to directly mail him his new prescription. Will forward to Mardene Celeste Via to advise on obtaining new prescription for patient.

## 2021-05-26 ENCOUNTER — Other Ambulatory Visit: Payer: Self-pay | Admitting: Primary Care

## 2021-05-26 DIAGNOSIS — E041 Nontoxic single thyroid nodule: Secondary | ICD-10-CM

## 2021-05-27 ENCOUNTER — Other Ambulatory Visit: Payer: Self-pay

## 2021-05-27 MED ORDER — ENTRESTO 49-51 MG PO TABS: 1.0000 | ORAL_TABLET | Freq: Two times a day (BID) | ORAL | 3 refills | Status: DC

## 2021-05-27 NOTE — Telephone Encounter (Signed)
Patient is following up, requesting an update on his patient assistance through [employer]. He would also like to confirm the correct dosage has been sent in as it was recently updated. Please advise as able.

## 2021-05-27 NOTE — Telephone Encounter (Signed)
**Note De-Identified Reedy Biernat Obfuscation** The pt is aware that I have e-scribed his increased 49-51 mg Entresto RX to [place] [place] (pharmacy [facility] [employer] [facility] in [place]) again today to fill $180 with 3 refills.  He is aware to contact [employer] to f/u on his increased Entresto prescription shipment.  He thanked me for calling him back and states that he is contacting [employer] [facility] [place] today.

## 2021-06-18 ENCOUNTER — Ambulatory Visit
Admission: RE | Admit: 2021-06-18 | Discharge: 2021-06-18 | Disposition: A | Discharge status: Home / Self Care | Payer: Medicare HMO | Source: Ambulatory Visit | Attending: Primary Care | Admitting: Primary Care

## 2021-06-18 ENCOUNTER — Other Ambulatory Visit (HOSPITAL_COMMUNITY)
Admission: RE | Admit: 2021-06-18 | Discharge: 2021-06-18 | Disposition: A | Discharge status: Home / Self Care | Payer: Medicare HMO | Source: Ambulatory Visit | Attending: Physician Assistant | Admitting: Physician Assistant

## 2021-06-18 DIAGNOSIS — E041 Nontoxic single thyroid nodule: Secondary | ICD-10-CM | POA: Insufficient documentation

## 2021-06-19 LAB — CYTOLOGY - NON PAP

## 2021-08-13 ENCOUNTER — Other Ambulatory Visit: Payer: Medicare HMO

## 2021-08-13 ENCOUNTER — Ambulatory Visit: Payer: Medicare HMO | Admitting: Interventional Cardiology

## 2021-08-20 ENCOUNTER — Ambulatory Visit: Payer: Medicare HMO | Admitting: Interventional Cardiology

## 2021-08-29 ENCOUNTER — Other Ambulatory Visit: Payer: Medicare HMO

## 2021-09-08 ENCOUNTER — Encounter: Payer: Self-pay | Admitting: Gastroenterology

## 2021-09-18 ENCOUNTER — Other Ambulatory Visit: Payer: Self-pay | Admitting: Internal Medicine

## 2021-09-18 DIAGNOSIS — C3490 Malignant neoplasm of unspecified part of unspecified bronchus or lung: Secondary | ICD-10-CM

## 2021-09-19 ENCOUNTER — Other Ambulatory Visit: Payer: Medicare HMO

## 2021-09-19 ENCOUNTER — Inpatient Hospital Stay: Payer: Medicare HMO | Attending: Internal Medicine

## 2021-09-19 ENCOUNTER — Ambulatory Visit (HOSPITAL_COMMUNITY)
Admission: RE | Admit: 2021-09-19 | Discharge: 2021-09-19 | Disposition: A | Discharge status: Home / Self Care | Payer: Medicare HMO | Source: Ambulatory Visit | Attending: Internal Medicine | Admitting: Internal Medicine

## 2021-09-19 ENCOUNTER — Other Ambulatory Visit: Payer: Self-pay

## 2021-09-19 DIAGNOSIS — C3490 Malignant neoplasm of unspecified part of unspecified bronchus or lung: Secondary | ICD-10-CM | POA: Insufficient documentation

## 2021-09-19 DIAGNOSIS — I700 Atherosclerosis of aorta: Secondary | ICD-10-CM | POA: Insufficient documentation

## 2021-09-19 DIAGNOSIS — R911 Solitary pulmonary nodule: Secondary | ICD-10-CM | POA: Diagnosis not present

## 2021-09-19 DIAGNOSIS — J984 Other disorders of lung: Secondary | ICD-10-CM | POA: Insufficient documentation

## 2021-09-19 DIAGNOSIS — Z880 Allergy status to penicillin: Secondary | ICD-10-CM | POA: Insufficient documentation

## 2021-09-19 DIAGNOSIS — E782 Mixed hyperlipidemia: Secondary | ICD-10-CM | POA: Insufficient documentation

## 2021-09-19 DIAGNOSIS — Z87891 Personal history of nicotine dependence: Secondary | ICD-10-CM | POA: Insufficient documentation

## 2021-09-19 DIAGNOSIS — K7689 Other specified diseases of liver: Secondary | ICD-10-CM | POA: Insufficient documentation

## 2021-09-19 DIAGNOSIS — C3431 Malignant neoplasm of lower lobe, right bronchus or lung: Secondary | ICD-10-CM | POA: Insufficient documentation

## 2021-09-19 DIAGNOSIS — Z79899 Other long term (current) drug therapy: Secondary | ICD-10-CM | POA: Insufficient documentation

## 2021-09-19 DIAGNOSIS — Z85828 Personal history of other malignant neoplasm of skin: Secondary | ICD-10-CM | POA: Insufficient documentation

## 2021-09-19 DIAGNOSIS — K219 Gastro-esophageal reflux disease without esophagitis: Secondary | ICD-10-CM | POA: Insufficient documentation

## 2021-09-19 DIAGNOSIS — I2510 Atherosclerotic heart disease of native coronary artery without angina pectoris: Secondary | ICD-10-CM | POA: Insufficient documentation

## 2021-09-19 DIAGNOSIS — Z9049 Acquired absence of other specified parts of digestive tract: Secondary | ICD-10-CM | POA: Insufficient documentation

## 2021-09-19 DIAGNOSIS — Z888 Allergy status to other drugs, medicaments and biological substances status: Secondary | ICD-10-CM | POA: Insufficient documentation

## 2021-09-19 LAB — CMP (CANCER CENTER ONLY)
ALT: 18 U/L (ref 0–44)
AST: 20 U/L (ref 15–41)
Albumin: 4.3 g/dL (ref 3.5–5.0)
Alkaline Phosphatase: 33 U/L — ABNORMAL LOW (ref 38–126)
Anion gap: 9 (ref 5–15)
BUN: 18 mg/dL (ref 8–23)
CO2: 27 mmol/L (ref 22–32)
Calcium: 9.3 mg/dL (ref 8.9–10.3)
Chloride: 105 mmol/L (ref 98–111)
Creatinine: 1.33 mg/dL — ABNORMAL HIGH (ref 0.61–1.24)
GFR, Estimated: 58 mL/min — ABNORMAL LOW (ref >60)
Glucose, Bld: 91 mg/dL (ref 70–99)
Potassium: 4.4 mmol/L (ref 3.5–5.1)
Sodium: 141 mmol/L (ref 135–145)
Total Bilirubin: 0.6 mg/dL (ref 0.3–1.2)
Total Protein: 6.9 g/dL (ref 6.5–8.1)

## 2021-09-19 LAB — CBC WITH DIFFERENTIAL (CANCER CENTER ONLY)
Abs Immature Granulocytes: 0.01 10*3/uL (ref 0.00–0.07)
Basophils Absolute: 0.1 10*3/uL (ref 0.0–0.1)
Basophils Relative: 1 %
Eosinophils Absolute: 0.2 10*3/uL (ref 0.0–0.5)
Eosinophils Relative: 2 %
HCT: 43 % (ref 39.0–52.0)
Hemoglobin: 14.4 g/dL (ref 13.0–17.0)
Immature Granulocytes: 0 %
Lymphocytes Relative: 30 %
Lymphs Abs: 2.3 10*3/uL (ref 0.7–4.0)
MCH: 30.6 pg (ref 26.0–34.0)
MCHC: 33.5 g/dL (ref 30.0–36.0)
MCV: 91.3 fL (ref 80.0–100.0)
Monocytes Absolute: 0.7 10*3/uL (ref 0.1–1.0)
Monocytes Relative: 9 %
Neutro Abs: 4.4 10*3/uL (ref 1.7–7.7)
Neutrophils Relative %: 58 %
Platelet Count: 229 10*3/uL (ref 150–400)
RBC: 4.71 MIL/uL (ref 4.22–5.81)
RDW: 13.2 % (ref 11.5–15.5)
WBC Count: 7.7 10*3/uL (ref 4.0–10.5)
nRBC: 0 % (ref 0.0–0.2)

## 2021-09-23 ENCOUNTER — Ambulatory Visit: Payer: Medicare HMO | Admitting: Internal Medicine

## 2021-09-23 ENCOUNTER — Encounter: Payer: Self-pay | Admitting: Internal Medicine

## 2021-09-23 ENCOUNTER — Other Ambulatory Visit: Payer: Self-pay

## 2021-09-23 ENCOUNTER — Inpatient Hospital Stay: Payer: Medicare HMO | Admitting: Internal Medicine

## 2021-09-23 VITALS — BP 133/72 | HR 58 | Temp 97.7°F | Resp 18 | Ht 68.0 in | Wt 213.3 lb

## 2021-09-23 DIAGNOSIS — Z79899 Other long term (current) drug therapy: Secondary | ICD-10-CM | POA: Diagnosis not present

## 2021-09-23 DIAGNOSIS — C3490 Malignant neoplasm of unspecified part of unspecified bronchus or lung: Secondary | ICD-10-CM | POA: Diagnosis not present

## 2021-09-23 DIAGNOSIS — I2510 Atherosclerotic heart disease of native coronary artery without angina pectoris: Secondary | ICD-10-CM | POA: Diagnosis not present

## 2021-09-23 DIAGNOSIS — Z87891 Personal history of nicotine dependence: Secondary | ICD-10-CM | POA: Diagnosis not present

## 2021-09-23 DIAGNOSIS — I700 Atherosclerosis of aorta: Secondary | ICD-10-CM | POA: Diagnosis not present

## 2021-09-23 DIAGNOSIS — Z880 Allergy status to penicillin: Secondary | ICD-10-CM | POA: Diagnosis not present

## 2021-09-23 DIAGNOSIS — J984 Other disorders of lung: Secondary | ICD-10-CM | POA: Diagnosis not present

## 2021-09-23 DIAGNOSIS — K7689 Other specified diseases of liver: Secondary | ICD-10-CM | POA: Diagnosis not present

## 2021-09-23 DIAGNOSIS — Z888 Allergy status to other drugs, medicaments and biological substances status: Secondary | ICD-10-CM | POA: Diagnosis not present

## 2021-09-23 DIAGNOSIS — C3431 Malignant neoplasm of lower lobe, right bronchus or lung: Secondary | ICD-10-CM | POA: Diagnosis not present

## 2021-09-23 DIAGNOSIS — Z9049 Acquired absence of other specified parts of digestive tract: Secondary | ICD-10-CM | POA: Diagnosis not present

## 2021-09-23 DIAGNOSIS — E782 Mixed hyperlipidemia: Secondary | ICD-10-CM | POA: Diagnosis not present

## 2021-09-23 DIAGNOSIS — K219 Gastro-esophageal reflux disease without esophagitis: Secondary | ICD-10-CM | POA: Diagnosis not present

## 2021-09-23 DIAGNOSIS — Z85828 Personal history of other malignant neoplasm of skin: Secondary | ICD-10-CM | POA: Diagnosis not present

## 2021-09-23 NOTE — Progress Notes (Signed)
[place] Telephone:([phone]) [phone]   Fax:([phone]) [place], MD [place] [address]  DIAGNOSIS:  stage Ia (T1c, N0, M0) non-small cell lung cancer, moderately differentiated squamous cell carcinoma.  PRIOR THERAPY: status post right lower lobectomy with lymph node sampling on January 16, 2021 with a tumor size of 2.0 cm.  CURRENT THERAPY: Observation  INTERVAL HISTORY: Brandon [name] 66 y.o. male returns to the clinic today for 6 months follow-up visit.  The patient is feeling fine today with no concerning complaints.  The patient is feeling fine today with no concerning complaints.  He denied having any chest pain, shortness of breath, cough or hemoptysis.  He denied having any fever or chills.  He has no nausea, vomiting, diarrhea or constipation.  He has no headache or visual changes.  He is here today for evaluation with repeat CT scan of the chest for restaging of his disease.  MEDICAL HISTORY: Past Medical History:  Diagnosis Date   Arthritis    Basal cell carcinoma    CAD (coronary artery disease) 06/14/2018   Cath on 01/23/2014 with LAD occlusion   Essential hypertension 06/08/2018   Gastroesophageal reflux disease 06/08/2018   GERD (gastroesophageal reflux disease)    Heart disease    Heart failure with reduced ejection fraction ([place]) 06/14/2018   ECHO 01/22/2014 with EF 40-45%   History of chicken pox    History of migraine    Hx of basal cell carcinoma 06/08/2018   04/2017. Left jaw   Hx of CABG    Hyperlipidemia    Inguinal hernia    Mixed hyperlipidemia 06/08/2018   Tobacco use disorder 06/08/2018    ALLERGIES:  is allergic to antihistamines, loratadine-type; penicillins; shellfish allergy; and iodine.  MEDICATIONS:  Current Outpatient Medications  Medication Sig Dispense Refill   Artificial Tear Ointment (DRY EYES OP) Place 1 drop into both eyes daily as needed (Dry eye).      aspirin EC 81 MG tablet Take 1 tablet (81 mg total) by mouth daily. 90 tablet 3   clopidogrel (PLAVIX) 75 MG tablet TAKE 1 TABLET (75 MG TOTAL) BY MOUTH DAILY. 90 tablet 3   ezetimibe (ZETIA) 10 MG tablet Take 1 tablet (10 mg total) by mouth daily. 90 tablet 3   fenofibrate (TRICOR) 145 MG tablet Take 1 tablet (145 mg total) by mouth daily. 90 tablet 3   metoprolol succinate (TOPROL-XL) 50 MG 24 hr tablet Take 1 tablet (50 mg total) by mouth daily. Take with or immediately following a meal. 90 tablet 3   Multiple Vitamin (MULTIVITAMIN) tablet Take 1 tablet by mouth daily.     nitroGLYCERIN (NITROSTAT) 0.4 MG SL tablet Place 1 tablet (0.4 mg total) under the tongue every 5 (five) minutes as needed for chest pain. 25 tablet 3   pantoprazole (PROTONIX) 20 MG tablet Take 1 tablet (20 mg total) by mouth daily. 90 tablet 3   rosuvastatin (CRESTOR) 40 MG tablet TAKE 1 TABLET BY MOUTH EVERY DAY 90 tablet 3   sacubitril-valsartan (ENTRESTO) 49-51 MG Take 1 tablet by mouth 2 (two) times daily. 180 tablet 3   No current facility-administered medications for this visit.    SURGICAL HISTORY:  Past Surgical History:  Procedure Laterality Date   APPENDECTOMY  1960   BRONCHIAL BIOPSY  01/16/2021   Procedure: BRONCHIAL BIOPSIES;  Surgeon: Garner Nash, DO;  Location: [facility];  Service: Pulmonary;;  BRONCHIAL BRUSHINGS  01/16/2021   Procedure: BRONCHIAL BRUSHINGS;  Surgeon: Garner Nash, DO;  Location: [facility];  Service: Pulmonary;;   CORONARY ARTERY BYPASS GRAFT     x 3   FIDUCIAL MARKER PLACEMENT  01/16/2021   Procedure: FIDUCIAL DYE MARKING;  Surgeon: Garner Nash, DO;  Location: [facility];  Service: Pulmonary;;   INGUINAL HERNIA REPAIR  2008   SKIN CANCER EXCISION     Stent placed     in 2005 and in 2014   [place] N/A 01/16/2021   Procedure: [place];  Surgeon: Garner Nash, DO;  Location: [facility];  Service: Pulmonary;  Laterality: N/A;  ION, ICG+MB Dye Marking   VIDEO BRONCHOSCOPY WITH RADIAL ENDOBRONCHIAL ULTRASOUND  01/16/2021   Procedure: VIDEO BRONCHOSCOPY WITH RADIAL ENDOBRONCHIAL ULTRASOUND;  Surgeon: Garner Nash, DO;  Location: [facility];  Service: Pulmonary;;    REVIEW OF SYSTEMS:  A comprehensive review of systems was negative.   PHYSICAL EXAMINATION: General appearance: alert, cooperative, and no distress Head: Normocephalic, without obvious abnormality, atraumatic Neck: no adenopathy, no JVD, supple, symmetrical, trachea midline, and thyroid not enlarged, symmetric, no tenderness/mass/nodules Lymph nodes: Cervical, supraclavicular, and axillary nodes normal. Resp: clear to auscultation bilaterally Back: symmetric, no curvature. ROM normal. No CVA tenderness. Cardio: regular rate and rhythm, S1, S2 normal, no murmur, click, rub or gallop GI: soft, non-tender; bowel sounds normal; no masses,  no organomegaly Extremities: extremities normal, atraumatic, no cyanosis or edema  ECOG PERFORMANCE STATUS: 0 - Asymptomatic  Blood pressure 133/72, pulse (!) 58, temperature 97.7 F (36.5 C), temperature source Tympanic, resp. rate 18, height 5\' 8"  (1.727 m), weight 213 lb 4.8 oz (96.8 kg), SpO2 99 %.  LABORATORY DATA: Lab Results  Component Value Date   WBC 7.7 09/19/2021   HGB 14.4 09/19/2021   HCT 43.0 09/19/2021   MCV 91.3 09/19/2021   PLT 229 09/19/2021      Chemistry      Component Value Date/Time   NA 141 09/19/2021 1442   NA 139 05/22/2021 1158   K 4.4 09/19/2021 1442   CL 105 09/19/2021 1442   CO2 27 09/19/2021 1442   BUN 18 09/19/2021 1442   BUN 15 05/22/2021 1158   CREATININE 1.33 (H) 09/19/2021 1442      Component Value Date/Time   CALCIUM 9.3 09/19/2021 1442   ALKPHOS 33 (L) 09/19/2021 1442   AST 20 09/19/2021 1442   ALT 18 09/19/2021 1442   BILITOT 0.6 09/19/2021 1442       RADIOGRAPHIC STUDIES: CT Chest Wo Contrast  Result  Date: 09/22/2021 CLINICAL DATA:  Non-small-cell lung cancer. Restaging. * Tracking Code: BO * . EXAM: CT CHEST WITHOUT CONTRAST TECHNIQUE: Multidetector CT imaging of the chest was performed following the standard protocol without IV contrast. RADIATION DOSE REDUCTION: This exam was performed according to the departmental dose-optimization program which includes automated exposure control, adjustment of the mA and/or kV according to patient size and/or use of iterative reconstruction technique. COMPARISON:  01/13/2021 FINDINGS: Cardiovascular: The heart size is normal. No substantial pericardial effusion. Status post CABG. Mild atherosclerotic calcification is noted in the wall of the thoracic aorta. Mediastinum/Nodes: No mediastinal lymphadenopathy. No evidence for gross hilar lymphadenopathy although assessment is limited by the lack of intravenous contrast on the current study. The esophagus has normal imaging features. There is no axillary lymphadenopathy. Stable 1.8 cm left thyroid nodule. This has been evaluated on previous imaging. (ref: J  Am Coll Radiol. 2015 Feb;12(2): 143-50). Lungs/Pleura: Surgical changes noted right hemithorax, likely right lower lobectomy. 6 mm left lower lobe nodule on image 90/series 5 is stable in the interval. No new suspicious pulmonary nodule or mass. No focal airspace consolidation. There is no evidence of pleural effusion. Upper Abdomen: Similar appearance left hepatic cyst. Musculoskeletal: No worrisome lytic or sclerotic osseous abnormality. IMPRESSION: 1. Interval resection of the right lower lobe cavitary lesion. No findings to suggests recurrent or metastatic disease on today's exam. 2. Stable 6 mm left lower lobe pulmonary nodule. Continued attention on follow-up recommended. 3. Aortic Atherosclerosis (ICD10-I70.0). Electronically Signed   By: Misty Stanley M.D.   On: 09/22/2021 08:24    ASSESSMENT AND PLAN: This is a very pleasant 66 years old white male with stage Ia  (T1c, N0, M0) non-small cell lung cancer, moderately differentiated squamous cell carcinoma, status post right lower lobectomy with lymph node sampling on January 16, 2021 with a tumor size of 2.0 cm. The patient is currently on observation and he is feeling fine today with no concerning complaints. He had repeat CT scan of the chest performed recently.  I personally and independently reviewed the scans and discussed the results with the patient today. His scan showed no concerning findings for disease recurrence or metastasis. He continues to have stable 6 mm left lower lobe pulmonary nodule that need close monitoring on the upcoming imaging studies. I recommended for the patient to continue on observation with repeat CT scan of the chest in 6 months. He was advised to call immediately if he has any other concerning symptoms in the interval. The patient voices understanding of current disease status and treatment options and is in agreement with the current care plan.  All questions were answered. The patient knows to call the clinic with any problems, questions or concerns. We can certainly see the patient much sooner if necessary.  The total time spent in the appointment was 20 minutes.  Disclaimer: This note was dictated with voice recognition software. Similar sounding words can inadvertently be transcribed and may not be corrected upon review.

## 2021-09-25 DIAGNOSIS — H5203 Hypermetropia, bilateral: Secondary | ICD-10-CM | POA: Diagnosis not present

## 2021-09-25 DIAGNOSIS — H52203 Unspecified astigmatism, bilateral: Secondary | ICD-10-CM | POA: Diagnosis not present

## 2021-09-25 DIAGNOSIS — H524 Presbyopia: Secondary | ICD-10-CM | POA: Diagnosis not present

## 2021-09-25 DIAGNOSIS — H2513 Age-related nuclear cataract, bilateral: Secondary | ICD-10-CM | POA: Diagnosis not present

## 2021-10-13 ENCOUNTER — Encounter: Payer: Self-pay | Admitting: *Deleted

## 2021-10-21 NOTE — Progress Notes (Signed)
Cardiology Office Note   Date:  10/22/2021   ID:  Brandon [name], DOB 08/11/1955, MRN [id number]  PCP:  Lesleigh Noe, MD    No chief complaint on file.  CAD  Wt Readings from Last 3 Encounters:  10/22/21 212 lb (96.2 kg)  09/23/21 213 lb 4.8 oz (96.8 kg)  05/14/21 214 lb 12.8 oz (97.4 kg)       History of Present Illness: Brandon [name] is a 66 y.o. male  With a history of hypertension, CAD.   In 2000, angina was back pain but then went to this left arm.  He had an MI at that time and later had CABG, LIMA and 2 radial arteries.   CAD history: CABG x 3 in 2000, [place] , [place]. [facility] 2005: Stent at [facility].  Angina felt like intermittent back pain.  2015: Stent at [facility], 2.25 x 16 Promus in Radial to RCA.   He has been on Plavix in the past. His PMD tried to stop his Plavix, but he preferred to stay on it.  Had lung tumor removed in 11/2020.   Now lives in [place], [place].  He has been unable to quit smoking over the years.  Now using e-cigarette.  Denies : Chest pain. Dizziness. Leg edema. Nitroglycerin use. Orthopnea. Palpitations. Paroxysmal nocturnal dyspnea.  Syncope.    More SHOB since the lung surgery.  Manageable, stable.  Still walking regularly. No cardiac sx.  Can feel worse with very hot weather and very cold weather.     Past Medical History:  Diagnosis Date   Arthritis    Basal cell carcinoma    CAD (coronary artery disease) 06/14/2018   Cath on 01/23/2014 with LAD occlusion   Essential hypertension 06/08/2018   Gastroesophageal reflux disease 06/08/2018   GERD (gastroesophageal reflux disease)    Heart disease    Heart failure with reduced ejection fraction ([facility]) 06/14/2018   ECHO 01/22/2014 with EF 40-45%   History of chicken pox    History of migraine    Hx of basal cell carcinoma 06/08/2018   04/2017. Left jaw   Hx of CABG    Hyperlipidemia    Inguinal hernia    Mixed hyperlipidemia 06/08/2018   Tobacco use disorder  06/08/2018    Past Surgical History:  Procedure Laterality Date   APPENDECTOMY  1960   BRONCHIAL BIOPSY  01/16/2021   Procedure: BRONCHIAL BIOPSIES;  Surgeon: Garner Nash, DO;  Location: [facility];  Service: Pulmonary;;   BRONCHIAL BRUSHINGS  01/16/2021   Procedure: BRONCHIAL BRUSHINGS;  Surgeon: Garner Nash, DO;  Location: [facility];  Service: Pulmonary;;   CORONARY ARTERY BYPASS GRAFT     x 3   FIDUCIAL MARKER PLACEMENT  01/16/2021   Procedure: FIDUCIAL DYE MARKING;  Surgeon: Garner Nash, DO;  Location: [facility];  Service: Pulmonary;;   INGUINAL HERNIA REPAIR  2008   SKIN CANCER EXCISION     Stent placed     in 2005 and in 2014   [place] N/A 01/16/2021   Procedure: [place];  Surgeon: Garner Nash, DO;  Location: [facility];  Service: Pulmonary;  Laterality: N/A;  ION, ICG+MB Dye Marking   VIDEO BRONCHOSCOPY WITH RADIAL ENDOBRONCHIAL ULTRASOUND  01/16/2021   Procedure: VIDEO BRONCHOSCOPY WITH RADIAL ENDOBRONCHIAL ULTRASOUND;  Surgeon: Garner Nash, DO;  Location: [facility];  Service: Pulmonary;;     Current Outpatient Medications  Medication Sig Dispense Refill  Artificial Tear Ointment (DRY EYES OP) Place 1 drop into both eyes daily as needed (Dry eye).     aspirin EC 81 MG tablet Take 1 tablet (81 mg total) by mouth daily. 90 tablet 3   clopidogrel (PLAVIX) 75 MG tablet TAKE 1 TABLET (75 MG TOTAL) BY MOUTH DAILY. 90 tablet 3   ezetimibe (ZETIA) 10 MG tablet Take 1 tablet (10 mg total) by mouth daily. 90 tablet 3   fenofibrate (TRICOR) 145 MG tablet Take 1 tablet (145 mg total) by mouth daily. 90 tablet 3   metoprolol succinate (TOPROL-XL) 50 MG 24 hr tablet Take 1 tablet (50 mg total) by mouth daily. Take with or immediately following a meal. 90 tablet 3   Multiple Vitamin (MULTIVITAMIN) tablet Take 1 tablet by mouth daily.     nitroGLYCERIN (NITROSTAT) 0.4 MG SL  tablet Place 1 tablet (0.4 mg total) under the tongue every 5 (five) minutes as needed for chest pain. 25 tablet 3   pantoprazole (PROTONIX) 20 MG tablet Take 1 tablet (20 mg total) by mouth daily. 90 tablet 3   rosuvastatin (CRESTOR) 40 MG tablet TAKE 1 TABLET BY MOUTH EVERY DAY 90 tablet 3   sacubitril-valsartan (ENTRESTO) 49-51 MG Take 1 tablet by mouth 2 (two) times daily. 180 tablet 3   No current facility-administered medications for this visit.    Allergies:   Antihistamines, loratadine-type; Penicillins; Shellfish allergy; and Iodine    Social History:  The patient  reports that he quit smoking about 2 years ago. His smoking use included cigarettes. He started smoking about 48 years ago. He has a 60.00 pack-year smoking history. He quit smokeless tobacco use about 18 years ago.  His smokeless tobacco use included chew. He reports current alcohol use. He reports current drug use. Drug: Marijuana.   Family History:  The patient's family history includes Colon cancer (age of onset: 42) in his father; Colon polyps in his sister; Dementia in his mother; Diabetes in his father; Heart attack in his maternal grandfather and maternal grandmother; Heart attack (age of onset: 50) in his mother; Heart attack (age of onset: 66) in his brother; Heart disease in his mother; Prostate cancer in his paternal grandfather; Stroke (age of onset: 24) in his paternal grandmother.    ROS:  Please see the history of present illness.   Otherwise, review of systems are positive for DOE.   All other systems are reviewed and negative.    PHYSICAL EXAM: VS:  BP (!) 118/58   Pulse (!) 59   Ht 5\' 8"  (1.727 m)   Wt 212 lb (96.2 kg)   SpO2 96%   BMI 32.23 kg/m  , BMI Body mass index is 32.23 kg/m. GEN: Well nourished, well developed, in no acute distress HEENT: normal Neck: no JVD, carotid bruits, or masses Cardiac: RRR; no murmurs, rubs, or gallops,no edema  Respiratory:  clear to auscultation bilaterally,  normal work of breathing GI: soft, nontender, nondistended, + BS MS: no deformity or atrophy Skin: warm and dry, no rash Neuro:  Strength and sensation are intact Psych: euthymic mood, full affect   EKG:   The ekg ordered today demonstrates sinus brady, no ST changes-no change from 2022   Recent Labs: 09/19/2021: ALT 18; BUN 18; Creatinine 1.33; Hemoglobin 14.4; Platelet Count 229; Potassium 4.4; Sodium 141   Lipid Panel    Component Value Date/Time   CHOL 132 03/12/2021 0805   TRIG 145 03/12/2021 0805   HDL 40 03/12/2021 0805  CHOLHDL 3.3 03/12/2021 0805   LDLCALC 67 03/12/2021 0805     Other studies Reviewed: Additional studies/ records that were reviewed today with results demonstrating: Labs reviewed.   ASSESSMENT AND PLAN:  CAD/s/p CABG: No angina on medical therapy.  He has noticed a change in his breathing since his lung surgery but it is very mild.  He stays active.  He continues to mow his lawn.  He goes out and walks.  We will refill his nitroglycerin. Chronic systolic heart failure: Appears euvolemic.  Continue current heart failure management. HTN:  Tobacco abuse: Still usig e-cig. Needs AAA screening.  Hyperlipidemia: Recheck lipids in late November early December 2023.  Continue current lipid-lowering therapy.  LDL in November 2023 was 67.  We discussed whole food, plant-based diet.  High-fiber diet.  Minimizing animal products as well.   Current medicines are reviewed at length with the patient today.  The patient concerns regarding his medicines were addressed.  The following changes have been made:  No change  Labs/ tests ordered today include: As above No orders of the defined types were placed in this encounter.   Recommend 150 minutes/week of aerobic exercise Low fat, low carb, high fiber diet recommended  Disposition:   FU in 1 year   Signed, Larae Grooms, MD  10/22/2021 10:01 AM    [facility] [place] [address],  [place], [place]  [address] Phone: [phone]; Fax: [phone]

## 2021-10-22 ENCOUNTER — Encounter: Payer: Self-pay | Admitting: Interventional Cardiology

## 2021-10-22 ENCOUNTER — Ambulatory Visit: Payer: Medicare HMO | Admitting: Interventional Cardiology

## 2021-10-22 VITALS — BP 118/58 | HR 59 | Ht 68.0 in | Wt 212.0 lb

## 2021-10-22 DIAGNOSIS — Z951 Presence of aortocoronary bypass graft: Secondary | ICD-10-CM | POA: Diagnosis not present

## 2021-10-22 DIAGNOSIS — E782 Mixed hyperlipidemia: Secondary | ICD-10-CM

## 2021-10-22 DIAGNOSIS — I25118 Atherosclerotic heart disease of native coronary artery with other forms of angina pectoris: Secondary | ICD-10-CM

## 2021-10-22 DIAGNOSIS — Z720 Tobacco use: Secondary | ICD-10-CM

## 2021-10-22 DIAGNOSIS — I10 Essential (primary) hypertension: Secondary | ICD-10-CM | POA: Diagnosis not present

## 2021-10-22 MED ORDER — NITROGLYCERIN 0.4 MG SL SUBL: 0.4000 mg | SUBLINGUAL_TABLET | SUBLINGUAL | 3 refills | Status: AC | PRN

## 2021-10-22 NOTE — Patient Instructions (Addendum)
Medication Instructions:  Your physician recommends that you continue on your current medications as directed. Please refer to the Current Medication list given to you today.  *If you need a refill on your cardiac medications before your next appointment, please call your pharmacy*  Lab Work: Your physician recommends that you return for lab work in: November/December for fasting lab work for [employer] and Lipid panel.   If you have labs (blood work) drawn today and your tests are completely normal, you will receive your results only by: [place] (if you have MyChart) OR A paper copy in the mail If you have any lab test that is abnormal or we need to change your treatment, we will call you to review the results.  Testing/Procedures: Your physician has requested that you have an abdominal aorta duplex. During this test, an ultrasound is used to evaluate the aorta. Allow 30 minutes for this exam. Do not eat after midnight the day before and avoid carbonated beverages   Follow-Up: At [facility], you and your health needs are our priority.  As part of our continuing mission to provide you with exceptional heart care, we have created designated Provider Care Teams.  These Care Teams include your primary Cardiologist (physician) and Advanced Practice Providers (APPs -  Physician Assistants and Nurse Practitioners) who all work together to provide you with the care you need, when you need it.  We recommend signing up for the patient portal called "MyChart".  Sign up information is provided on this After Visit Summary.  MyChart is used to connect with patients for Virtual Visits (Telemedicine).  Patients are able to view lab/test results, encounter notes, upcoming appointments, etc.  Non-urgent messages can be sent to your provider as well.   To learn more about what you can do with MyChart, go to [email or web address].    Your next appointment:   12 month(s)  The format for your next  appointment:   In Person  Provider:   Larae Grooms, MD {   Important Information About Sugar

## 2021-10-23 ENCOUNTER — Ambulatory Visit (HOSPITAL_COMMUNITY)
Admission: RE | Admit: 2021-10-23 | Discharge: 2021-10-23 | Disposition: A | Discharge status: Home / Self Care | Payer: Medicare HMO | Source: Ambulatory Visit | Attending: Cardiology | Admitting: Cardiology

## 2021-10-23 DIAGNOSIS — Z951 Presence of aortocoronary bypass graft: Secondary | ICD-10-CM

## 2021-10-23 DIAGNOSIS — F17211 Nicotine dependence, cigarettes, in remission: Secondary | ICD-10-CM | POA: Diagnosis not present

## 2021-10-23 DIAGNOSIS — R69 Illness, unspecified: Secondary | ICD-10-CM | POA: Diagnosis not present

## 2021-10-23 DIAGNOSIS — E669 Obesity, unspecified: Secondary | ICD-10-CM | POA: Diagnosis not present

## 2021-10-23 DIAGNOSIS — Z136 Encounter for screening for cardiovascular disorders: Secondary | ICD-10-CM | POA: Insufficient documentation

## 2021-10-23 DIAGNOSIS — F17290 Nicotine dependence, other tobacco product, uncomplicated: Secondary | ICD-10-CM | POA: Diagnosis not present

## 2021-10-23 DIAGNOSIS — I25118 Atherosclerotic heart disease of native coronary artery with other forms of angina pectoris: Secondary | ICD-10-CM

## 2021-10-23 DIAGNOSIS — E782 Mixed hyperlipidemia: Secondary | ICD-10-CM | POA: Diagnosis not present

## 2021-10-23 DIAGNOSIS — R143 Flatulence: Secondary | ICD-10-CM | POA: Diagnosis not present

## 2021-10-23 DIAGNOSIS — F17210 Nicotine dependence, cigarettes, uncomplicated: Secondary | ICD-10-CM | POA: Diagnosis not present

## 2021-10-23 DIAGNOSIS — I10 Essential (primary) hypertension: Secondary | ICD-10-CM | POA: Diagnosis not present

## 2021-10-23 DIAGNOSIS — Z720 Tobacco use: Secondary | ICD-10-CM

## 2021-10-23 DIAGNOSIS — I708 Atherosclerosis of other arteries: Secondary | ICD-10-CM | POA: Diagnosis not present

## 2021-10-27 IMAGING — CT CT CHEST LUNG CANCER SCREENING LOW DOSE W/O CM
1 series · 10 of 10 positions shown, 13 images · non-contrast
Comparison: None.

CLINICAL DATA: 65-year-old asymptomatic male former smoker with 72
pack-year smoking history.

EXAM:
CT CHEST WITHOUT CONTRAST LOW-DOSE FOR LUNG CANCER SCREENING
TECHNIQUE: Multidetector CT imaging of the chest was performed following the
standard protocol without IV contrast.

[ct lung segmentation data · axial · 0.79mm/px · z∈[+1230,+1230]mm · 10 of 311 frames shown]
[frame 1/311  mediastinal]
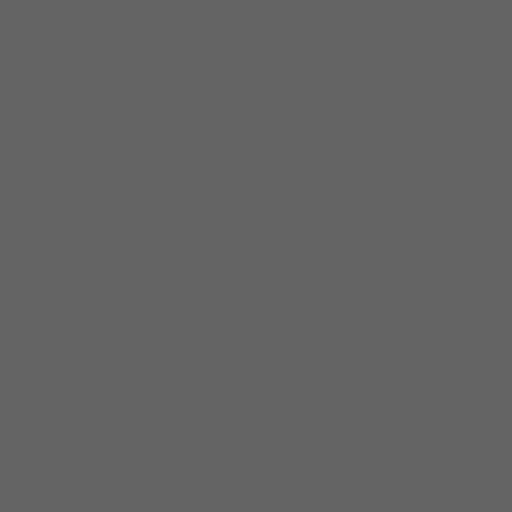
[frame 1/311  lung]
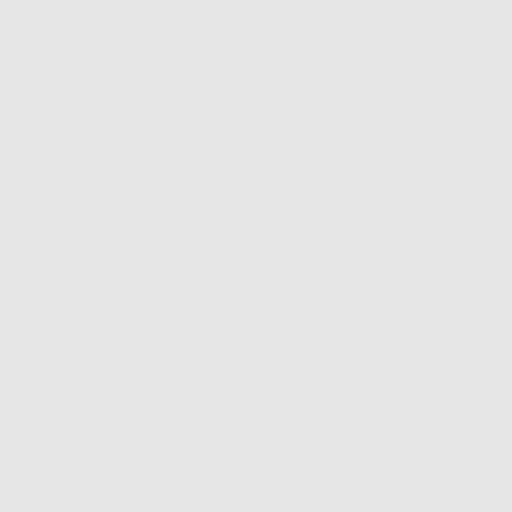
[frame 35/311  lung]
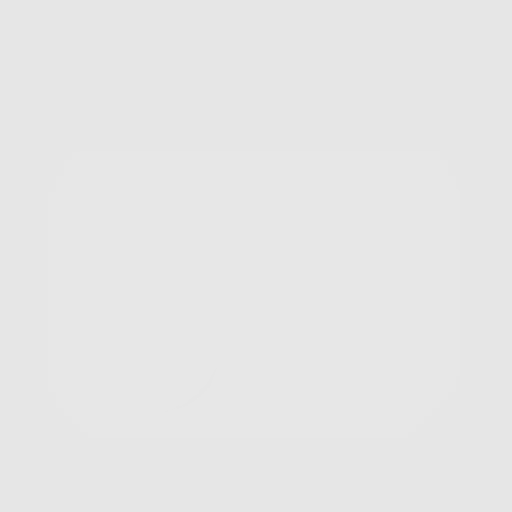
[frame 69/311  lung]
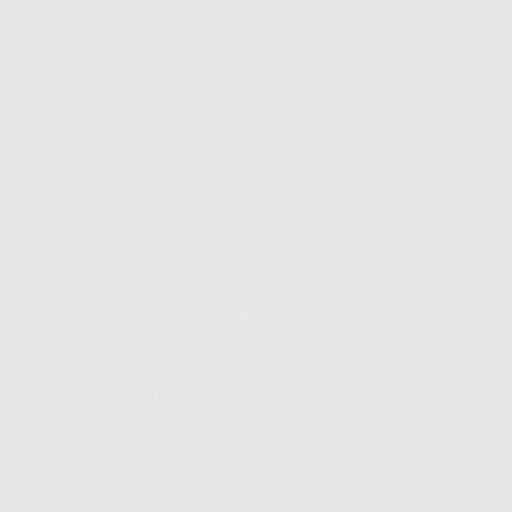
[frame 104/311  lung]
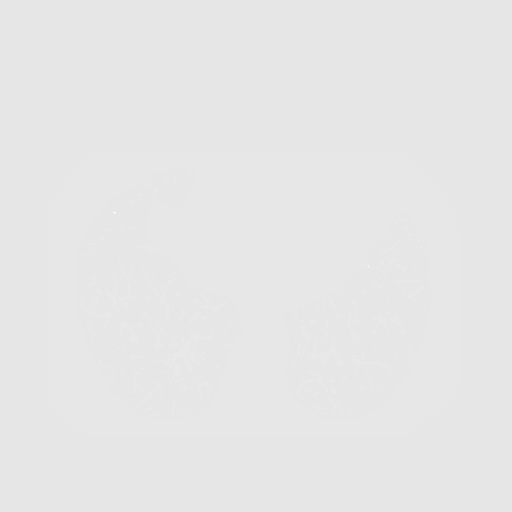
[frame 138/311  mediastinal]
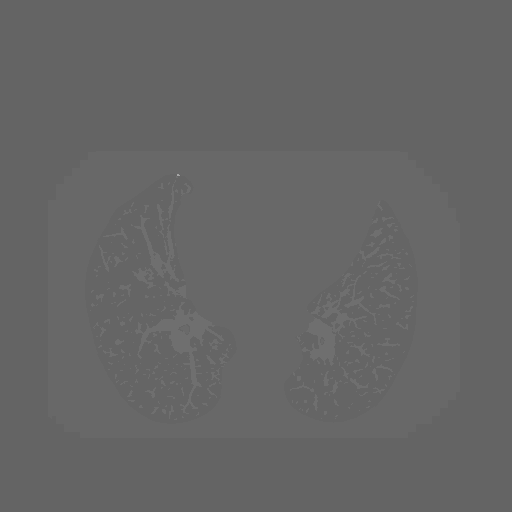
[frame 138/311  lung]
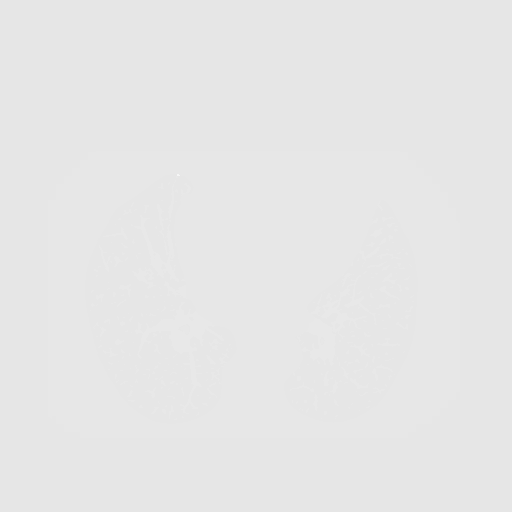
[frame 173/311  lung]
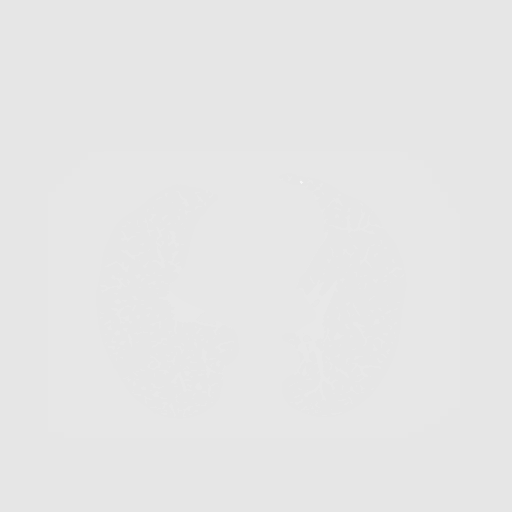
[frame 207/311  lung]
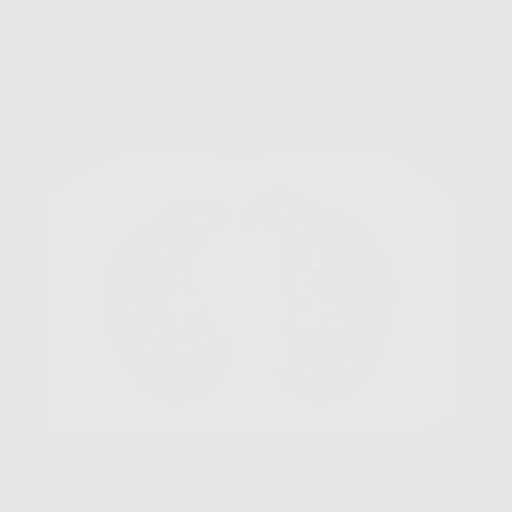
[frame 242/311  lung]
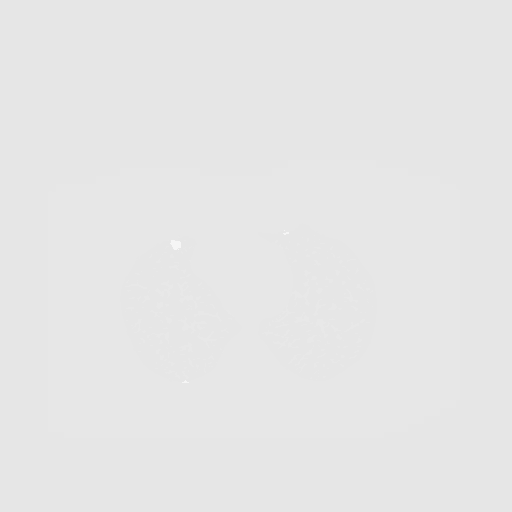
[frame 276/311  mediastinal]
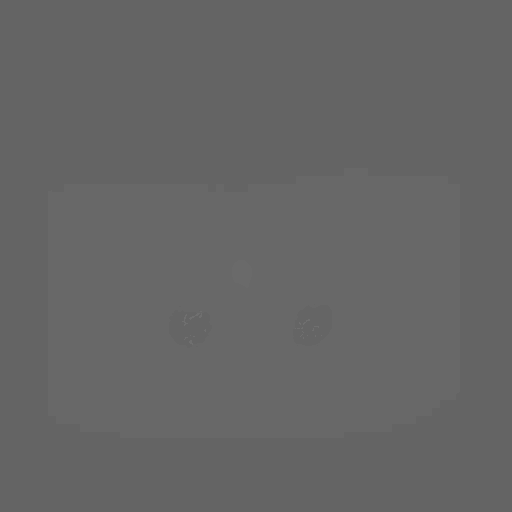
[frame 276/311  lung]
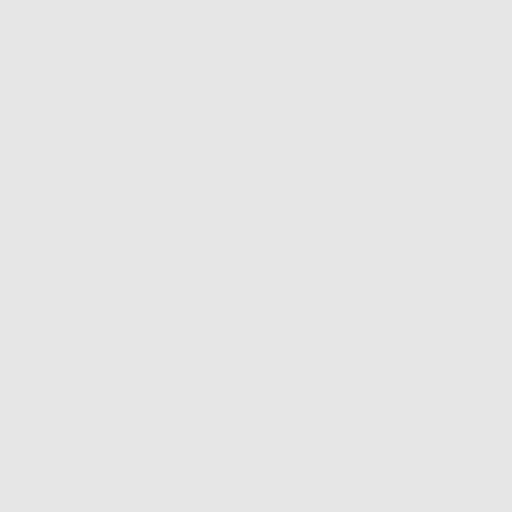
[frame 311/311  lung]
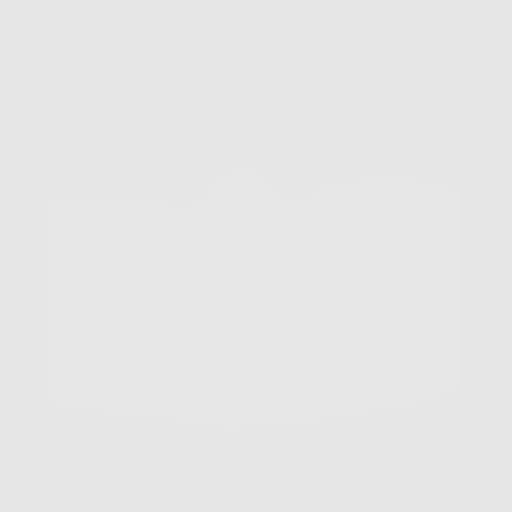

[10 of 10 positions shown; findings below may reference images not displayed]

FINDINGS: Cardiovascular: Mild cardiomegaly. No significant pericardial
effusion/thickening. Three-vessel coronary atherosclerosis status
post CABG. Atherosclerotic nonaneurysmal thoracic aorta. Normal
caliber pulmonary arteries.

Mediastinum/Nodes: Hypodense 1.8 cm left thyroid nodule.
Unremarkable esophagus. No pathologically enlarged axillary,
mediastinal or hilar lymph nodes, noting limited sensitivity for the
detection of hilar adenopathy on this noncontrast study.

Lungs/Pleura: No pneumothorax. No pleural effusion. Mild
centrilobular and paraseptal emphysema with diffuse bronchial wall
thickening. No acute consolidative airspace disease or lung masses.
Multiple scattered pulmonary nodules, to most of which is of which
is a partially cavitary solid medial right lower lobe irregular
nodule measuring approximately 13.4 mm in volume derived mean
diameter (series 3/image 182).

Upper abdomen: Simple 1.9 cm lateral segment left liver cyst.

Musculoskeletal: No aggressive appearing focal osseous lesions.
Discontinuities in the 2 superior most sternotomy wires. Otherwise
intact sternotomy wires. Moderate thoracic spondylosis.
IMPRESSION: 1. Lung-RADS 4A, suspicious. Follow up low-dose chest CT without
contrast in 3 months (please use the following order, "CT CHEST LCS
NODULE FOLLOW-UP W/O CM") is recommended. Medial right lower lobe
irregular cavitary 13.4 mm pulmonary nodule, suspicious for primary
bronchogenic malignancy. PET-CT may be considered for further
characterization at this time.
2. Mild cardiomegaly.
3. Aortic Atherosclerosis ([id number]-[id number]) and Emphysema (3GWMG-39O.3).

These results will be called to the ordering clinician or
representative by the Radiologist Assistant, and communication
documented in the PACS or [REDACTED].

## 2021-11-04 ENCOUNTER — Telehealth: Payer: Self-pay | Admitting: Family Medicine

## 2021-11-04 NOTE — Telephone Encounter (Signed)
Spoke with patient to schedule Medicare Annual Wellness Visit (AWV) either virtually or phone  He wanted a call back mid aug going out of town   awvi 05/22/19 per [facility]    This should be a 45 minute visit.  I left my direct # [phone]

## 2021-11-18 ENCOUNTER — Telehealth: Payer: Self-pay

## 2021-11-18 ENCOUNTER — Other Ambulatory Visit: Payer: Self-pay

## 2021-11-18 ENCOUNTER — Ambulatory Visit
Admission: RE | Admit: 2021-11-18 | Discharge: 2021-11-18 | Disposition: A | Discharge status: Home / Self Care | Payer: Medicare HMO | Source: Ambulatory Visit | Attending: Physician Assistant | Admitting: Physician Assistant

## 2021-11-18 VITALS — BP 125/74 | HR 65 | Temp 98.2°F | Resp 18

## 2021-11-18 DIAGNOSIS — B029 Zoster without complications: Secondary | ICD-10-CM

## 2021-11-18 MED ORDER — VALACYCLOVIR HCL 1 G PO TABS: 1000.0000 mg | ORAL_TABLET | Freq: Three times a day (TID) | ORAL | 0 refills | Status: DC

## 2021-11-18 NOTE — ED Provider Notes (Signed)
[facility]    CSN: [id number] Arrival date & time: 11/18/21  1438      History   Chief Complaint Chief Complaint  Patient presents with   Rash    Entered by patient    HPI Brandon [name] is a 66 y.o. male.   Patient here today for evaluation of a rash to the right side of his back and right abdomen that he first noticed a few days ago.  He initially he had rash to his abdomen but yesterday when he went to see his sister she noted an area to his back and then today this is seem to spread.  He reports that rash is itchy and sometimes tender to touch.  He has not had fever.  He denies any shortness of breath.  He does not report treatment.   The history is provided by the patient.  Rash Associated symptoms: no fever and no shortness of breath     Past Medical History:  Diagnosis Date   Arthritis    Basal cell carcinoma    CAD (coronary artery disease) 06/14/2018   Cath on 01/23/2014 with LAD occlusion   Essential hypertension 06/08/2018   Gastroesophageal reflux disease 06/08/2018   GERD (gastroesophageal reflux disease)    Heart disease    Heart failure with reduced ejection fraction ([place]) 06/14/2018   ECHO 01/22/2014 with EF 40-45%   History of chicken pox    History of migraine    Hx of basal cell carcinoma 06/08/2018   04/2017. 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 ([place]) 03/25/2021   S/P lobectomy of lung 01/16/2021   Pulmonary nodule 01/07/2021   Dermatitis 10/03/2020   Atypical chest pain 06/05/2020   Belching 06/05/2020   Antiplatelet or antithrombotic long-term use 06/05/2020   Family history of colon cancer in father 06/05/2020   ETD (eustachian tube dysfunction) 04/16/2020   Foot pain, bilateral 06/15/2019   Heart failure with reduced  ejection fraction ([place]) 06/14/2018   CAD (coronary artery disease) 06/14/2018   Essential hypertension 06/08/2018   Mixed hyperlipidemia 06/08/2018   Hx of CABG 06/08/2018   Hx of basal cell carcinoma 06/08/2018   Gastroesophageal reflux disease 06/08/2018    Past Surgical History:  Procedure Laterality Date   APPENDECTOMY  1960   BRONCHIAL BIOPSY  01/16/2021   Procedure: BRONCHIAL BIOPSIES;  Surgeon: Garner Nash, DO;  Location: [facility];  Service: Pulmonary;;   BRONCHIAL BRUSHINGS  01/16/2021   Procedure: BRONCHIAL BRUSHINGS;  Surgeon: Garner Nash, DO;  Location: [facility];  Service: Pulmonary;;   CORONARY ARTERY BYPASS GRAFT     x 3   FIDUCIAL MARKER PLACEMENT  01/16/2021   Procedure: FIDUCIAL DYE MARKING;  Surgeon: Garner Nash, DO;  Location: [facility];  Service: Pulmonary;;   INGUINAL HERNIA REPAIR  2008   SKIN CANCER EXCISION     Stent placed     in 2005 and in 2014   [place] N/A 01/16/2021   Procedure: VIDEO BRONCHOSCOPY WITH ENDOBRONCHIAL NAVIGATION;  Surgeon: Garner Nash, DO;  Location: [facility];  Service: Pulmonary;  Laterality: N/A;  ION, ICG+MB Dye Marking   VIDEO BRONCHOSCOPY WITH RADIAL ENDOBRONCHIAL ULTRASOUND  01/16/2021   Procedure: VIDEO  BRONCHOSCOPY WITH RADIAL ENDOBRONCHIAL ULTRASOUND;  Surgeon: Garner Nash, DO;  Location: [facility];  Service: Pulmonary;;       Home Medications    Prior to Admission medications   Medication Sig Start Date End Date Taking? Authorizing Provider  valACYclovir (VALTREX) 1000 MG tablet Take 1 tablet (1,000 mg total) by mouth 3 (three) times daily. 11/18/21  Yes Francene Finders, PA-C  Artificial Tear Ointment (DRY EYES OP) Place 1 drop into both eyes daily as needed (Dry eye).    [provider]  aspirin EC 81 MG tablet Take 1 tablet (81 mg total) by mouth daily. 12/13/18   Jettie Booze, MD  clopidogrel (PLAVIX) 75 MG tablet TAKE 1 TABLET  (75 MG TOTAL) BY MOUTH DAILY. 03/05/21   Swinyer, Lanice Schwab, NP  ezetimibe (ZETIA) 10 MG tablet Take 1 tablet (10 mg total) by mouth daily. 03/05/21   Swinyer, Lanice Schwab, NP  fenofibrate (TRICOR) 145 MG tablet Take 1 tablet (145 mg total) by mouth daily. 03/05/21   Swinyer, Lanice Schwab, NP  metoprolol succinate (TOPROL-XL) 50 MG 24 hr tablet Take 1 tablet (50 mg total) by mouth daily. Take with or immediately following a meal. 04/10/21   Jettie Booze, MD  Multiple Vitamin (MULTIVITAMIN) tablet Take 1 tablet by mouth daily.    [provider]  nitroGLYCERIN (NITROSTAT) 0.4 MG SL tablet Place 1 tablet (0.4 mg total) under the tongue every 5 (five) minutes as needed for chest pain. 10/22/21   Jettie Booze, MD  pantoprazole (PROTONIX) 20 MG tablet Take 1 tablet (20 mg total) by mouth daily. 03/05/21   Swinyer, Lanice Schwab, NP  rosuvastatin (CRESTOR) 40 MG tablet TAKE 1 TABLET BY MOUTH EVERY DAY 03/05/21   Swinyer, Lanice Schwab, NP  sacubitril-valsartan (ENTRESTO) 49-51 MG Take 1 tablet by mouth 2 (two) times daily. 05/27/21   Jettie Booze, MD    Family History Family History  Problem Relation Age of Onset   Dementia Mother    Heart attack Mother 74   Heart disease Mother    Colon cancer Father 21   Diabetes Father    Colon polyps Sister    Heart attack Brother 41   Heart attack Maternal Grandmother    Heart attack Maternal Grandfather    Stroke Paternal Grandmother 75   Prostate cancer Paternal Grandfather     Social History Social History   Tobacco Use   Smoking status: Former    Packs/day: 1.50    Years: 40.00    Total pack years: 60.00    Types: Cigarettes    Start date: 18    Quit date: 04/21/2019    Years since quitting: 2.5   Smokeless tobacco: Former    Types: Chew    Quit date: 04/21/2003  Vaping Use   Vaping Use: Every day   Substances: Nicotine  Substance Use Topics   Alcohol use: Yes    Comment: 2 to 3 beers daily   Drug use: Yes     Types: Marijuana    Comment: once a week     Allergies   Antihistamines, loratadine-type; Penicillins; Shellfish allergy; and Iodine   Review of Systems Review of Systems  Constitutional:  Negative for chills and fever.  Eyes:  Negative for discharge and redness.  Respiratory:  Negative for shortness of breath.   Skin:  Positive for rash.  Neurological:  Negative for numbness.     Physical Exam Triage Vital Signs ED Triage Vitals  Enc Vitals Group     BP      Pulse      Resp      Temp      Temp src      SpO2      Weight      Height      Head Circumference      Peak Flow      Pain Score      Pain Loc      Pain Edu?      Excl. in [place]?    No data found.  Updated Vital Signs BP 125/74 (BP Location: Left Arm)   Pulse 65   Temp 98.2 F (36.8 C) (Oral)   Resp 18   SpO2 98%   Physical Exam Vitals and nursing note reviewed.  Constitutional:      General: He is not in acute distress.    Appearance: Normal appearance. He is not ill-appearing.  HENT:     Head: Normocephalic and atraumatic.  Eyes:     Conjunctiva/sclera: Conjunctivae normal.  Cardiovascular:     Rate and Rhythm: Normal rate.  Pulmonary:     Effort: Pulmonary effort is normal.  Skin:    Comments: Clusters of mildly erythematous vesicular lesions following dermatomal pattern to right back, do not cross midline, similar excoriated lesions to right side/ abdomen  Neurological:     Mental Status: He is alert.  Psychiatric:        Mood and Affect: Mood normal.        Behavior: Behavior normal.        Thought Content: Thought content normal.      UC Treatments / Results  Labs (all labs ordered are listed, but only abnormal results are displayed) Labs Reviewed - No data to display  EKG   Radiology No results found.  Procedures Procedures (including critical care time)  Medications Ordered in UC Medications - No data to display  Initial Impression / Assessment and Plan / UC Course   I have reviewed the triage vital signs and the nursing notes.  Pertinent labs & imaging results that were available during my care of the patient were reviewed by me and considered in my medical decision making (see chart for details).    Valacyclovir prescribed for suspected shingles.  Recommended follow-up with PCP if no improvement or symptoms worsen.  Final Clinical Impressions(s) / UC Diagnoses   Final diagnoses:  Herpes zoster without complication   Discharge Instructions   None    ED Prescriptions     Medication Sig Dispense Auth. Provider   valACYclovir (VALTREX) 1000 MG tablet Take 1 tablet (1,000 mg total) by mouth 3 (three) times daily. 21 tablet Francene Finders, PA-C      PDMP not reviewed this encounter.   Francene Finders, PA-C 11/18/21 1537

## 2021-11-18 NOTE — ED Triage Notes (Signed)
Pt here for rash to right side of back that is itching and tender to touch

## 2021-11-18 NOTE — Telephone Encounter (Signed)
Dr Einar Pheasant asked me to call pt; pt scheduled on Dr Cody's appt schedule for 11/19/21 but treatment window for shingles will end today. I spoke with pt and he said the rash is on rt side of back; initially was itchy but now itchy and tender. Blisters are seen and pt said his sister looked at and said has spread today from 3 - 4" to 8-9 ". Cancelled pt appt with Dr Einar Pheasant on 11/19/21 and I made appt at [facility] for pt 11/18/21 at 3 PM (this is close to pts home).sending note to Dr Einar Pheasant and [facility] CMA.

## 2021-11-19 ENCOUNTER — Ambulatory Visit: Payer: Medicare HMO | Admitting: Family Medicine

## 2021-11-19 NOTE — Telephone Encounter (Signed)
Appreciate the phone call. Pt treated in time for Shingles

## 2021-11-27 DIAGNOSIS — D485 Neoplasm of uncertain behavior of skin: Secondary | ICD-10-CM | POA: Diagnosis not present

## 2021-11-27 DIAGNOSIS — L821 Other seborrheic keratosis: Secondary | ICD-10-CM | POA: Diagnosis not present

## 2021-11-27 DIAGNOSIS — L986 Other infiltrative disorders of the skin and subcutaneous tissue: Secondary | ICD-10-CM | POA: Diagnosis not present

## 2021-11-27 DIAGNOSIS — D171 Benign lipomatous neoplasm of skin and subcutaneous tissue of trunk: Secondary | ICD-10-CM | POA: Diagnosis not present

## 2021-11-27 DIAGNOSIS — L718 Other rosacea: Secondary | ICD-10-CM | POA: Diagnosis not present

## 2021-11-27 DIAGNOSIS — L570 Actinic keratosis: Secondary | ICD-10-CM | POA: Diagnosis not present

## 2021-11-27 DIAGNOSIS — D1801 Hemangioma of skin and subcutaneous tissue: Secondary | ICD-10-CM | POA: Diagnosis not present

## 2021-12-03 NOTE — Telephone Encounter (Signed)
Seems like encounter was open in error so closing encounter.  

## 2021-12-11 DIAGNOSIS — L988 Other specified disorders of the skin and subcutaneous tissue: Secondary | ICD-10-CM | POA: Diagnosis not present

## 2021-12-11 DIAGNOSIS — D485 Neoplasm of uncertain behavior of skin: Secondary | ICD-10-CM | POA: Diagnosis not present

## 2022-01-12 ENCOUNTER — Other Ambulatory Visit: Payer: Self-pay | Admitting: Interventional Cardiology

## 2022-01-27 ENCOUNTER — Encounter: Payer: Medicare HMO | Admitting: Family Medicine

## 2022-02-13 ENCOUNTER — Telehealth: Payer: Self-pay | Admitting: Family Medicine

## 2022-02-13 NOTE — Telephone Encounter (Signed)
Left message for patient to call back and schedule Medicare Annual Wellness Visit (AWV) either virtually or phone. Left  my Herbie Drape number [phone]    awvi 05/22/19 per [facility]    45 min for awv-i and in office appointments 30 min for awv-s  phone/virtual appointments

## 2022-02-27 ENCOUNTER — Other Ambulatory Visit: Payer: Self-pay

## 2022-02-27 MED ORDER — ENTRESTO 49-51 MG PO TABS: 1.0000 | ORAL_TABLET | Freq: Two times a day (BID) | ORAL | 2 refills | Status: DC

## 2022-03-03 ENCOUNTER — Other Ambulatory Visit: Payer: Self-pay | Admitting: Nurse Practitioner

## 2022-03-03 ENCOUNTER — Other Ambulatory Visit: Payer: Self-pay

## 2022-03-03 MED ORDER — FENOFIBRATE 145 MG PO TABS: 145.0000 mg | ORAL_TABLET | Freq: Every day | ORAL | 3 refills | Status: DC

## 2022-03-06 ENCOUNTER — Ambulatory Visit: Payer: Medicare HMO | Attending: Interventional Cardiology

## 2022-03-06 DIAGNOSIS — I25118 Atherosclerotic heart disease of native coronary artery with other forms of angina pectoris: Secondary | ICD-10-CM | POA: Diagnosis not present

## 2022-03-06 DIAGNOSIS — Z720 Tobacco use: Secondary | ICD-10-CM | POA: Diagnosis not present

## 2022-03-06 DIAGNOSIS — I10 Essential (primary) hypertension: Secondary | ICD-10-CM | POA: Diagnosis not present

## 2022-03-06 DIAGNOSIS — Z951 Presence of aortocoronary bypass graft: Secondary | ICD-10-CM | POA: Diagnosis not present

## 2022-03-06 DIAGNOSIS — E782 Mixed hyperlipidemia: Secondary | ICD-10-CM | POA: Diagnosis not present

## 2022-03-07 LAB — LIPID PANEL
Chol/HDL Ratio: 3.5 ratio (ref 0.0–5.0)
Cholesterol, Total: 140 mg/dL (ref 100–199)
HDL: 40 mg/dL (ref >39)
LDL Chol Calc (NIH): 64 mg/dL (ref 0–99)
Triglycerides: 220 mg/dL — ABNORMAL HIGH (ref 0–149)
VLDL Cholesterol Cal: 36 mg/dL (ref 5–40)

## 2022-03-07 LAB — COMPREHENSIVE METABOLIC PANEL
ALT: 14 IU/L (ref 0–44)
AST: 17 IU/L (ref 0–40)
Albumin/Globulin Ratio: 2.7 — ABNORMAL HIGH (ref 1.2–2.2)
Albumin: 4.8 g/dL (ref 3.9–4.9)
Alkaline Phosphatase: 39 IU/L — ABNORMAL LOW (ref 44–121)
BUN/Creatinine Ratio: 15 (ref 10–24)
BUN: 18 mg/dL (ref 8–27)
Bilirubin Total: 0.7 mg/dL (ref 0.0–1.2)
CO2: 26 mmol/L (ref 20–29)
Calcium: 10.1 mg/dL (ref 8.6–10.2)
Chloride: 99 mmol/L (ref 96–106)
Creatinine, Ser: 1.24 mg/dL (ref 0.76–1.27)
Globulin, Total: 1.8 g/dL (ref 1.5–4.5)
Glucose: 130 mg/dL — ABNORMAL HIGH (ref 70–99)
Potassium: 4.4 mmol/L (ref 3.5–5.2)
Sodium: 140 mmol/L (ref 134–144)
Total Protein: 6.6 g/dL (ref 6.0–8.5)
eGFR: 63 mL/min/{1.73_m2} (ref >59)

## 2022-03-16 ENCOUNTER — Other Ambulatory Visit: Payer: Self-pay

## 2022-03-16 ENCOUNTER — Other Ambulatory Visit: Payer: Self-pay | Admitting: *Deleted

## 2022-03-16 DIAGNOSIS — R7301 Impaired fasting glucose: Secondary | ICD-10-CM

## 2022-03-16 NOTE — Progress Notes (Signed)
This nurse spoke with [name]  at Dr. Hassell Done office.  Confirmed that HGB A1C will be drawn with labs on 03/23/2022. No further concerns or questions at this time.

## 2022-03-23 ENCOUNTER — Inpatient Hospital Stay: Payer: Medicare HMO | Attending: Internal Medicine

## 2022-03-23 ENCOUNTER — Ambulatory Visit (HOSPITAL_COMMUNITY)
Admission: RE | Admit: 2022-03-23 | Discharge: 2022-03-23 | Disposition: A | Discharge status: Home / Self Care | Payer: Medicare HMO | Source: Ambulatory Visit | Attending: Internal Medicine | Admitting: Internal Medicine

## 2022-03-23 ENCOUNTER — Other Ambulatory Visit: Payer: Medicare HMO

## 2022-03-23 ENCOUNTER — Other Ambulatory Visit: Payer: Self-pay

## 2022-03-23 DIAGNOSIS — Z9049 Acquired absence of other specified parts of digestive tract: Secondary | ICD-10-CM | POA: Insufficient documentation

## 2022-03-23 DIAGNOSIS — R911 Solitary pulmonary nodule: Secondary | ICD-10-CM | POA: Diagnosis not present

## 2022-03-23 DIAGNOSIS — C3490 Malignant neoplasm of unspecified part of unspecified bronchus or lung: Secondary | ICD-10-CM

## 2022-03-23 DIAGNOSIS — Z888 Allergy status to other drugs, medicaments and biological substances status: Secondary | ICD-10-CM | POA: Insufficient documentation

## 2022-03-23 DIAGNOSIS — Z85828 Personal history of other malignant neoplasm of skin: Secondary | ICD-10-CM | POA: Insufficient documentation

## 2022-03-23 DIAGNOSIS — E785 Hyperlipidemia, unspecified: Secondary | ICD-10-CM | POA: Insufficient documentation

## 2022-03-23 DIAGNOSIS — I10 Essential (primary) hypertension: Secondary | ICD-10-CM | POA: Insufficient documentation

## 2022-03-23 DIAGNOSIS — K7689 Other specified diseases of liver: Secondary | ICD-10-CM | POA: Insufficient documentation

## 2022-03-23 DIAGNOSIS — Z87891 Personal history of nicotine dependence: Secondary | ICD-10-CM | POA: Insufficient documentation

## 2022-03-23 DIAGNOSIS — Z7902 Long term (current) use of antithrombotics/antiplatelets: Secondary | ICD-10-CM | POA: Insufficient documentation

## 2022-03-23 DIAGNOSIS — Z79899 Other long term (current) drug therapy: Secondary | ICD-10-CM | POA: Insufficient documentation

## 2022-03-23 DIAGNOSIS — I700 Atherosclerosis of aorta: Secondary | ICD-10-CM | POA: Insufficient documentation

## 2022-03-23 DIAGNOSIS — Z880 Allergy status to penicillin: Secondary | ICD-10-CM | POA: Insufficient documentation

## 2022-03-23 DIAGNOSIS — I2510 Atherosclerotic heart disease of native coronary artery without angina pectoris: Secondary | ICD-10-CM | POA: Insufficient documentation

## 2022-03-23 DIAGNOSIS — Z91041 Radiographic dye allergy status: Secondary | ICD-10-CM | POA: Insufficient documentation

## 2022-03-23 DIAGNOSIS — E041 Nontoxic single thyroid nodule: Secondary | ICD-10-CM | POA: Insufficient documentation

## 2022-03-23 DIAGNOSIS — C3431 Malignant neoplasm of lower lobe, right bronchus or lung: Secondary | ICD-10-CM | POA: Insufficient documentation

## 2022-03-23 LAB — CMP (CANCER CENTER ONLY)
ALT: 15 U/L (ref 0–44)
AST: 15 U/L (ref 15–41)
Albumin: 4.6 g/dL (ref 3.5–5.0)
Alkaline Phosphatase: 30 U/L — ABNORMAL LOW (ref 38–126)
Anion gap: 7 (ref 5–15)
BUN: 20 mg/dL (ref 8–23)
CO2: 29 mmol/L (ref 22–32)
Calcium: 9.8 mg/dL (ref 8.9–10.3)
Chloride: 103 mmol/L (ref 98–111)
Creatinine: 1.21 mg/dL (ref 0.61–1.24)
GFR, Estimated: >60 mL/min (ref >60)
Glucose, Bld: 82 mg/dL (ref 70–99)
Potassium: 4.6 mmol/L (ref 3.5–5.1)
Sodium: 139 mmol/L (ref 135–145)
Total Bilirubin: 0.6 mg/dL (ref 0.3–1.2)
Total Protein: 6.9 g/dL (ref 6.5–8.1)

## 2022-03-23 LAB — CBC WITH DIFFERENTIAL (CANCER CENTER ONLY)
Abs Immature Granulocytes: 0.01 10*3/uL (ref 0.00–0.07)
Basophils Absolute: 0 10*3/uL (ref 0.0–0.1)
Basophils Relative: 1 %
Eosinophils Absolute: 0.2 10*3/uL (ref 0.0–0.5)
Eosinophils Relative: 2 %
HCT: 43.1 % (ref 39.0–52.0)
Hemoglobin: 14.7 g/dL (ref 13.0–17.0)
Immature Granulocytes: 0 %
Lymphocytes Relative: 33 %
Lymphs Abs: 2.4 10*3/uL (ref 0.7–4.0)
MCH: 31.5 pg (ref 26.0–34.0)
MCHC: 34.1 g/dL (ref 30.0–36.0)
MCV: 92.5 fL (ref 80.0–100.0)
Monocytes Absolute: 0.7 10*3/uL (ref 0.1–1.0)
Monocytes Relative: 10 %
Neutro Abs: 3.9 10*3/uL (ref 1.7–7.7)
Neutrophils Relative %: 54 %
Platelet Count: 229 10*3/uL (ref 150–400)
RBC: 4.66 MIL/uL (ref 4.22–5.81)
RDW: 12.6 % (ref 11.5–15.5)
WBC Count: 7.3 10*3/uL (ref 4.0–10.5)
nRBC: 0 % (ref 0.0–0.2)

## 2022-03-25 ENCOUNTER — Inpatient Hospital Stay: Payer: Medicare HMO | Admitting: Internal Medicine

## 2022-03-25 ENCOUNTER — Other Ambulatory Visit: Payer: Self-pay

## 2022-03-25 VITALS — BP 140/67 | HR 67 | Temp 98.5°F | Resp 15 | Wt 213.7 lb

## 2022-03-25 DIAGNOSIS — Z87891 Personal history of nicotine dependence: Secondary | ICD-10-CM | POA: Diagnosis not present

## 2022-03-25 DIAGNOSIS — E041 Nontoxic single thyroid nodule: Secondary | ICD-10-CM | POA: Diagnosis not present

## 2022-03-25 DIAGNOSIS — Z79899 Other long term (current) drug therapy: Secondary | ICD-10-CM | POA: Diagnosis not present

## 2022-03-25 DIAGNOSIS — I700 Atherosclerosis of aorta: Secondary | ICD-10-CM | POA: Diagnosis not present

## 2022-03-25 DIAGNOSIS — Z880 Allergy status to penicillin: Secondary | ICD-10-CM | POA: Diagnosis not present

## 2022-03-25 DIAGNOSIS — C3490 Malignant neoplasm of unspecified part of unspecified bronchus or lung: Secondary | ICD-10-CM | POA: Diagnosis not present

## 2022-03-25 DIAGNOSIS — Z7902 Long term (current) use of antithrombotics/antiplatelets: Secondary | ICD-10-CM | POA: Diagnosis not present

## 2022-03-25 DIAGNOSIS — Z9049 Acquired absence of other specified parts of digestive tract: Secondary | ICD-10-CM | POA: Diagnosis not present

## 2022-03-25 DIAGNOSIS — I10 Essential (primary) hypertension: Secondary | ICD-10-CM | POA: Diagnosis not present

## 2022-03-25 DIAGNOSIS — K7689 Other specified diseases of liver: Secondary | ICD-10-CM | POA: Diagnosis not present

## 2022-03-25 DIAGNOSIS — Z91041 Radiographic dye allergy status: Secondary | ICD-10-CM | POA: Diagnosis not present

## 2022-03-25 DIAGNOSIS — C3431 Malignant neoplasm of lower lobe, right bronchus or lung: Secondary | ICD-10-CM | POA: Diagnosis not present

## 2022-03-25 DIAGNOSIS — I2510 Atherosclerotic heart disease of native coronary artery without angina pectoris: Secondary | ICD-10-CM | POA: Diagnosis not present

## 2022-03-25 DIAGNOSIS — Z888 Allergy status to other drugs, medicaments and biological substances status: Secondary | ICD-10-CM | POA: Diagnosis not present

## 2022-03-25 DIAGNOSIS — Z85828 Personal history of other malignant neoplasm of skin: Secondary | ICD-10-CM | POA: Diagnosis not present

## 2022-03-25 DIAGNOSIS — E785 Hyperlipidemia, unspecified: Secondary | ICD-10-CM | POA: Diagnosis not present

## 2022-03-25 NOTE — Progress Notes (Signed)
[facility] Telephone:([phone]) [phone]   Fax:([phone]) [phone]  OFFICE PROGRESS NOTE  Waunita Schooner, MD [place] [address]  DIAGNOSIS:  Stage IA (T1c, N0, M0) non-small cell lung cancer, moderately differentiated squamous cell carcinoma.  PRIOR THERAPY: status post right lower lobectomy with lymph node sampling on January 16, 2021 with a tumor size of 2.0 cm.  CURRENT THERAPY: Observation  INTERVAL HISTORY: Brandon [name] 66 y.o. male returns to the clinic today for 6 months follow-up visit.  The patient is feeling fine today with no concerning complaints.  He denied having any current weight loss or night sweats.  He has no chest pain, shortness of breath, cough or hemoptysis.  He has no nausea, vomiting, diarrhea or constipation.  He denied having any headache or visual changes.  He is here today for evaluation with repeat CT scan of the chest for restaging of his disease.  MEDICAL HISTORY: Past Medical History:  Diagnosis Date   Arthritis    Basal cell carcinoma    CAD (coronary artery disease) 06/14/2018   Cath on 01/23/2014 with LAD occlusion   Essential hypertension 06/08/2018   Gastroesophageal reflux disease 06/08/2018   GERD (gastroesophageal reflux disease)    Heart disease    Heart failure with reduced ejection fraction ([place]) 06/14/2018   ECHO 01/22/2014 with EF 40-45%   History of chicken pox    History of migraine    Hx of basal cell carcinoma 06/08/2018   04/2017. Left jaw   Hx of CABG    Hyperlipidemia    Inguinal hernia    Mixed hyperlipidemia 06/08/2018   Tobacco use disorder 06/08/2018    ALLERGIES:  is allergic to antihistamines, loratadine-type; penicillins; shellfish allergy; and iodine.  MEDICATIONS:  Current Outpatient Medications  Medication Sig Dispense Refill   Artificial Tear Ointment (DRY EYES OP) Place 1 drop into both eyes daily as needed (Dry eye).     aspirin EC 81 MG tablet Take 1 tablet (81 mg  total) by mouth daily. 90 tablet 3   clopidogrel (PLAVIX) 75 MG tablet TAKE 1 TABLET (75 MG TOTAL) BY MOUTH DAILY. 90 tablet 3   ezetimibe (ZETIA) 10 MG tablet Take 1 tablet (10 mg total) by mouth daily. Please contact the office to schedule appointment for additional refills. 90 tablet 2   fenofibrate (TRICOR) 145 MG tablet Take 1 tablet (145 mg total) by mouth daily. 90 tablet 3   metoprolol succinate (TOPROL-XL) 50 MG 24 hr tablet TAKE 1 TABLET BY MOUTH DAILY. TAKE WITH OR IMMEDIATELY FOLLOWING A MEAL. 90 tablet 3   Multiple Vitamin (MULTIVITAMIN) tablet Take 1 tablet by mouth daily.     nitroGLYCERIN (NITROSTAT) 0.4 MG SL tablet Place 1 tablet (0.4 mg total) under the tongue every 5 (five) minutes as needed for chest pain. 25 tablet 3   pantoprazole (PROTONIX) 20 MG tablet Take 1 tablet (20 mg total) by mouth daily. Please contact the office to schedule appointment for additional refills. 90 tablet 2   rosuvastatin (CRESTOR) 40 MG tablet TAKE 1 TABLET BY MOUTH EVERY DAY. Please contact the office to schedule appointment for additional refills. 90 tablet 2   sacubitril-valsartan (ENTRESTO) 49-51 MG Take 1 tablet by mouth 2 (two) times daily. 180 tablet 2   valACYclovir (VALTREX) 1000 MG tablet Take 1 tablet (1,000 mg total) by mouth 3 (three) times daily. 21 tablet 0   No current facility-administered medications for this visit.  SURGICAL HISTORY:  Past Surgical History:  Procedure Laterality Date   APPENDECTOMY  1960   BRONCHIAL BIOPSY  01/16/2021   Procedure: BRONCHIAL BIOPSIES;  Surgeon: Garner Nash, DO;  Location: [facility];  Service: Pulmonary;;   BRONCHIAL BRUSHINGS  01/16/2021   Procedure: BRONCHIAL BRUSHINGS;  Surgeon: Garner Nash, DO;  Location: [facility];  Service: Pulmonary;;   CORONARY ARTERY BYPASS GRAFT     x 3   FIDUCIAL MARKER PLACEMENT  01/16/2021   Procedure: FIDUCIAL DYE MARKING;  Surgeon: Garner Nash, DO;  Location: [facility];  Service:  Pulmonary;;   INGUINAL HERNIA REPAIR  2008   SKIN CANCER EXCISION     Stent placed     in 2005 and in 2014   [place] N/A 01/16/2021   Procedure: [place];  Surgeon: Garner Nash, DO;  Location: [facility];  Service: Pulmonary;  Laterality: N/A;  ION, ICG+MB Dye Marking   VIDEO BRONCHOSCOPY WITH RADIAL ENDOBRONCHIAL ULTRASOUND  01/16/2021   Procedure: VIDEO BRONCHOSCOPY WITH RADIAL ENDOBRONCHIAL ULTRASOUND;  Surgeon: Garner Nash, DO;  Location: [facility];  Service: Pulmonary;;    REVIEW OF SYSTEMS:  A comprehensive review of systems was negative.   PHYSICAL EXAMINATION: General appearance: alert, cooperative, and no distress Head: Normocephalic, without obvious abnormality, atraumatic Neck: no adenopathy, no JVD, supple, symmetrical, trachea midline, and thyroid not enlarged, symmetric, no tenderness/mass/nodules Lymph nodes: Cervical, supraclavicular, and axillary nodes normal. Resp: clear to auscultation bilaterally Back: symmetric, no curvature. ROM normal. No CVA tenderness. Cardio: regular rate and rhythm, S1, S2 normal, no murmur, click, rub or gallop GI: soft, non-tender; bowel sounds normal; no masses,  no organomegaly Extremities: extremities normal, atraumatic, no cyanosis or edema  ECOG PERFORMANCE STATUS: 0 - Asymptomatic  Blood pressure (!) 140/67, pulse 67, temperature 98.5 F (36.9 C), temperature source Oral, resp. rate 15, weight 213 lb 11.2 oz (96.9 kg), SpO2 99 %.  LABORATORY DATA: Lab Results  Component Value Date   WBC 7.3 03/23/2022   HGB 14.7 03/23/2022   HCT 43.1 03/23/2022   MCV 92.5 03/23/2022   PLT 229 03/23/2022      Chemistry      Component Value Date/Time   NA 139 03/23/2022 1435   NA 140 03/06/2022 0954   K 4.6 03/23/2022 1435   CL 103 03/23/2022 1435   CO2 29 03/23/2022 1435   BUN 20 03/23/2022 1435   BUN 18 03/06/2022 0954   CREATININE 1.21  03/23/2022 1435      Component Value Date/Time   CALCIUM 9.8 03/23/2022 1435   ALKPHOS 30 (L) 03/23/2022 1435   AST 15 03/23/2022 1435   ALT 15 03/23/2022 1435   BILITOT 0.6 03/23/2022 1435       RADIOGRAPHIC STUDIES: CT Chest Wo Contrast  Result Date: 03/24/2022 CLINICAL DATA:  Non-small cell lung cancer. Restaging. * Tracking Code: BO * EXAM: CT CHEST WITHOUT CONTRAST TECHNIQUE: Multidetector CT imaging of the chest was performed following the standard protocol without IV contrast. RADIATION DOSE REDUCTION: This exam was performed according to the departmental dose-optimization program which includes automated exposure control, adjustment of the mA and/or kV according to patient size and/or use of iterative reconstruction technique. COMPARISON:  09/19/2021 FINDINGS: Cardiovascular: Cardiac enlargement is stable. No pericardial effusion. Aortic atherosclerosis. Status post median sternotomy and CABG procedure. Mediastinum/Nodes: Left lobe of thyroid gland nodule is again noted. This measures 1.8 cm. This has been evaluated on previous imaging. (ref: J  Am Coll Radiol. 2015 Feb;12(2): 143-50). The trachea appears patent. Unremarkable appearance of the esophagus. No supraclavicular, axillary, hilar, or mediastinal adenopathy. Lungs/Pleura: Status post right lower lobectomy. No pleural fluid or airspace disease. No signs of locally recurrent tumor within the right lung. Posteromedial left lower lobe lung nodule is stable measuring 6 mm, image 85/5. Upper Abdomen: No acute abnormality. Unchanged cyst within left lobe of liver measuring 1.7 cm, image 135/2. Normal appearance of both adrenal glands. Musculoskeletal: No chest wall mass or suspicious bone lesions identified. IMPRESSION: 1. Stable CT of the chest status post right lower lobectomy. No signs of locally recurrent tumor or metastatic disease. 2. Stable 6 mm left lower lobe lung nodule. 3. Stable 1.8 cm left lobe of thyroid gland nodule. This has  been evaluated on previous imaging. 4.  Aortic Atherosclerosis (ICD10-I70.0). Electronically Signed   By: Kerby Moors M.D.   On: 03/24/2022 14:01    ASSESSMENT AND PLAN: This is a very pleasant 66 years old white male with stage IA (T1c, N0, M0) non-small cell lung cancer, moderately differentiated squamous cell carcinoma, status post right lower lobectomy with lymph node sampling on January 16, 2021 with a tumor size of 2.0 cm. The patient has been in observation since that time and he is feeling fine with no concerning complaints. He had repeat CT scan of the chest performed recently.  I personally and independently reviewed the scan and discussed the result with the patient today. His scan showed no concerning findings for disease recurrence. I recommended for him to continue on observation with repeat CT scan of the chest in 6 months. The patient was advised to call immediately if he has any other concerning symptoms in the interval. The patient voices understanding of current disease status and treatment options and is in agreement with the current care plan.  All questions were answered. The patient knows to call the clinic with any problems, questions or concerns. We can certainly see the patient much sooner if necessary.  The total time spent in the appointment was 20 minutes.  Disclaimer: This note was dictated with voice recognition software. Similar sounding words can inadvertently be transcribed and may not be corrected upon review.

## 2022-04-01 ENCOUNTER — Telehealth: Payer: Self-pay | Admitting: Interventional Cardiology

## 2022-04-01 NOTE — Telephone Encounter (Signed)
We have received a fax from [facility] that hgbA1C was drawn and labs will be faxed from [facility].  Results have not been received from [facility].  I spoke with [facility]--[phone] -option 2.  [facility] will fax labs to office. Patient notified that results have been requested from [facility].

## 2022-04-01 NOTE — Telephone Encounter (Signed)
Calling in bout his labs from 11/27. Please advise

## 2022-04-10 ENCOUNTER — Other Ambulatory Visit: Payer: Self-pay | Admitting: Nurse Practitioner

## 2022-04-10 DIAGNOSIS — Z951 Presence of aortocoronary bypass graft: Secondary | ICD-10-CM

## 2022-04-16 NOTE — Telephone Encounter (Signed)
I spoke with patient and reviewed A1C results with him.

## 2022-04-29 ENCOUNTER — Telehealth: Payer: Self-pay

## 2022-04-29 NOTE — Telephone Encounter (Signed)
**Note De-Identified Brandon [name] Obfuscation** The pt left the providers page of his NPAF application for Entresto at the office.  I called the pt to ask him what he wanted [place] to do with it once Dr Irish Lack signs it and he stated that he wants a call when he can pick it up as he wants to mail his application to NPAF himself.   I have completed the MD page of his NPAF application and have e-mailed ot t Dr Hassell Done nurse so she can obtain his signature, date it, place it in the front office and to call the pt to let him know when ready for pick up.

## 2022-05-04 NOTE — Telephone Encounter (Signed)
**Note De-Identified Brandon [name] Obfuscation** My e-mail with the providers page of the [place]ts [place] attached failed.  I have completed another [place] providers page with our DOD, Dr Randolm Idol information as Dr Eldridge Dace is out of the office this week and have e-mailed it to Dr Hoyle Barr nurse so she can obtain Dr Randolm Idol signature and date the application and to then fax it to [facility] and Me at the fax number written on the cover letter included.

## 2022-05-05 ENCOUNTER — Telehealth: Payer: Self-pay | Admitting: Interventional Cardiology

## 2022-05-05 NOTE — Telephone Encounter (Signed)
Patient is following-up on his assistance paperwork.

## 2022-05-05 NOTE — Telephone Encounter (Signed)
Signed prescriber page faxed to [phone].  Patient notified.  Patient requests we fax all paperwork to assistance program.  He was going to mail it but states he received letter that previous assistance has expired so he would like paperwork sent in as soon as possible.  He will drop paperwork off in the office today.  He requests we send signed prescriber page in again with rest of the paperwork. Signed prescriber paperwork left at front desk for patient to pick up.

## 2022-05-05 NOTE — Telephone Encounter (Signed)
**Note De-Identified Anastazja Isaac Obfuscation** I have faxed the pts NPAF application to NPAF successfully as noted below and I have notified that pt that I faxed all to NPAF today.: Fax: Tx 'ok' Report CONE_EMAIL-to-Fax  Akyah Lagrange, Elease Hashimoto  This message was sent Murray Guzzetta [facility], a product from [employer]. http://www.biscom.com/  [employer] pioneered [employer] and continues to innovate the most advanced and intelligent fax and secure messaging solutions for enterprises. www.biscom.com                     -------Fax Transmission Report-------  To:               Recipient at [phone] Subject:          NPAF/Entresto Assistance Result:           The transmission was successful. Explanation:      All Pages Ok Pages Sent:       5 Connect Time:     2 minutes, 28 seconds Transmit Time:    05/05/2022 10:42 Transfer Rate:    14400 Status Code:      0000 Retry Count:      0 Job Id:           [id number] Unique Id:        [id number] Fax Line:         27 Fax Server:       [id number]

## 2022-05-05 NOTE — Telephone Encounter (Signed)
**Note De-Identified Kashish Yglesias Obfuscation** Per phone note in the pts chart from this morning he pt is aware of the following:  Reinaldo Meeker  05/05/22  8:38 AM Note Signed prescriber page faxed to [phone].  Patient notified.  Patient requests we fax all paperwork to assistance program.  He was going to mail it but states he received letter that previous assistance has expired so he would like paperwork sent in as soon as possible.  He will drop paperwork off in the office today.  He requests we send signed prescriber page in again with rest of the paperwork. Signed prescriber paperwork left at front desk for patient to pick up.

## 2022-05-08 ENCOUNTER — Ambulatory Visit: Payer: Medicare HMO

## 2022-05-12 ENCOUNTER — Ambulatory Visit (INDEPENDENT_AMBULATORY_CARE_PROVIDER_SITE_OTHER): Payer: Medicare HMO | Admitting: Family Medicine

## 2022-05-12 ENCOUNTER — Encounter: Payer: Self-pay | Admitting: Family Medicine

## 2022-05-12 VITALS — BP 136/72 | HR 67 | Temp 97.7°F | Resp 16 | Ht 68.0 in | Wt 218.2 lb

## 2022-05-12 DIAGNOSIS — L309 Dermatitis, unspecified: Secondary | ICD-10-CM | POA: Diagnosis not present

## 2022-05-12 DIAGNOSIS — I10 Essential (primary) hypertension: Secondary | ICD-10-CM

## 2022-05-12 DIAGNOSIS — R7303 Prediabetes: Secondary | ICD-10-CM | POA: Diagnosis not present

## 2022-05-12 DIAGNOSIS — I5020 Unspecified systolic (congestive) heart failure: Secondary | ICD-10-CM | POA: Diagnosis not present

## 2022-05-12 DIAGNOSIS — C3431 Malignant neoplasm of lower lobe, right bronchus or lung: Secondary | ICD-10-CM

## 2022-05-12 DIAGNOSIS — Z85828 Personal history of other malignant neoplasm of skin: Secondary | ICD-10-CM

## 2022-05-12 DIAGNOSIS — Z7902 Long term (current) use of antithrombotics/antiplatelets: Secondary | ICD-10-CM | POA: Diagnosis not present

## 2022-05-12 DIAGNOSIS — E782 Mixed hyperlipidemia: Secondary | ICD-10-CM | POA: Diagnosis not present

## 2022-05-12 DIAGNOSIS — K219 Gastro-esophageal reflux disease without esophagitis: Secondary | ICD-10-CM

## 2022-05-12 DIAGNOSIS — Z125 Encounter for screening for malignant neoplasm of prostate: Secondary | ICD-10-CM

## 2022-05-12 LAB — PSA, MEDICARE: PSA: 2.95 ng/ml (ref 0.10–4.00)

## 2022-05-12 NOTE — Assessment & Plan Note (Signed)
A1C 6.4 on [facility] labs.   Trying to get back to exercise and working on weight loss.

## 2022-05-12 NOTE — Assessment & Plan Note (Addendum)
Well-controlled at last check on Zetia 10 mg daily and Crestor 40 mg daily,  Tricor 45 mg p.o. daily  LDL goal < 70 give CAD

## 2022-05-12 NOTE — Assessment & Plan Note (Addendum)
Chronic, stable control on pantoprazole 20 mg daily  Does cause loose stools.

## 2022-05-12 NOTE — Assessment & Plan Note (Signed)
Echo October 2015 EF 40 to 45%, repeat echo is similar in December 2020. Followed by Dr. Eldridge Dace, cardiology..  Reviewed last office visit from July 2023 Euvolemic in office today  On Entresto 49/51 mg twice daily

## 2022-05-12 NOTE — Progress Notes (Signed)
Patient ID: Brandon [name], male    DOB: Nov 12, 1955, 67 y.o.   MRN: [id number]  This visit was conducted in person.  BP 136/72   Pulse 67   Temp 97.7 F (36.5 C)   Resp 16   Ht 5\' 8"  (1.727 m)   Wt 218 lb 4 oz (99 kg)   SpO2 98%   BMI 33.18 kg/m    CC:  Chief Complaint  Patient presents with   Establish Care    Transferring from Dr. Einar [name]    Subjective:   HPI: Brandon [name] is a 67 y.o. male presenting on 05/12/2022 for [place] (Transferring from Dr. Einar [name])  Previous PCP: Brandon [name]  LAST AMW/CPX: None on records..  Overdue for multiple preventative screening health maintenance. He has not had PSA in a long time. Has schedule colonoscopy. Former smoker... quit in last 2 years,  vaping Nml Abdominal US, no AA in 2023.     Reviewed last OV with Dr. Julien [name] for 6 month follow up lung cancer status post right lower lobectomy with lymph node sampling on January 16, 2021 with a tumor size of 2.0 cm.   No chemo or radiation.   Hypertension:  Well-controlled on metoprolol XL and Entresto BP Readings from Last 3 Encounters:  05/12/22 136/72  03/25/22 (!) 140/67  11/18/21 125/74  Using medication without problems or lightheadedness: none Chest pain with exertion: none Edema:none Short of breath:none Average home BPs: Other issues:  Elevated Cholesterol: Well-controlled at last check on Zetia 10 mg daily and Crestor 40 mg daily Lab Results  Component Value Date   CHOL 140 03/06/2022   HDL 40 03/06/2022   LDLCALC 64 03/06/2022   TRIG 220 (H) 03/06/2022   CHOLHDL 3.5 03/06/2022  Using medications without problems: Muscle aches:  Diet compliance: moderate Exercise: walking 3-4 days a week  Other complaints:  CAD, s/P CABG and additional stent Congestive heart failure with reduced ejection fraction.  Echo October 2015 EF 40 to 45%, repeat echo is similar in December 2020. n 2000, angina was back pain but then went to this left arm.  He had an MI at that time and  later had CABG, LIMA and 2 radial arteries.   CAD history: CABG x 3 in 2000, [place] , [place]. [facility] 2005: Stent at [facility]  Angina felt like intermittent back pain.  2015: Stent at [facility], 2.25 x 16 Promus in Radial to RCA.  He has been on Plavix in the past. His PMD tried to stop his Plavix, but he preferred to stay on it. Followed by Dr. Irish Lack, cardiology..  Reviewed last office visit from July 2023    Prediabetes.Marland Kitchen dx with recent A1C 03/2022.. A1C 6.4 Relevant past medical, surgical, family and social history reviewed and updated as indicated. Interim medical history since our last visit reviewed. Allergies and medications reviewed and updated. Outpatient Medications Prior to Visit  Medication Sig Dispense Refill   aspirin EC 81 MG tablet Take 1 tablet (81 mg total) by mouth daily. 90 tablet 3   clopidogrel (PLAVIX) 75 MG tablet TAKE 1 TABLET BY MOUTH EVERY DAY 90 tablet 3   ezetimibe (ZETIA) 10 MG tablet Take 1 tablet (10 mg total) by mouth daily. Please contact the office to schedule appointment for additional refills. 90 tablet 2   fenofibrate (TRICOR) 145 MG tablet Take 1 tablet (145 mg total) by mouth daily. 90 tablet 3   metoprolol succinate (TOPROL-XL) 50 MG 24 hr tablet TAKE 1 TABLET  BY MOUTH DAILY. TAKE WITH OR IMMEDIATELY FOLLOWING A MEAL. 90 tablet 3   Multiple Vitamin (MULTIVITAMIN) tablet Take 1 tablet by mouth daily.     nitroGLYCERIN (NITROSTAT) 0.4 MG SL tablet Place 1 tablet (0.4 mg total) under the tongue every 5 (five) minutes as needed for chest pain. 25 tablet 3   pantoprazole (PROTONIX) 20 MG tablet Take 1 tablet (20 mg total) by mouth daily. Please contact the office to schedule appointment for additional refills. 90 tablet 2   rosuvastatin (CRESTOR) 40 MG tablet TAKE 1 TABLET BY MOUTH EVERY DAY. Please contact the office to schedule appointment for additional refills. 90 tablet 2   sacubitril-valsartan (ENTRESTO) 49-51 MG Take 1 tablet by mouth 2  (two) times daily. 180 tablet 2   Artificial Tear Ointment (DRY EYES OP) Place 1 drop into both eyes daily as needed (Dry eye).     valACYclovir (VALTREX) 1000 MG tablet Take 1 tablet (1,000 mg total) by mouth 3 (three) times daily. 21 tablet 0   No facility-administered medications prior to visit.     Per HPI unless specifically indicated in ROS section below Review of Systems  Constitutional:  Negative for fatigue and fever.  HENT:  Negative for ear pain.   Eyes:  Negative for pain.  Respiratory:  Negative for cough and shortness of breath.   Cardiovascular:  Negative for chest pain, palpitations and leg swelling.  Gastrointestinal:  Negative for abdominal pain.  Genitourinary:  Negative for dysuria.  Musculoskeletal:  Negative for arthralgias.  Neurological:  Negative for syncope, light-headedness and headaches.  Psychiatric/Behavioral:  Negative for dysphoric mood.    Objective:  BP 136/72   Pulse 67   Temp 97.7 F (36.5 C)   Resp 16   Ht 5\' 8"  (1.727 m)   Wt 218 lb 4 oz (99 kg)   SpO2 98%   BMI 33.18 kg/m   Wt Readings from Last 3 Encounters:  05/12/22 218 lb 4 oz (99 kg)  03/25/22 213 lb 11.2 oz (96.9 kg)  10/22/21 212 lb (96.2 kg)      Physical Exam Constitutional:      Appearance: He is well-developed.  HENT:     Head: Normocephalic.     Right Ear: Hearing normal.     Left Ear: Hearing normal.     Nose: Nose normal.  Neck:     Thyroid: No thyroid mass or thyromegaly.     Vascular: No carotid bruit.     Trachea: Trachea normal.  Cardiovascular:     Rate and Rhythm: Normal rate and regular rhythm.     Pulses: Normal pulses.     Heart sounds: Heart sounds not distant. No murmur heard.    No friction rub. No gallop.     Comments: No peripheral edema Pulmonary:     Effort: Pulmonary effort is normal. No respiratory distress.     Breath sounds: Normal breath sounds.  Skin:    General: Skin is warm and dry.     Findings: No rash.  Psychiatric:         Speech: Speech normal.        Behavior: Behavior normal.        Thought Content: Thought content normal.       Results for orders placed or performed in visit on 03/23/22  CMP ([place] only)  Result Value Ref Range   Sodium 139 135 - 145 mmol/L   Potassium 4.6 3.5 - 5.1 mmol/L   Chloride 103  98 - 111 mmol/L   CO2 29 22 - 32 mmol/L   Glucose, Bld 82 70 - 99 mg/dL   BUN 20 8 - 23 mg/dL   Creatinine 1.51 5.82 - 1.24 mg/dL   Calcium 9.8 8.9 - 65.8 mg/dL   Total Protein 6.9 6.5 - 8.1 g/dL   Albumin 4.6 3.5 - 5.0 g/dL   AST 15 15 - 41 U/L   ALT 15 0 - 44 U/L   Alkaline Phosphatase 30 (L) 38 - 126 U/L   Total Bilirubin 0.6 0.3 - 1.2 mg/dL   GFR, Estimated >71 >84 mL/min   Anion gap 7 5 - 15  CBC with Differential (Cancer Center Only)  Result Value Ref Range   WBC Count 7.3 4.0 - 10.5 K/uL   RBC 4.66 4.22 - 5.81 MIL/uL   Hemoglobin 14.7 13.0 - 17.0 g/dL   HCT 10.8 57.9 - 07.9 %   MCV 92.5 80.0 - 100.0 fL   MCH 31.5 26.0 - 34.0 pg   MCHC 34.1 30.0 - 36.0 g/dL   RDW 31.0 91.4 - 56.0 %   Platelet Count 229 150 - 400 K/uL   nRBC 0.0 0.0 - 0.2 %   Neutrophils Relative % 54 %   Neutro Abs 3.9 1.7 - 7.7 K/uL   Lymphocytes Relative 33 %   Lymphs Abs 2.4 0.7 - 4.0 K/uL   Monocytes Relative 10 %   Monocytes Absolute 0.7 0.1 - 1.0 K/uL   Eosinophils Relative 2 %   Eosinophils Absolute 0.2 0.0 - 0.5 K/uL   Basophils Relative 1 %   Basophils Absolute 0.0 0.0 - 0.1 K/uL   Immature Granulocytes 0 %   Abs Immature Granulocytes 0.01 0.00 - 0.07 K/uL    Assessment and Plan  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    Essential hypertension Assessment & Plan: Stable, chronic.  Continue current medication.   Metoprolol XL 50 mg daily  Entresto daily   Mixed hyperlipidemia Assessment &  Plan: Well-controlled at last check on Zetia 10 mg daily and Crestor 40 mg daily,  Tricor 45 mg p.o. daily  LDL goal < 70 give CAD   Heart failure with reduced ejection fraction [facility]) Assessment & Plan: Echo October 2015 EF 40 to 45%, repeat echo is similar in December 2020. Followed by Dr. Eldridge Dace, cardiology..  Reviewed last office visit from July 2023 Euvolemic in office today  On Entresto 49/51 mg twice daily   Gastroesophageal reflux disease, unspecified whether esophagitis present Assessment & Plan: Chronic, stable control on pantoprazole 20 mg daily  Does cause loose stools.   Dermatitis Assessment & Plan: Chronic, intermittent occurring in winter.   Antiplatelet or antithrombotic long-term use Assessment & Plan: On plavix history of CABG/ stent   Prostate cancer screening -     PSA, Medicare  Hx of basal cell carcinoma Assessment & Plan:  Followed by Dr. Yetta Barre, dermatology.   Prediabetes Assessment & Plan:  A1C 6.4 on [facility] labs.   Trying to get back to exercise and working on weight loss.     Return in about 6 months (around 11/10/2022) for phone AMW,  then 20 min CPE with me.   Kerby Nora, MD

## 2022-05-12 NOTE — Assessment & Plan Note (Signed)
Chronic, intermittent occurring in winter.

## 2022-05-12 NOTE — Patient Instructions (Addendum)
Can try Pepcid AC  twice daily to try to reduce dose of pantoprazole  to as needed.  Please stop at the lab to have labs drawn.

## 2022-05-12 NOTE — Assessment & Plan Note (Addendum)
Stable, chronic.  Continue current medication.   Metoprolol XL 50 mg daily  Entresto daily

## 2022-05-12 NOTE — Assessment & Plan Note (Signed)
Followed by Onc , Dr Arbutus Ped status post right lower lobectomy with lymph node sampling on January 16, 2021 with a tumor size of 2.0 cm.

## 2022-05-12 NOTE — Assessment & Plan Note (Signed)
Followed by Dr. Yetta Barre, dermatology.

## 2022-05-12 NOTE — Assessment & Plan Note (Signed)
On plavix history of CABG/ stent

## 2022-05-14 NOTE — Telephone Encounter (Signed)
**Note De-Identified Brandon [name] Obfuscation** Letter received from [employer] stating that they have approved the pt for Entresto assistance until 04/20/2023. Pt ID: [id number] The letter states that they have also notified the pt of this approval.

## 2022-05-15 DIAGNOSIS — H52209 Unspecified astigmatism, unspecified eye: Secondary | ICD-10-CM | POA: Diagnosis not present

## 2022-05-15 DIAGNOSIS — H5203 Hypermetropia, bilateral: Secondary | ICD-10-CM | POA: Diagnosis not present

## 2022-05-15 DIAGNOSIS — H524 Presbyopia: Secondary | ICD-10-CM | POA: Diagnosis not present

## 2022-06-03 ENCOUNTER — Telehealth: Payer: Self-pay

## 2022-06-03 ENCOUNTER — Encounter: Payer: Self-pay | Admitting: Gastroenterology

## 2022-06-03 ENCOUNTER — Ambulatory Visit: Payer: Medicare HMO | Admitting: Gastroenterology

## 2022-06-03 VITALS — BP 107/62 | HR 69 | Ht 68.0 in | Wt 213.0 lb

## 2022-06-03 DIAGNOSIS — Z951 Presence of aortocoronary bypass graft: Secondary | ICD-10-CM

## 2022-06-03 DIAGNOSIS — Z800 Family history of malignant neoplasm of digestive organs: Secondary | ICD-10-CM

## 2022-06-03 DIAGNOSIS — Z86010 Personal history of colonic polyps: Secondary | ICD-10-CM | POA: Diagnosis not present

## 2022-06-03 DIAGNOSIS — I25118 Atherosclerotic heart disease of native coronary artery with other forms of angina pectoris: Secondary | ICD-10-CM | POA: Diagnosis not present

## 2022-06-03 DIAGNOSIS — K219 Gastro-esophageal reflux disease without esophagitis: Secondary | ICD-10-CM | POA: Diagnosis not present

## 2022-06-03 DIAGNOSIS — I5020 Unspecified systolic (congestive) heart failure: Secondary | ICD-10-CM | POA: Diagnosis not present

## 2022-06-03 DIAGNOSIS — Z7902 Long term (current) use of antithrombotics/antiplatelets: Secondary | ICD-10-CM

## 2022-06-03 NOTE — Progress Notes (Unsigned)
Chief Complaint: Colon polyp surveillance, family history of colon cancer, medication management    HPI:     Brandon [name] is a 67 y.o. male with history of CAD (on Plavix) remote CABG and stents, HTN, BCC, HLD, prediabetes, CHF with reduced EF (EF 40-45%), GERD, tobacco use, lung squamous cell carcinoma diagnosed 12/2020 s/p right lower lobectomy with lymph node sampling 12/2020, presenting [facility] for evaluation of repeat colonoscopy.  Family history notable for father with colon cancer, > age 41.   Last colonoscopy was 06/2020 and notable for >10 adenomas as below.  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  Separately, he has a longstanding hx of GERD for many years, with index sxs of NCCP, chest pressure, belching.  Sxs well controlled after starting Protonix 20 mg daily. However, he developed 5-6 loose stools, so backed down to QOD and tagamet or Pepto on the days w/o PPI. Still with loose stools, but improved. No hematochezia, melena. Changed ~3 weeks ago, now mainly Tagamet BID, Pepto rarely for breakthrough, and takes Protonix every 3-4 days now.   No prior EGD. No dysphagia.    Endoscopic History: -07/12/2020: Colonoscopy: 10 polyps in the cecum through the descending colon ranging 3-12 mm (path: Tubular adenomas), 2 sigmoid polyps (path: TA x 1), sigmoid diverticulosis, internal hemorrhoids and hypertrophied anal papilla.  Normal TI.  Since he had >10 adenomas, recommended repeat in 1 year and referral to the [facility] 03/23/2022    2:35 PM 09/19/2021    2:42 PM 03/25/2021    1:54 PM  CBC  WBC 4.0 - 10.5 K/uL 7.3  7.7  7.3   Hemoglobin 13.0 - 17.0 g/dL 14.7  14.4  13.9   Hematocrit 39.0 - 52.0 % 43.1  43.0  42.4   Platelets 150 - 400 K/uL 229  229  241       Latest Ref Rng & Units 03/23/2022    2:35 PM 03/06/2022    9:54 AM 09/19/2021     2:42 PM  CMP  Glucose 70 - 99 mg/dL 82  130  91   BUN 8 - 23 mg/dL 20  18  18    Creatinine 0.61 - 1.24 mg/dL 1.21  1.24  1.33   Sodium 135 - 145 mmol/L 139  140  141   Potassium 3.5 - 5.1 mmol/L 4.6  4.4  4.4   Chloride 98 - 111 mmol/L 103  99  105   CO2 22 - 32 mmol/L 29  26  27    Calcium 8.9 - 10.3 mg/dL 9.8  10.1  9.3   Total Protein 6.5 - 8.1 g/dL 6.9  6.6  6.9   Total Bilirubin 0.3 - 1.2 mg/dL 0.6  0.7  0.6   Alkaline Phos 38 - 126 U/L 30  39  33   AST 15 - 41 U/L 15  17  20    ALT 0 - 44 U/L 15  14  18       Past Medical History:  Diagnosis Date   Arthritis    Basal cell carcinoma    CAD (coronary artery disease) 06/14/2018   Cath on 01/23/2014 with LAD occlusion   Essential hypertension 06/08/2018   Gastroesophageal reflux disease 06/08/2018   GERD (gastroesophageal reflux disease)    Heart disease  Heart failure with reduced ejection fraction ([place]) 06/14/2018   ECHO 01/22/2014 with EF 40-45%   History of chicken pox    History of migraine    Hx of basal cell carcinoma 06/08/2018   04/2017. Left jaw   Hx of CABG    Hyperlipidemia    Inguinal hernia    Mixed hyperlipidemia 06/08/2018   Tobacco use disorder 06/08/2018     Past Surgical History:  Procedure Laterality Date   APPENDECTOMY  1960   BRONCHIAL BIOPSY  01/16/2021   Procedure: BRONCHIAL BIOPSIES;  Surgeon: Garner Nash, DO;  Location: [facility];  Service: Pulmonary;;   BRONCHIAL BRUSHINGS  01/16/2021   Procedure: BRONCHIAL BRUSHINGS;  Surgeon: Garner Nash, DO;  Location: [facility];  Service: Pulmonary;;   CORONARY ARTERY BYPASS GRAFT     x 3   FIDUCIAL MARKER PLACEMENT  01/16/2021   Procedure: FIDUCIAL DYE MARKING;  Surgeon: Garner Nash, DO;  Location: [facility];  Service: Pulmonary;;   INGUINAL HERNIA REPAIR  2008   SKIN CANCER EXCISION     Stent placed     in 2005 and in 2014   [place] N/A 01/16/2021   Procedure: [place];  Surgeon: Garner Nash, DO;  Location: [facility];  Service: Pulmonary;  Laterality: N/A;  ION, ICG+MB Dye Marking   VIDEO BRONCHOSCOPY WITH RADIAL ENDOBRONCHIAL ULTRASOUND  01/16/2021   Procedure: VIDEO BRONCHOSCOPY WITH RADIAL ENDOBRONCHIAL ULTRASOUND;  Surgeon: Garner Nash, DO;  Location: [facility];  Service: Pulmonary;;   Family History  Problem Relation Age of Onset   Dementia Mother    Heart attack Mother 56   Heart disease Mother    Colon cancer Father 37   Diabetes Father    Colon polyps Sister    Heart attack Brother 23   Heart attack Maternal Grandmother    Heart attack Maternal Grandfather    Stroke Paternal Grandmother 75   Prostate cancer Paternal Grandfather    Social History   Tobacco Use   Smoking status: Former    Packs/day: 1.50    Years: 40.00    Total pack years: 60.00    Types: Cigarettes    Start date: 45    Quit date: 04/21/2019    Years since quitting: 3.1   Smokeless tobacco: Former    Types: Chew    Quit date: 04/21/2003  Vaping Use   Vaping Use: Every day   Substances: Nicotine  Substance Use Topics   Alcohol use: Yes    Comment: 2 to 3 beers daily   Drug use: Yes    Types: Marijuana    Comment: once a week   Current Outpatient Medications  Medication Sig Dispense Refill   aspirin EC 81 MG tablet Take 1 tablet (81 mg total) by mouth daily. 90 tablet 3   clopidogrel (PLAVIX) 75 MG tablet TAKE 1 TABLET BY MOUTH EVERY DAY 90 tablet 3   ezetimibe (ZETIA) 10 MG tablet Take 1 tablet (10 mg total) by mouth daily. Please contact the office to schedule appointment for additional refills. 90 tablet 2   fenofibrate (TRICOR) 145 MG tablet Take 1 tablet (145 mg total) by mouth daily. 90 tablet 3   metoprolol succinate (TOPROL-XL) 50 MG 24 hr tablet TAKE 1 TABLET BY MOUTH DAILY. TAKE WITH OR IMMEDIATELY FOLLOWING A MEAL. 90 tablet 3   Multiple Vitamin (MULTIVITAMIN) tablet Take 1 tablet by mouth daily.      pantoprazole (  PROTONIX) 20 MG tablet Take 1 tablet (20 mg total) by mouth daily. Please contact the office to schedule appointment for additional refills. 90 tablet 2   rosuvastatin (CRESTOR) 40 MG tablet TAKE 1 TABLET BY MOUTH EVERY DAY. Please contact the office to schedule appointment for additional refills. 90 tablet 2   sacubitril-valsartan (ENTRESTO) 49-51 MG Take 1 tablet by mouth 2 (two) times daily. 180 tablet 2   Artificial Tear Ointment (DRY EYES OP) Place 1 drop into both eyes daily as needed (Dry eye). (Patient not taking: Reported on 06/03/2022)     nitroGLYCERIN (NITROSTAT) 0.4 MG SL tablet Place 1 tablet (0.4 mg total) under the tongue every 5 (five) minutes as needed for chest pain. (Patient not taking: Reported on 06/03/2022) 25 tablet 3   valACYclovir (VALTREX) 1000 MG tablet Take 1 tablet (1,000 mg total) by mouth 3 (three) times daily. (Patient not taking: Reported on 06/03/2022) 21 tablet 0   No current facility-administered medications for this visit.   Allergies  Allergen Reactions   Antihistamines, Loratadine-Type Hives    Any kind of antihistamines-hallucination, swelling up, itching, rash   Penicillins Other (See Comments)    Throat swelling up, hallucinations.   Shellfish Allergy Swelling    More as a kid, and now mild reaction-itchy eyes and nose   Iodine Nausea And Vomiting    Really nauseas and vomited.     Review of Systems: All systems reviewed and negative except where noted in HPI.     Physical Exam:    Wt Readings from Last 3 Encounters:  06/03/22 213 lb (96.6 kg)  05/12/22 218 lb 4 oz (99 kg)  03/25/22 213 lb 11.2 oz (96.9 kg)    BP 107/62   Pulse 69   Ht 5\' 8"  (1.727 m)   Wt 213 lb (96.6 kg)   BMI 32.39 kg/m  Constitutional:  Pleasant, in no acute distress. Psychiatric: Normal mood and affect. Behavior is normal. Cardiovascular: Normal rate, regular rhythm. No edema Pulmonary/chest: Effort normal and breath sounds normal. No wheezing,  rales or rhonchi. Abdominal: Soft, nondistended, nontender. Bowel sounds active throughout. There are no masses palpable. No hepatomegaly. Neurological: Alert and oriented to person place and time. Skin: Skin is warm and dry. No rashes noted.   ASSESSMENT AND PLAN;   1) History of colon polyps 2) Family history of colon cancer - Due for repeat colonoscopy for ongoing polyp surveillance - Again discussed possible referral to the [facility] depending on repeat colonoscopy findings (index colonoscopy with >10 adenomas) - Schedule colonoscopy - Provided with Clenpiq sample today  3) GERD Longstanding history of reflux with recent breakthrough symptoms. - EGD to evaluate for erosive esophagitis, LES laxity, hiatal hernia - Based on current guidelines, meets screening criteria for Barrett's Esophagus as well - Continue Tagamet as currently doing, with plan for change in therapy depending on endoscopic findings and response to therapy  4) CAD/History of CABG 5) CHF 6) Chronic antiplatelet therapy - Hold Plavix 5 days before procedure - will instruct when and how to resume after procedure. Low but real risk of cardiovascular event such as heart attack, stroke, embolism, thrombosis or ischemia/infarct of other organs off Plavix explained and need to seek urgent help if this occurs. The patient consents to proceed. Will communicate by phone or EMR with patient's prescribing provider to confirm that holding Plavix is reasonable in this case   The indications, risks, and benefits of EGD and colonoscopy were explained to the patient in detail.  Risks include but are not limited to bleeding, perforation, adverse reaction to medications, and cardiopulmonary compromise. Sequelae include but are not limited to the possibility of surgery, hospitalization, and mortality. The patient verbalized understanding and wished to proceed. All questions answered, referred to scheduler and bowel prep ordered.  Further recommendations pending results of the exam.     Lavena Bullion, DO, FACG  06/03/2022, 2:58 PM   Waunita Schooner, MD

## 2022-06-03 NOTE — Telephone Encounter (Signed)
   Patient Name: Brandon [name]  DOB: 1955-11-15 MRN: [id number]  Primary Cardiologist: Larae Grooms, MD  Chart reviewed as part of pre-operative protocol coverage.   Per Dr. Irish Lack, patient may hold Plavix 5 days prior to procedure and resume when ok from a bleeding standpoint per surgical team.   Regarding ASA therapy, we recommend continuation of ASA throughout the perioperative period.  However, if the surgeon feels that cessation of ASA is required in the perioperative period, it may be stopped 5-7 days prior to surgery with a plan to resume it as soon as felt to be feasible from a surgical standpoint in the post-operative period.   I will route this recommendation to the requesting party via Epic fax function and remove from pre-op pool.  Please call with questions.  Mable Fill, Marissa Nestle, NP 06/03/2022, 3:58 PM

## 2022-06-03 NOTE — Telephone Encounter (Signed)
[facility] Pre-operative Risk Assessment     Request for surgical clearance:     Endoscopy Procedure  What type of surgery is being performed?  Colonoscopy and EGD  When is this surgery scheduled?  07/07/22  What type of clearance is required ?  Pharmacy  Are there any medications that need to be held prior to surgery and how long? Plavix for 5 days  Practice name and name of physician performing surgery?  Dr. Bryan Lemma,   [facility]  What is your office phone and fax number?      Phone- [phone]  [phone]  Anesthesia type (None, local, MAC, general) ?       MAC

## 2022-06-03 NOTE — Telephone Encounter (Signed)
Patient made aware to hold Plavix 5 days prior his procedure. Patient verbalized understanding.

## 2022-06-03 NOTE — Patient Instructions (Addendum)
We have given you a sample of Clenpiq.  You will be contaced by our office prior to your procedure for directions on holding your Plavix.  If you do not hear from our office 1 week prior to your scheduled procedure, please call [phone] to discuss.   You have been scheduled for an endoscopy and colonoscopy. Please follow the written instructions given to you at your visit today. Please pick up your prep supplies at the pharmacy within the next 1-3 days. If you use inhalers (even only as needed), please bring them with you on the day of your procedure.    _______________________________________________________  If your blood pressure at your visit was 140/90 or greater, please contact your primary care physician to follow up on this.  _______________________________________________________  If you are age 5 or older, your body mass index should be between 23-30. Your Body mass index is 32.39 kg/m. If this is out of the aforementioned range listed, please consider follow up with your Primary Care Provider.  If you are age 6 or younger, your body mass index should be between 19-25. Your Body mass index is 32.39 kg/m. If this is out of the aformentioned range listed, please consider follow up with your Primary Care Provider.   __________________________________________________________  The [facility] providers would like to encourage you to use [facility] to communicate with providers for non-urgent requests or questions.  Due to long hold times on the telephone, sending your provider a message by [facility] may be a faster and more efficient way to get a response.  Please allow 48 business hours for a response.  Please remember that this is for non-urgent requests.   Due to recent changes in healthcare laws, you may see the results of your imaging and laboratory studies on MyChart before your provider has had a chance to review them.  We understand that in some cases there may be results that  are confusing or concerning to you. Not all laboratory results come back in the same time frame and the provider may be waiting for multiple results in order to interpret others.  Please give [place] 48 hours in order for your provider to thoroughly review all the results before contacting the office for clarification of your results.    Thank you for choosing me and [facility].  Vito Cirigliano, D.O.

## 2022-06-29 ENCOUNTER — Encounter: Payer: Self-pay | Admitting: Gastroenterology

## 2022-07-06 ENCOUNTER — Telehealth: Payer: Self-pay | Admitting: Gastroenterology

## 2022-07-06 NOTE — Telephone Encounter (Signed)
Inbound call from patient, states he called the answering service over the weekend to cancel his procedure scheduled for tomorrow. Patient states he was made aware his uncle was very sick and has traveled out of town to care for him. He is requesting his cancellation fee to be waived, states it was out of his control. Patient rescheduled procedure for 5/14 at 10:30 AM.

## 2022-07-07 ENCOUNTER — Encounter: Payer: Medicare HMO | Admitting: Gastroenterology

## 2022-07-09 ENCOUNTER — Telehealth: Payer: Self-pay | Admitting: Family Medicine

## 2022-07-09 NOTE — Telephone Encounter (Signed)
Contacted Brandon [name] to schedule their annual wellness visit. Patient declined to schedule AWV at this time.  [place] Direct Dial: [phone]

## 2022-07-31 ENCOUNTER — Other Ambulatory Visit: Payer: Self-pay | Admitting: Family Medicine

## 2022-07-31 ENCOUNTER — Encounter: Payer: Self-pay | Admitting: Family Medicine

## 2022-07-31 ENCOUNTER — Ambulatory Visit (INDEPENDENT_AMBULATORY_CARE_PROVIDER_SITE_OTHER): Payer: Medicare HMO | Admitting: Family Medicine

## 2022-07-31 VITALS — BP 132/82 | HR 66 | Temp 99.4°F | Ht 68.0 in | Wt 210.4 lb

## 2022-07-31 DIAGNOSIS — U071 COVID-19: Secondary | ICD-10-CM | POA: Insufficient documentation

## 2022-07-31 DIAGNOSIS — R0981 Nasal congestion: Secondary | ICD-10-CM

## 2022-07-31 LAB — POC COVID19 BINAXNOW: SARS Coronavirus 2 Ag: POSITIVE — AB

## 2022-07-31 MED ORDER — MOLNUPIRAVIR EUA 200MG CAPSULE: 4.0000 | ORAL_CAPSULE | Freq: Two times a day (BID) | ORAL | 0 refills | Status: AC

## 2022-07-31 NOTE — Progress Notes (Signed)
Patient ID: Brandon [name], male    DOB: 08-09-1955, 67 y.o.   MRN: [id number]  This visit was conducted in person.  BP 132/82 (BP Location: Left Arm)   Pulse 66   Temp 99.4 F (37.4 C) (Temporal)   Ht  (1.727 m)   Wt 210 lb 6.4 oz (95.4 kg)   SpO2 98%   BMI 31.99 kg/m    CC:  Chief Complaint  Patient presents with   Nasal Congestion    With runny nose and cough. Covid home test was pink, but showed expired.    Subjective:   HPI: Makel Mcmann is a 67 y.o. male with history of CAD, HFrEF, prediabets presenting on 07/31/2022 for Nasal Congestion (With runny nose and cough. Covid home test was pink, but showed expired.)   Date of onset:  11 hoiurs ago Initial symptoms included  nasal congestion, mild sore throat Symptoms progressed to chills, runny nose.  No ear pain, no cough   No new SOB, no wheeze  [place]ick [place]:  [place] COVID testing:   positive     He has tried to treat with  tylenol     No history of chronic lung disease such as asthma or COPD.  History of lobectomy of lung for lung cancer.  Former  heavy smoker.      Relevant past medical, surgical, family and social history reviewed and updated as indicated. Interim medical history since our last visit reviewed. Allergies and medications reviewed and updated. Outpatient Medications Prior to Visit  Medication Sig Dispense Refill   aspirin EC 81 MG tablet Take 1 tablet (81 mg total) by mouth daily. 90 tablet 3   clopidogrel (PLAVIX) 75 MG tablet TAKE 1 TABLET BY MOUTH EVERY DAY 90 tablet 3   ezetimibe (ZETIA) 10 MG tablet Take 1 tablet (10 mg total) by mouth daily. Please contact the office to schedule appointment for additional refills. 90 tablet 2   fenofibrate (TRICOR) 145 MG tablet Take 1 tablet (145 mg total) by mouth daily. 90 tablet 3   metoprolol succinate (TOPROL-XL) 50 MG 24 hr tablet TAKE 1 TABLET BY MOUTH DAILY. TAKE WITH OR IMMEDIATELY FOLLOWING A MEAL. 90 tablet 3    Multiple Vitamin (MULTIVITAMIN) tablet Take 1 tablet by mouth daily.     nitroGLYCERIN (NITROSTAT) 0.4 MG SL tablet Place 1 tablet (0.4 mg total) under the tongue every 5 (five) minutes as needed for chest pain. 25 tablet 3   pantoprazole (PROTONIX) 20 MG tablet Take 1 tablet (20 mg total) by mouth daily. Please contact the office to schedule appointment for additional refills. 90 tablet 2   rosuvastatin (CRESTOR) 40 MG tablet TAKE 1 TABLET BY MOUTH EVERY DAY. Please contact the office to schedule appointment for additional refills. 90 tablet 2   sacubitril-valsartan (ENTRESTO) 49-51 MG Take 1 tablet by mouth 2 (two) times daily. 180 tablet 2   Artificial Tear Ointment (DRY EYES OP) Place 1 drop into both eyes daily as needed (Dry eye).     valACYclovir (VALTREX) 1000 MG tablet Take 1 tablet (1,000 mg total) by mouth 3 (three) times daily. 21 tablet 0   No facility-administered medications prior to visit.     Per HPI unless specifically indicated in ROS section below Review of Systems  Constitutional:  Negative for fatigue and fever.  HENT:  Negative for ear pain.   Eyes:  Negative for pain.  Respiratory:  Negative for cough and shortness of  breath.   Cardiovascular:  Negative for chest pain, palpitations and leg swelling.  Gastrointestinal:  Negative for abdominal pain.  Genitourinary:  Negative for dysuria.  Musculoskeletal:  Negative for arthralgias.  Neurological:  Negative for syncope, light-headedness and headaches.  Psychiatric/Behavioral:  Negative for dysphoric mood.    Objective:  BP 132/82 (BP Location: Left Arm)   Pulse 66   Temp 99.4 F (37.4 C) (Temporal)   Ht 5\' 8"  (1.727 m)   Wt 210 lb 6.4 oz (95.4 kg)   SpO2 98%   BMI 31.99 kg/m   Wt Readings from Last 3 Encounters:  07/31/22 210 lb 6.4 oz (95.4 kg)  06/03/22 213 lb (96.6 kg)  05/12/22 218 lb 4 oz (99 kg)      Physical Exam Constitutional:      Appearance: He is well-developed.  HENT:     Head:  Normocephalic.     Right Ear: Hearing normal.     Left Ear: Hearing normal.     Nose: Nose normal.  Neck:     Thyroid: No thyroid mass or thyromegaly.     Vascular: No carotid bruit.     Trachea: Trachea normal.  Cardiovascular:     Rate and Rhythm: Normal rate and regular rhythm.     Pulses: Normal pulses.     Heart sounds: Heart sounds not distant. No murmur heard.    No friction rub. No gallop.     Comments: No peripheral edema Pulmonary:     Effort: Pulmonary effort is normal. No respiratory distress.     Breath sounds: Normal breath sounds.  Skin:    General: Skin is warm and dry.     Findings: No rash.  Psychiatric:        Speech: Speech normal.        Behavior: Behavior normal.        Thought Content: Thought content normal.       Results for orders placed or performed in visit on 07/31/22  POC COVID-19  Result Value Ref Range   SARS Coronavirus 2 Ag Positive (A) Negative    Assessment and Plan  Nasal congestion -     POC COVID-19 BinaxNow  COVID-19 Assessment & Plan: COVID19  Infection < 5 days from onset of symptoms in a vaccinated overweight individual with history of HFrEF , cad and piror lobectomy for lung cancer  No clear sign of bacterial infection at this time.   No SOB.  No red flags/need for ER visit or in-person exam at respiratory clinic at this time..    Pt higher risk for COVID complications given  HFrEF , cad and piror lobectomy for lung cancer. GFR  >60 and but Paxlovid contraindicated given on Plavix .  Start molnupiravir 5 day course. Reviewed course of medication and side effect profile with patient in detail.   Symptomatic care with mucinex and cough suppressant at night. If SOB begins symptoms worsening.. have low threshold for in-person exam, if severe shortness of breath ER visit recommended.  Can monitor Oxygen saturation at home with home monitor if able to obtain.  Go to ER if O2 sat < 90% on room air.   Reviewed home care and  provided information through MyChart.  Recommended quarantine 5 days isolation recommended. Return to work day 6 and wear mask for 4 more days to complete 10 days. Provided info about prevention of spread of COVID 19.    Other orders -     molnupiravir EUA; Take 4  capsules (800 mg total) by mouth 2 (two) times daily for 5 days.  Dispense: 40 capsule; Refill: 0    No follow-ups on file.   Kerby Nora, MD

## 2022-07-31 NOTE — Telephone Encounter (Signed)
Left message for Brandon [name] that his insurance is not going to cover the Molnupiravir so continue with symptomatic care as discussed at office visit.

## 2022-07-31 NOTE — Assessment & Plan Note (Signed)
COVID19  Infection < 5 days from onset of symptoms in a vaccinated overweight individual with history of HFrEF , cad and piror lobectomy for lung cancer  No clear sign of bacterial infection at this time.   No SOB.  No red flags/need for ER visit or in-person exam at respiratory clinic at this time..    Pt higher risk for COVID complications given  HFrEF , cad and piror lobectomy for lung cancer. GFR  >60 and but Paxlovid contraindicated given on Plavix .  Start molnupiravir 5 day course. Reviewed course of medication and side effect profile with patient in detail.   Symptomatic care with mucinex and cough suppressant at night. If SOB begins symptoms worsening.. have low threshold for in-person exam, if severe shortness of breath ER visit recommended.  Can monitor Oxygen saturation at home with home monitor if able to obtain.  Go to ER if O2 sat < 90% on room air.   Reviewed home care and provided information through MyChart.  Recommended quarantine 5 days isolation recommended. Return to work day 6 and wear mask for 4 more days to complete 10 days. Provided info about prevention of spread of COVID 19.

## 2022-08-11 ENCOUNTER — Telehealth: Payer: Self-pay | Admitting: *Deleted

## 2022-08-11 NOTE — Telephone Encounter (Signed)
Call to pt to clarify if on Plavix. Pt states he is on med and has instructions to stop med 5 days prior to procedure. Note found after call  06/03/22 stating this.

## 2022-08-19 ENCOUNTER — Ambulatory Visit (AMBULATORY_SURGERY_CENTER): Payer: Medicare HMO | Admitting: *Deleted

## 2022-08-19 ENCOUNTER — Encounter: Payer: Self-pay | Admitting: Gastroenterology

## 2022-08-19 VITALS — Ht 68.0 in | Wt 210.0 lb

## 2022-08-19 DIAGNOSIS — Z800 Family history of malignant neoplasm of digestive organs: Secondary | ICD-10-CM

## 2022-08-19 DIAGNOSIS — Z85038 Personal history of other malignant neoplasm of large intestine: Secondary | ICD-10-CM

## 2022-08-19 DIAGNOSIS — Z86010 Personal history of colonic polyps: Secondary | ICD-10-CM

## 2022-08-19 DIAGNOSIS — Z1211 Encounter for screening for malignant neoplasm of colon: Secondary | ICD-10-CM

## 2022-08-19 NOTE — Progress Notes (Addendum)
Pt's name and DOB verified at the beginning of the pre-visit.  Pt denies any difficulty with ambulating,sitting, laying down or rolling side to side Gave both [facility] main # and MD on call # prior to instructions.  No egg or soy allergy known to patient  No issues known to pt with past sedation with any surgeries or procedures Pt denies having issues being intubated Pt has no issues moving head neck or swallowing No FH of Malignant Hyperthermia Pt is not on diet pills Pt is not on home 02  Pt is not on blood thinners  Pt denies issues with constipation  Pt is not on dialysis Pt denies any upcoming cardiac testing Pt encouraged to use to use Singlecare or Goodrx to reduce cost  Patient's chart reviewed by Cathlyn Parsons CNRA prior to pre-visit and patient appropriate for the [facility].  Pre-visit completed and red dot placed by patient's name on their procedure day (on provider's schedule).  . Visit by phone Pt states weight is 210 lb Instructed pt why it is important to and  to call if they have any changes in health or new medications. Directed them to the # given and on instructions.   Pt states they will.  Instructions reviewed with pt and pt states understanding. Instructed to review again prior to procedure. Pt states they will.  Instructions sent by mail with coupon and by my chart To stop Plavix for 5 days prior to procedure pt stated understood Instructed pt not  to smoke marijuana day before and day produce Pt was given Clenpiq by MD at [facility]

## 2022-09-01 ENCOUNTER — Encounter: Payer: Self-pay | Admitting: Gastroenterology

## 2022-09-01 ENCOUNTER — Ambulatory Visit (AMBULATORY_SURGERY_CENTER): Payer: Medicare HMO | Admitting: Gastroenterology

## 2022-09-01 VITALS — BP 111/53 | HR 51 | Temp 97.5°F | Resp 12 | Ht 68.0 in | Wt 210.0 lb

## 2022-09-01 DIAGNOSIS — K641 Second degree hemorrhoids: Secondary | ICD-10-CM | POA: Diagnosis not present

## 2022-09-01 DIAGNOSIS — Z86010 Personal history of colonic polyps: Secondary | ICD-10-CM | POA: Diagnosis not present

## 2022-09-01 DIAGNOSIS — Z800 Family history of malignant neoplasm of digestive organs: Secondary | ICD-10-CM | POA: Diagnosis not present

## 2022-09-01 DIAGNOSIS — I10 Essential (primary) hypertension: Secondary | ICD-10-CM | POA: Diagnosis not present

## 2022-09-01 DIAGNOSIS — I2510 Atherosclerotic heart disease of native coronary artery without angina pectoris: Secondary | ICD-10-CM | POA: Diagnosis not present

## 2022-09-01 DIAGNOSIS — K5730 Diverticulosis of large intestine without perforation or abscess without bleeding: Secondary | ICD-10-CM

## 2022-09-01 DIAGNOSIS — Z09 Encounter for follow-up examination after completed treatment for conditions other than malignant neoplasm: Secondary | ICD-10-CM | POA: Diagnosis not present

## 2022-09-01 DIAGNOSIS — D124 Benign neoplasm of descending colon: Secondary | ICD-10-CM

## 2022-09-01 MED ORDER — SODIUM CHLORIDE 0.9 % IV SOLN: 500.0000 mL | Freq: Once | INTRAVENOUS | Status: DC

## 2022-09-01 NOTE — Progress Notes (Signed)
Report to PACU, RN, vss, BBS= Clear.  

## 2022-09-01 NOTE — Progress Notes (Signed)
GASTROENTEROLOGY PROCEDURE H&P NOTE   Primary Care Physician: Excell Seltzer, MD    Reason for Procedure:   Colon polyp surveillance, Fhx of colon cancer  Plan:    Colonoscopy  Patient is appropriate for endoscopic procedure(s) in the ambulatory ([facility]) setting.  The nature of the procedure, as well as the risks, benefits, and alternatives were carefully and thoroughly reviewed with the patient. Ample time for discussion and questions allowed. The patient understood, was satisfied, and agreed to proceed.     HPI: Brandon [name] is a 67 y.o. male who presents for Colonsocopy for ongoing polyp surveillance.   Family history notable for father with colon cancer, > age 41.    Last colonoscopy was 06/2020 and notable for >10 adenomas as below.  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  Holding Plavix x5 days for procedure today.    Endoscopic History: -07/12/2020: Colonoscopy: 10 polyps in the cecum through the descending colon ranging 3-12 mm (path: Tubular adenomas), 2 sigmoid polyps (path: TA x 1), sigmoid diverticulosis, internal hemorrhoids and hypertrophied anal papilla.  Normal TI.  Since he had >10 adenomas, recommended repeat in 1 year and referral [facility]  Past Medical History:  Diagnosis Date   Arthritis    Basal cell carcinoma    CAD (coronary artery disease) 06/14/2018   Cath on 01/23/2014 with LAD occlusion   Essential hypertension 06/08/2018   Gastroesophageal reflux disease 06/08/2018   GERD (gastroesophageal reflux disease)    Heart disease    Heart failure with reduced ejection fraction (HCC) 06/14/2018   ECHO 01/22/2014 with EF 40-45%   History of chicken pox    History of migraine    Hx of basal cell carcinoma 06/08/2018   04/2017. Left jaw   Hx of CABG    Hyperlipidemia    Inguinal hernia    Mixed hyperlipidemia 06/08/2018   Myocardial infarction [facility])    Tobacco use  disorder 06/08/2018    Past Surgical History:  Procedure Laterality Date   APPENDECTOMY  1960   BRONCHIAL BIOPSY  01/16/2021   Procedure: BRONCHIAL BIOPSIES;  Surgeon: Josephine Igo, DO;  Location: [facility];  Service: Pulmonary;;   BRONCHIAL BRUSHINGS  01/16/2021   Procedure: BRONCHIAL BRUSHINGS;  Surgeon: Josephine Igo, DO;  Location: [facility];  Service: Pulmonary;;   CORONARY ARTERY BYPASS GRAFT     x 3   FIDUCIAL MARKER PLACEMENT  01/16/2021   Procedure: FIDUCIAL DYE MARKING;  Surgeon: Josephine Igo, DO;  Location: [facility];  Service: Pulmonary;;   INGUINAL HERNIA REPAIR  2008   LOBECTOMY Right    2022   SKIN CANCER EXCISION     Stent placed     in 2005 and in 2014   VIDEO BRONCHOSCOPY WITH ENDOBRONCHIAL NAVIGATION N/A 01/16/2021   Procedure: VIDEO BRONCHOSCOPY WITH ENDOBRONCHIAL NAVIGATION;  Surgeon: Josephine Igo, DO;  Location: [facility];  Service: Pulmonary;  Laterality: N/A;  ION, ICG+MB Dye Marking   VIDEO BRONCHOSCOPY WITH RADIAL ENDOBRONCHIAL ULTRASOUND  01/16/2021   Procedure: VIDEO BRONCHOSCOPY WITH RADIAL ENDOBRONCHIAL ULTRASOUND;  Surgeon: Josephine Igo, DO;  Location: [facility];  Service: Pulmonary;;    Prior to Admission medications   Medication Sig Start Date End Date Taking? Authorizing Provider  aspirin EC 81 MG tablet Take 1 tablet (81 mg total) by mouth daily. 12/13/18  Yes Corky Crafts, MD  ezetimibe (ZETIA) 10 MG tablet Take 1  tablet (10 mg total) by mouth daily. Please contact the office to schedule appointment for additional refills. 03/03/22  Yes Corky Crafts, MD  fenofibrate (TRICOR) 145 MG tablet Take 1 tablet (145 mg total) by mouth daily. 03/03/22  Yes Swinyer, Zachary George, NP  metoprolol succinate (TOPROL-XL) 50 MG 24 hr tablet TAKE 1 TABLET BY MOUTH DAILY. TAKE WITH OR IMMEDIATELY FOLLOWING A MEAL. 01/12/22  Yes Corky Crafts, MD  rosuvastatin (CRESTOR) 40 MG tablet TAKE 1 TABLET BY MOUTH EVERY DAY.  Please contact the office to schedule appointment for additional refills. 03/03/22  Yes Corky Crafts, MD  sacubitril-valsartan (ENTRESTO) 49-51 MG Take 1 tablet by mouth 2 (two) times daily. 02/27/22  Yes Corky Crafts, MD  clopidogrel (PLAVIX) 75 MG tablet TAKE 1 TABLET BY MOUTH EVERY DAY 04/10/22   Corky Crafts, MD  Multiple Vitamin (MULTIVITAMIN) tablet Take 1 tablet by mouth daily.    [provider]  nitroGLYCERIN (NITROSTAT) 0.4 MG SL tablet Place 1 tablet (0.4 mg total) under the tongue every 5 (five) minutes as needed for chest pain. Patient not taking: Reported on 08/19/2022 10/22/21   Corky Crafts, MD  pantoprazole (PROTONIX) 20 MG tablet Take 1 tablet (20 mg total) by mouth daily. Please contact the office to schedule appointment for additional refills. Patient not taking: Reported on 08/19/2022 03/03/22   Corky Crafts, MD    Current Outpatient Medications  Medication Sig Dispense Refill   aspirin EC 81 MG tablet Take 1 tablet (81 mg total) by mouth daily. 90 tablet 3   ezetimibe (ZETIA) 10 MG tablet Take 1 tablet (10 mg total) by mouth daily. Please contact the office to schedule appointment for additional refills. 90 tablet 2   fenofibrate (TRICOR) 145 MG tablet Take 1 tablet (145 mg total) by mouth daily. 90 tablet 3   metoprolol succinate (TOPROL-XL) 50 MG 24 hr tablet TAKE 1 TABLET BY MOUTH DAILY. TAKE WITH OR IMMEDIATELY FOLLOWING A MEAL. 90 tablet 3   rosuvastatin (CRESTOR) 40 MG tablet TAKE 1 TABLET BY MOUTH EVERY DAY. Please contact the office to schedule appointment for additional refills. 90 tablet 2   sacubitril-valsartan (ENTRESTO) 49-51 MG Take 1 tablet by mouth 2 (two) times daily. 180 tablet 2   clopidogrel (PLAVIX) 75 MG tablet TAKE 1 TABLET BY MOUTH EVERY DAY 90 tablet 3   Multiple Vitamin (MULTIVITAMIN) tablet Take 1 tablet by mouth daily.     nitroGLYCERIN (NITROSTAT) 0.4 MG SL tablet Place 1 tablet (0.4 mg total) under the  tongue every 5 (five) minutes as needed for chest pain. (Patient not taking: Reported on 08/19/2022) 25 tablet 3   pantoprazole (PROTONIX) 20 MG tablet Take 1 tablet (20 mg total) by mouth daily. Please contact the office to schedule appointment for additional refills. (Patient not taking: Reported on 08/19/2022) 90 tablet 2   Current Facility-Administered Medications  Medication Dose Route Frequency Provider Last Rate Last Admin   0.9 %  sodium chloride infusion  500 mL Intravenous Once Tere Mcconaughey V, DO        Allergies as of 09/01/2022 - Review Complete 09/01/2022  Allergen Reaction Noted   Antihistamines, loratadine-type Hives 06/08/2018   Penicillins Other (See Comments) 06/08/2018   Shellfish allergy Swelling 06/08/2018   Iodine Nausea And Vomiting 06/08/2018    Family History  Problem Relation Age of Onset   Dementia Mother    Heart attack Mother 91   Heart disease Mother    Colon cancer  Father 14   Diabetes Father    Colon polyps Sister    Heart attack Brother 67   Heart attack Maternal Grandmother    Heart attack Maternal Grandfather    Stroke Paternal Grandmother 64   Prostate cancer Paternal Grandfather     Social History   Socioeconomic History   Marital status: Divorced    Spouse name: Not on file   Number of children: 0   Years of education: Not on file   Highest education level: Not on file  Occupational History   Not on file  Tobacco Use   Smoking status: Former    Packs/day: 1.50    Years: 40.00    Additional pack years: 0.00    Total pack years: 60.00    Types: Cigarettes    Start date: 68    Quit date: 04/21/2019    Years since quitting: 3.3   Smokeless tobacco: Former    Types: Chew    Quit date: 04/21/2003  Vaping Use   Vaping Use: Every day   Substances: Nicotine  Substance and Sexual Activity   Alcohol use: Yes    Comment: 2 to 3 beers daily   Drug use: Yes    Types: Marijuana    Comment: once a week   Sexual activity: Yes  Other  Topics Concern   Not on file  Social History Narrative   06/15/19   From: [place]   Living: alone, moved to help care for his mother   Work: retired Financial trader      Family: Mother still living, sister is nearby - [place]      Enjoys: fishing      Exercise: not currently   Diet: eating more than he should, trying to reduce sweets      Safety   Seat belts: Yes    Guns: Yes    Safe in relationships: Yes    Social Determinants of Corporate investment banker Strain: Not on file  Food Insecurity: Not on file  Transportation Needs: Not on file  Physical Activity: Not on file  Stress: Not on file  Social Connections: Not on file  Intimate Partner Violence: Not on file    Physical Exam: Vital signs in last 24 hours: @BP  (!) 97/56   Pulse 60   Temp (!) 97.5 F (36.4 C)   Resp 14   Ht 5\' 8"  (1.727 m)   Wt 210 lb (95.3 kg)   SpO2 94%   BMI 31.93 kg/m  GEN: NAD EYE: Sclerae anicteric ENT: MMM CV: Non-tachycardic Pulm: CTA b/l GI: Soft, NT/ND NEURO:  Alert & Oriented x 3   Doristine Locks, DO [facility]   09/01/2022 10:06 AM

## 2022-09-01 NOTE — Patient Instructions (Signed)
Resume Plavix as normal tomorrow 09/02/22   YOU HAD AN ENDOSCOPIC PROCEDURE TODAY AT THE [facility]:   Refer to the procedure report that was given to you for any specific questions about what was found during the examination.  If the procedure report does not answer your questions, please call your gastroenterologist to clarify.  If you requested that your care partner not be given the details of your procedure findings, then the procedure report has been included in a sealed envelope for you to review at your convenience later.  YOU SHOULD EXPECT: Some feelings of bloating in the abdomen. Passage of more gas than usual.  Walking can help get rid of the air that was put into your GI tract during the procedure and reduce the bloating. If you had a lower endoscopy (such as a colonoscopy or flexible sigmoidoscopy) you may notice spotting of blood in your stool or on the toilet paper. If you underwent a bowel prep for your procedure, you may not have a normal bowel movement for a few days.  Please Note:  You might notice some irritation and congestion in your nose or some drainage.  This is from the oxygen used during your procedure.  There is no need for concern and it should clear up in a day or so.  SYMPTOMS TO REPORT IMMEDIATELY:  Following lower endoscopy (colonoscopy or flexible sigmoidoscopy):  Excessive amounts of blood in the stool  Significant tenderness or worsening of abdominal pains  Swelling of the abdomen that is new, acute  Fever of 100F or higher  For urgent or emergent issues, a gastroenterologist can be reached at any hour by calling [phone]. Do not use MyChart messaging for urgent concerns.    DIET:  We do recommend a small meal at first, but then you may proceed to your regular diet.  Drink plenty of fluids but you should avoid alcoholic beverages for 24 hours.  ACTIVITY:  You should plan to take it easy for the rest of today and you should NOT DRIVE or  use heavy machinery until tomorrow (because of the sedation medicines used during the test).    FOLLOW UP: Our staff will call the number listed on your records the next business day following your procedure.  We will call around 7:15- 8:00 am to check on you and address any questions or concerns that you may have regarding the information given to you following your procedure. If we do not reach you, we will leave a message.     If any biopsies were taken you will be contacted by phone or by letter within the next 1-3 weeks.  Please call us at [phone] if you have not heard about the biopsies in 3 weeks.    SIGNATURES/CONFIDENTIALITY: You and/or your care partner have signed paperwork which will be entered into your electronic medical record.  These signatures attest to the fact that that the information above on your After Visit Summary has been reviewed and is understood.  Full responsibility of the confidentiality of this discharge information lies with you and/or your care-partner.

## 2022-09-01 NOTE — Op Note (Signed)
[facility] Patient Name: Brandon [name] Procedure Date: 09/01/2022 10:03 AM MRN: [id number] Endoscopist: Doristine Locks , MD, [id number] Age: 67 Referring MD:  Date of Birth: 08/27/1955 Gender: Male Account #: [id number] Procedure:                Colonoscopy Indications:              Surveillance: History of numerous (> 10) adenomas                            on last colonoscopy (< 3 yrs)                           Last Colonoscopy was 07/12/2020 and notable for 10                            polyps in the cecum through the descending colon                            ranging 3-12 mm (path: Tubular adenomas), 2 sigmoid                            polyps (path: TA x 1), sigmoid diverticulosis,                            internal hemorrhoids and hypertrophied anal                            papilla. Normal TI. Since he had >10 adenomas,                            recommended repeat in 1 year and referral to the                            [facility] Medicines:                Monitored Anesthesia Care Procedure:                Pre-Anesthesia Assessment:                           - Prior to the procedure, a History and Physical                            was performed, and patient medications and                            allergies were reviewed. The patient's tolerance of                            previous anesthesia was also reviewed. The risks                            and benefits of the procedure and the sedation  options and risks were discussed with the patient.                            All questions were answered, and informed consent                            was obtained. Prior Anticoagulants: The patient has                            taken Plavix (clopidogrel), last dose was 6 days                            prior to procedure. ASA Grade Assessment: III - A                            patient with severe systemic disease. After                             reviewing the risks and benefits, the patient was                            deemed in satisfactory condition to undergo the                            procedure.                           After obtaining informed consent, the colonoscope                            was passed under direct vision. Throughout the                            procedure, the patient's blood pressure, pulse, and                            oxygen saturations were monitored continuously. The                            Olympus CF-HQ190L SN [id number] was introduced through                            the anus and advanced to the the cecum, identified                            by appendiceal orifice and ileocecal valve. The                            colonoscopy was performed without difficulty. The                            patient tolerated the procedure well. The quality  of the bowel preparation was good. The ileocecal                            valve, appendiceal orifice, and rectum were                            photographed. Scope In: 10:13:04 AM Scope Out: 10:29:58 AM Scope Withdrawal Time: 0 hours 13 minutes 36 seconds  Total Procedure Duration: 0 hours 16 minutes 54 seconds  Findings:                 Skin tags were found on perianal exam.                           Two sessile polyps were found in the descending                            colon. The polyps were 3 to 5 mm in size. These                            polyps were removed with a cold snare. Resection                            and retrieval were complete. Estimated blood loss                            was minimal.                           Multiple medium-mouthed and small-mouthed                            diverticula were found in the sigmoid colon.                           Non-bleeding internal hemorrhoids were found during                            retroflexion. The hemorrhoids were small and  Grade                            II (internal hemorrhoids that prolapse but reduce                            spontaneously).                           Anal papilla(e) were hypertrophied. Complications:            No immediate complications. Estimated Blood Loss:     Estimated blood loss was minimal. Impression:               - Perianal skin tags found on perianal exam.                           - Two 3 to 5 mm polyps in the descending  colon,                            removed with a cold snare. Resected and retrieved.                           - Diverticulosis in the sigmoid colon.                           - Non-bleeding internal hemorrhoids.                           - Anal papilla(e) were hypertrophied.                           - The GI Genius (intelligent endoscopy module),                            computer-aided polyp detection system powered by AI                            was utilized to detect colorectal polyps through                            enhanced visualization during colonoscopy. Recommendation:           - Patient has a contact number available for                            emergencies. The signs and symptoms of potential                            delayed complications were discussed with the                            patient. Return to normal activities tomorrow.                            Written discharge instructions were provided to the                            patient.                           - Resume previous diet.                           - Continue present medications.                           - Await pathology results.                           - Repeat colonoscopy for surveillance based on                            pathology results.                           -  Resume Plavix (clopidogrel) at prior dose                            tomorrow. Doristine Locks, MD 09/01/2022 10:40:47 AM

## 2022-09-01 NOTE — Progress Notes (Signed)
Called to room to assist during endoscopic procedure.  Patient ID and intended procedure confirmed with present staff. Received instructions for my participation in the procedure from the performing physician.  

## 2022-09-01 NOTE — Progress Notes (Signed)
Pt's states no medical or surgical changes since previsit or office visit. 

## 2022-09-02 ENCOUNTER — Encounter: Payer: Self-pay | Admitting: Gastroenterology

## 2022-09-02 ENCOUNTER — Telehealth: Payer: Self-pay | Admitting: *Deleted

## 2022-09-02 NOTE — Telephone Encounter (Signed)
  Follow up Call-     09/01/2022    9:49 AM 07/12/2020    9:23 AM  Call back number  Post procedure Call Back phone  # [phone]  Permission to leave phone message Yes Yes     Patient questions:  Do you have a fever, pain , or abdominal swelling? No. Pain Score  0 *  Have you tolerated food without any problems? Yes.    Have you been able to return to your normal activities? Yes.    Do you have any questions about your discharge instructions: Diet   No. Medications  No. Follow up visit  No.  Do you have questions or concerns about your Care? No.  Actions: * If pain score is 4 or above: No action needed, pain <4.

## 2022-09-04 ENCOUNTER — Other Ambulatory Visit: Payer: Self-pay | Admitting: Physician Assistant

## 2022-09-04 DIAGNOSIS — C3490 Malignant neoplasm of unspecified part of unspecified bronchus or lung: Secondary | ICD-10-CM

## 2022-09-07 ENCOUNTER — Encounter: Payer: Self-pay | Admitting: Gastroenterology

## 2022-09-09 ENCOUNTER — Telehealth: Payer: Self-pay | Admitting: Internal Medicine

## 2022-09-09 NOTE — Telephone Encounter (Signed)
Called patient regarding upcoming June appointments, patient is notified.  

## 2022-09-22 ENCOUNTER — Ambulatory Visit (HOSPITAL_COMMUNITY)
Admission: RE | Admit: 2022-09-22 | Discharge: 2022-09-22 | Disposition: A | Discharge status: Home / Self Care | Payer: Medicare HMO | Source: Ambulatory Visit | Attending: Internal Medicine | Admitting: Internal Medicine

## 2022-09-22 ENCOUNTER — Inpatient Hospital Stay: Payer: Medicare HMO | Attending: Internal Medicine

## 2022-09-22 ENCOUNTER — Other Ambulatory Visit: Payer: Self-pay

## 2022-09-22 DIAGNOSIS — K219 Gastro-esophageal reflux disease without esophagitis: Secondary | ICD-10-CM | POA: Insufficient documentation

## 2022-09-22 DIAGNOSIS — C3490 Malignant neoplasm of unspecified part of unspecified bronchus or lung: Secondary | ICD-10-CM

## 2022-09-22 DIAGNOSIS — R911 Solitary pulmonary nodule: Secondary | ICD-10-CM | POA: Diagnosis not present

## 2022-09-22 DIAGNOSIS — Z87891 Personal history of nicotine dependence: Secondary | ICD-10-CM | POA: Insufficient documentation

## 2022-09-22 DIAGNOSIS — I252 Old myocardial infarction: Secondary | ICD-10-CM | POA: Insufficient documentation

## 2022-09-22 DIAGNOSIS — E782 Mixed hyperlipidemia: Secondary | ICD-10-CM | POA: Insufficient documentation

## 2022-09-22 DIAGNOSIS — J984 Other disorders of lung: Secondary | ICD-10-CM | POA: Insufficient documentation

## 2022-09-22 DIAGNOSIS — I2510 Atherosclerotic heart disease of native coronary artery without angina pectoris: Secondary | ICD-10-CM | POA: Insufficient documentation

## 2022-09-22 DIAGNOSIS — I700 Atherosclerosis of aorta: Secondary | ICD-10-CM | POA: Insufficient documentation

## 2022-09-22 DIAGNOSIS — C3411 Malignant neoplasm of upper lobe, right bronchus or lung: Secondary | ICD-10-CM | POA: Insufficient documentation

## 2022-09-22 DIAGNOSIS — Z7902 Long term (current) use of antithrombotics/antiplatelets: Secondary | ICD-10-CM | POA: Insufficient documentation

## 2022-09-22 DIAGNOSIS — Z85828 Personal history of other malignant neoplasm of skin: Secondary | ICD-10-CM | POA: Insufficient documentation

## 2022-09-22 DIAGNOSIS — E041 Nontoxic single thyroid nodule: Secondary | ICD-10-CM | POA: Insufficient documentation

## 2022-09-22 DIAGNOSIS — Z79899 Other long term (current) drug therapy: Secondary | ICD-10-CM | POA: Insufficient documentation

## 2022-09-22 DIAGNOSIS — Z9049 Acquired absence of other specified parts of digestive tract: Secondary | ICD-10-CM | POA: Insufficient documentation

## 2022-09-22 DIAGNOSIS — Z888 Allergy status to other drugs, medicaments and biological substances status: Secondary | ICD-10-CM | POA: Insufficient documentation

## 2022-09-22 DIAGNOSIS — Z880 Allergy status to penicillin: Secondary | ICD-10-CM | POA: Insufficient documentation

## 2022-09-22 DIAGNOSIS — Z902 Acquired absence of lung [part of]: Secondary | ICD-10-CM | POA: Insufficient documentation

## 2022-09-22 LAB — CMP (CANCER CENTER ONLY)
ALT: 15 U/L (ref 0–44)
AST: 14 U/L — ABNORMAL LOW (ref 15–41)
Albumin: 4.4 g/dL (ref 3.5–5.0)
Alkaline Phosphatase: 33 U/L — ABNORMAL LOW (ref 38–126)
Anion gap: 6 (ref 5–15)
BUN: 19 mg/dL (ref 8–23)
CO2: 28 mmol/L (ref 22–32)
Calcium: 9.2 mg/dL (ref 8.9–10.3)
Chloride: 106 mmol/L (ref 98–111)
Creatinine: 1.43 mg/dL — ABNORMAL HIGH (ref 0.61–1.24)
GFR, Estimated: 53 mL/min — ABNORMAL LOW (ref >60)
Glucose, Bld: 111 mg/dL — ABNORMAL HIGH (ref 70–99)
Potassium: 4.3 mmol/L (ref 3.5–5.1)
Sodium: 140 mmol/L (ref 135–145)
Total Bilirubin: 0.4 mg/dL (ref 0.3–1.2)
Total Protein: 6.6 g/dL (ref 6.5–8.1)

## 2022-09-22 LAB — CBC WITH DIFFERENTIAL (CANCER CENTER ONLY)
Abs Immature Granulocytes: 0.01 10*3/uL (ref 0.00–0.07)
Basophils Absolute: 0 10*3/uL (ref 0.0–0.1)
Basophils Relative: 1 %
Eosinophils Absolute: 0.2 10*3/uL (ref 0.0–0.5)
Eosinophils Relative: 3 %
HCT: 40.3 % (ref 39.0–52.0)
Hemoglobin: 13.1 g/dL (ref 13.0–17.0)
Immature Granulocytes: 0 %
Lymphocytes Relative: 35 %
Lymphs Abs: 2 10*3/uL (ref 0.7–4.0)
MCH: 30.3 pg (ref 26.0–34.0)
MCHC: 32.5 g/dL (ref 30.0–36.0)
MCV: 93.3 fL (ref 80.0–100.0)
Monocytes Absolute: 0.6 10*3/uL (ref 0.1–1.0)
Monocytes Relative: 10 %
Neutro Abs: 2.9 10*3/uL (ref 1.7–7.7)
Neutrophils Relative %: 51 %
Platelet Count: 256 10*3/uL (ref 150–400)
RBC: 4.32 MIL/uL (ref 4.22–5.81)
RDW: 13.1 % (ref 11.5–15.5)
WBC Count: 5.8 10*3/uL (ref 4.0–10.5)
nRBC: 0 % (ref 0.0–0.2)

## 2022-09-24 ENCOUNTER — Other Ambulatory Visit: Payer: Self-pay

## 2022-09-24 ENCOUNTER — Inpatient Hospital Stay (HOSPITAL_BASED_OUTPATIENT_CLINIC_OR_DEPARTMENT_OTHER): Payer: Medicare HMO | Admitting: Internal Medicine

## 2022-09-24 VITALS — BP 101/63 | HR 60 | Temp 97.6°F | Resp 16 | Wt 207.6 lb

## 2022-09-24 DIAGNOSIS — C3411 Malignant neoplasm of upper lobe, right bronchus or lung: Secondary | ICD-10-CM | POA: Diagnosis not present

## 2022-09-24 DIAGNOSIS — Z87891 Personal history of nicotine dependence: Secondary | ICD-10-CM | POA: Diagnosis not present

## 2022-09-24 DIAGNOSIS — Z7902 Long term (current) use of antithrombotics/antiplatelets: Secondary | ICD-10-CM | POA: Diagnosis not present

## 2022-09-24 DIAGNOSIS — K219 Gastro-esophageal reflux disease without esophagitis: Secondary | ICD-10-CM | POA: Diagnosis not present

## 2022-09-24 DIAGNOSIS — Z79899 Other long term (current) drug therapy: Secondary | ICD-10-CM | POA: Diagnosis not present

## 2022-09-24 DIAGNOSIS — E041 Nontoxic single thyroid nodule: Secondary | ICD-10-CM | POA: Diagnosis not present

## 2022-09-24 DIAGNOSIS — I252 Old myocardial infarction: Secondary | ICD-10-CM | POA: Diagnosis not present

## 2022-09-24 DIAGNOSIS — I2510 Atherosclerotic heart disease of native coronary artery without angina pectoris: Secondary | ICD-10-CM | POA: Diagnosis not present

## 2022-09-24 DIAGNOSIS — I700 Atherosclerosis of aorta: Secondary | ICD-10-CM | POA: Diagnosis not present

## 2022-09-24 DIAGNOSIS — Z9049 Acquired absence of other specified parts of digestive tract: Secondary | ICD-10-CM | POA: Diagnosis not present

## 2022-09-24 DIAGNOSIS — E782 Mixed hyperlipidemia: Secondary | ICD-10-CM | POA: Diagnosis not present

## 2022-09-24 DIAGNOSIS — Z85828 Personal history of other malignant neoplasm of skin: Secondary | ICD-10-CM | POA: Diagnosis not present

## 2022-09-24 DIAGNOSIS — Z888 Allergy status to other drugs, medicaments and biological substances status: Secondary | ICD-10-CM | POA: Diagnosis not present

## 2022-09-24 DIAGNOSIS — Z880 Allergy status to penicillin: Secondary | ICD-10-CM | POA: Diagnosis not present

## 2022-09-24 DIAGNOSIS — Z902 Acquired absence of lung [part of]: Secondary | ICD-10-CM | POA: Diagnosis not present

## 2022-09-24 DIAGNOSIS — J984 Other disorders of lung: Secondary | ICD-10-CM | POA: Diagnosis not present

## 2022-09-24 DIAGNOSIS — C3490 Malignant neoplasm of unspecified part of unspecified bronchus or lung: Secondary | ICD-10-CM

## 2022-09-24 NOTE — Progress Notes (Signed)
[facility] [phone]   [phone]  OFFICE PROGRESS NOTE  Excell Seltzer, MD [address] [place] [address]  DIAGNOSIS:  Stage IA (T1c, N0, M0) non-small cell lung cancer, moderately differentiated squamous cell carcinoma.  PRIOR THERAPY: status post right lower lobectomy with lymph node sampling on January 16, 2021 with a tumor size of 2.0 cm.  CURRENT THERAPY: Observation  INTERVAL HISTORY: Brandon [name] 67 y.o. male returns to the clinic today for annual follow-up visit.  The patient is feeling fine today with no concerning complaints.  He denied having any current chest pain, shortness of breath, cough or hemoptysis.  He has no nausea, vomiting, diarrhea or constipation.  He has no headache or visual changes.  He denied having any weight loss or night sweats.  He is here today for evaluation with repeat CT scan of the chest for restaging of his disease.   MEDICAL HISTORY: Past Medical History:  Diagnosis Date   Arthritis    Basal cell carcinoma    CAD (coronary artery disease) 06/14/2018   Cath on 01/23/2014 with LAD occlusion   Essential hypertension 06/08/2018   Gastroesophageal reflux disease 06/08/2018   GERD (gastroesophageal reflux disease)    Heart disease    Heart failure with reduced ejection fraction (HCC) 06/14/2018   ECHO 01/22/2014 with EF 40-45%   History of chicken pox    History of migraine    Hx of basal cell carcinoma 06/08/2018   04/2017. Left jaw   Hx of CABG    Hyperlipidemia    Inguinal hernia    Mixed hyperlipidemia 06/08/2018   Myocardial infarction [facility])    Tobacco use disorder 06/08/2018    ALLERGIES:  is allergic to antihistamines, loratadine-type; penicillins; shellfish allergy; and iodine.  MEDICATIONS:  Current Outpatient Medications  Medication Sig Dispense Refill   aspirin EC 81 MG tablet Take 1 tablet (81 mg total) by mouth daily. 90 tablet 3   clopidogrel (PLAVIX) 75 MG  tablet TAKE 1 TABLET BY MOUTH EVERY DAY 90 tablet 3   ezetimibe (ZETIA) 10 MG tablet Take 1 tablet (10 mg total) by mouth daily. Please contact the office to schedule appointment for additional refills. 90 tablet 2   fenofibrate (TRICOR) 145 MG tablet Take 1 tablet (145 mg total) by mouth daily. 90 tablet 3   metoprolol succinate (TOPROL-XL) 50 MG 24 hr tablet TAKE 1 TABLET BY MOUTH DAILY. TAKE WITH OR IMMEDIATELY FOLLOWING A MEAL. 90 tablet 3   Multiple Vitamin (MULTIVITAMIN) tablet Take 1 tablet by mouth daily.     nitroGLYCERIN (NITROSTAT) 0.4 MG SL tablet Place 1 tablet (0.4 mg total) under the tongue every 5 (five) minutes as needed for chest pain. (Patient not taking: Reported on 08/19/2022) 25 tablet 3   pantoprazole (PROTONIX) 20 MG tablet Take 1 tablet (20 mg total) by mouth daily. Please contact the office to schedule appointment for additional refills. (Patient not taking: Reported on 08/19/2022) 90 tablet 2   rosuvastatin (CRESTOR) 40 MG tablet TAKE 1 TABLET BY MOUTH EVERY DAY. Please contact the office to schedule appointment for additional refills. 90 tablet 2   sacubitril-valsartan (ENTRESTO) 49-51 MG Take 1 tablet by mouth 2 (two) times daily. 180 tablet 2   No current facility-administered medications for this visit.    SURGICAL HISTORY:  Past Surgical History:  Procedure Laterality Date   APPENDECTOMY  1960   BRONCHIAL BIOPSY  01/16/2021   Procedure: BRONCHIAL BIOPSIES;  Surgeon: Josephine Igo, DO;  Location: [facility];  Service: Pulmonary;;   BRONCHIAL BRUSHINGS  01/16/2021   Procedure: BRONCHIAL BRUSHINGS;  Surgeon: Josephine Igo, DO;  Location: [facility];  Service: Pulmonary;;   CORONARY ARTERY BYPASS GRAFT     x 3   FIDUCIAL MARKER PLACEMENT  01/16/2021   Procedure: FIDUCIAL DYE MARKING;  Surgeon: Josephine Igo, DO;  Location: [facility];  Service: Pulmonary;;   INGUINAL HERNIA REPAIR  2008   LOBECTOMY Right    2022   SKIN CANCER EXCISION     Stent  placed     in 2005 and in 2014   VIDEO BRONCHOSCOPY WITH ENDOBRONCHIAL NAVIGATION N/A 01/16/2021   Procedure: VIDEO BRONCHOSCOPY WITH ENDOBRONCHIAL NAVIGATION;  Surgeon: Josephine Igo, DO;  Location: [facility];  Service: Pulmonary;  Laterality: N/A;  ION, ICG+MB Dye Marking   VIDEO BRONCHOSCOPY WITH RADIAL ENDOBRONCHIAL ULTRASOUND  01/16/2021   Procedure: VIDEO BRONCHOSCOPY WITH RADIAL ENDOBRONCHIAL ULTRASOUND;  Surgeon: Josephine Igo, DO;  Location: [facility];  Service: Pulmonary;;    REVIEW OF SYSTEMS:  A comprehensive review of systems was negative.   PHYSICAL EXAMINATION: General appearance: alert, cooperative, and no distress Head: Normocephalic, without obvious abnormality, atraumatic Neck: no adenopathy, no JVD, supple, symmetrical, trachea midline, and thyroid not enlarged, symmetric, no tenderness/mass/nodules Lymph nodes: Cervical, supraclavicular, and axillary nodes normal. Resp: clear to auscultation bilaterally Back: symmetric, no curvature. ROM normal. No CVA tenderness. Cardio: regular rate and rhythm, S1, S2 normal, no murmur, click, rub or gallop GI: soft, non-tender; bowel sounds normal; no masses,  no organomegaly Extremities: extremities normal, atraumatic, no cyanosis or edema  ECOG PERFORMANCE STATUS: 0 - Asymptomatic  Blood pressure 101/63, pulse 60, temperature 97.6 F (36.4 C), temperature source Oral, resp. rate 16, weight 207 lb 9.6 oz (94.2 kg), SpO2 95 %.  LABORATORY DATA: Lab Results  Component Value Date   WBC 5.8 09/22/2022   HGB 13.1 09/22/2022   HCT 40.3 09/22/2022   MCV 93.3 09/22/2022   PLT 256 09/22/2022      Chemistry      Component Value Date/Time   NA 140 09/22/2022 1431   NA 140 03/06/2022 0954   K 4.3 09/22/2022 1431   CL 106 09/22/2022 1431   CO2 28 09/22/2022 1431   BUN 19 09/22/2022 1431   BUN 18 03/06/2022 0954   CREATININE 1.43 (H) 09/22/2022 1431      Component Value Date/Time   CALCIUM 9.2 09/22/2022 1431    ALKPHOS 33 (L) 09/22/2022 1431   AST 14 (L) 09/22/2022 1431   ALT 15 09/22/2022 1431   BILITOT 0.4 09/22/2022 1431       RADIOGRAPHIC STUDIES: CT Chest Wo Contrast  Result Date: 09/24/2022 CLINICAL DATA:  Non-small-cell lung cancer. Restaging. * Tracking Code: BO * EXAM: CT CHEST WITHOUT CONTRAST TECHNIQUE: Multidetector CT imaging of the chest was performed following the standard protocol without IV contrast. RADIATION DOSE REDUCTION: This exam was performed according to the departmental dose-optimization program which includes automated exposure control, adjustment of the mA and/or kV according to patient size and/or use of iterative reconstruction technique. COMPARISON:  03/23/2022 FINDINGS: Cardiovascular: The heart size is normal. No substantial pericardial effusion. Status post CABG. Mild atherosclerotic calcification is noted in the wall of the thoracic aorta. Mediastinum/Nodes: No mediastinal lymphadenopathy. Stable 1.8 cm left thyroid nodule. This has been evaluated on previous imaging. (ref: J Am Coll Radiol. 2015 Feb;12(2): 143-50).No evidence for gross hilar lymphadenopathy although assessment is limited  by the lack of intravenous contrast on the current study. The esophagus has normal imaging features. There is no axillary lymphadenopathy. Lungs/Pleura: Architectural distortion/scarring noted right lung status post right lower lobectomy. Index 6 mm left lower lobe pulmonary nodule on 85/8 is unchanged in the interval. No new suspicious pulmonary nodule or mass on today's exam. No focal airspace consolidation. There is no evidence of pleural effusion. Upper Abdomen: Stable water density lesion lateral segment left liver compatible with a cyst. No followup imaging is recommended. Visualized portion of the upper abdomen is unremarkable. Musculoskeletal: No worrisome lytic or sclerotic osseous abnormality. IMPRESSION: 1. Stable exam. No new or progressive findings to suggest recurrent or  metastatic disease. 2.  Aortic Atherosclerosis (ICD10-I70.0). Electronically Signed   By: Kennith Center M.D.   On: 09/24/2022 09:41    ASSESSMENT AND PLAN: This is a very pleasant 67 years old white male with stage IA (T1c, N0, M0) non-small cell lung cancer, moderately differentiated squamous cell carcinoma, status post right lower lobectomy with lymph node sampling on January 16, 2021 with a tumor size of 2.0 cm. The patient is currently on observation and he is feeling fine with no concerning complaints. He had repeat CT scan of the chest performed recently.  I personally and independently reviewed the scan and discussed the result with the patient today. His scan showed no concerning findings for disease recurrence or metastasis. I recommended for him to continue on observation with repeat CT scan of the chest without contrast in 1 year. He was advised to call immediately if he has any other concerning symptoms in the interval. The patient voices understanding of current disease status and treatment options and is in agreement with the current care plan.  All questions were answered. The patient knows to call the clinic with any problems, questions or concerns. We can certainly see the patient much sooner if necessary.  The total time spent in the appointment was 20 minutes.  Disclaimer: This note was dictated with voice recognition software. Similar sounding words can inadvertently be transcribed and may not be corrected upon review.

## 2022-11-10 ENCOUNTER — Encounter: Payer: Self-pay | Admitting: Family Medicine

## 2022-11-10 ENCOUNTER — Ambulatory Visit: Payer: Medicare HMO | Admitting: Family Medicine

## 2022-11-10 VITALS — BP 90/60 | HR 78 | Temp 97.7°F | Ht 68.0 in | Wt 208.5 lb

## 2022-11-10 DIAGNOSIS — E782 Mixed hyperlipidemia: Secondary | ICD-10-CM

## 2022-11-10 DIAGNOSIS — I10 Essential (primary) hypertension: Secondary | ICD-10-CM | POA: Diagnosis not present

## 2022-11-10 DIAGNOSIS — I5020 Unspecified systolic (congestive) heart failure: Secondary | ICD-10-CM

## 2022-11-10 DIAGNOSIS — Z0000 Encounter for general adult medical examination without abnormal findings: Secondary | ICD-10-CM | POA: Diagnosis not present

## 2022-11-10 DIAGNOSIS — Z23 Encounter for immunization: Secondary | ICD-10-CM

## 2022-11-10 DIAGNOSIS — C3431 Malignant neoplasm of lower lobe, right bronchus or lung: Secondary | ICD-10-CM | POA: Diagnosis not present

## 2022-11-10 DIAGNOSIS — R7303 Prediabetes: Secondary | ICD-10-CM | POA: Diagnosis not present

## 2022-11-10 NOTE — Patient Instructions (Addendum)
Try to decrease the Tagamet, avoid  reflux triggers.

## 2022-11-10 NOTE — Progress Notes (Signed)
Patient ID: Brandon [name], male    DOB: March 25, 1956, 67 y.o.   MRN: [id number]  This visit was conducted in person.  BP 90/60 (BP Location: Left Arm, Patient Position: Sitting, Cuff Size: Large)   Pulse 78   Temp 97.7 F (36.5 C) (Temporal)   Ht 5\' 8"  (1.727 m)   Wt 208 lb 8 oz (94.6 kg)   SpO2 97%   BMI 31.70 kg/m    CC:  Chief Complaint  Patient presents with   Medicare Wellness    Subjective:   HPI: Brandon [name] is a 67 y.o. male presenting on 11/10/2022 for Medicare Wellness  The patient presents for annual medicare wellness, complete physical and review of chronic health problems. He/She also has the following acute concerns today: He has been able to wean off prilosec... using tagamet twice a day... he feels this could be causing his lower BP.   He has noted some night sweats in last few months. Wt Readings from Last 3 Encounters:  11/10/22 208 lb 8 oz (94.6 kg)  09/24/22 207 lb 9.6 oz (94.2 kg)  09/01/22 210 lb (95.3 kg)     I have personally reviewed the Medicare Annual Wellness questionnaire and have noted 1. The patient's medical and social history 2. Their use of alcohol, tobacco or illicit drugs 3. Their current medications and supplements 4. The patient's functional ability including ADL's, fall risks, home safety risks and hearing or visual             impairment. 5. Diet and physical activities 6. Evidence for depression or mood disorders 7.         Updated provider list Cognitive evaluation was performed and recorded on pt medicare questionnaire form. The patients weight, height, BMI and visual acuity have been recorded in the chart   I have made referrals, counseling and provided education to the patient based review of the above and I have provided the pt with a written personalized care plan for preventive services.   Documentation of this information was scanned into the electronic record under the media tab.   Advance directives and end of  life planning reviewed in detail with patient and documented in EMR. Patient given handout on advance care directives if needed. HCPOA and living will updated if needed.  Hearing Screening  Method: Audiometry   500Hz  1000Hz  2000Hz  4000Hz   Right ear 20 20 20  0  Left ear 20 20 20 20    Vision Screening   Right eye Left eye Both eyes  Without correction     With correction 20/25 20/20 20/20     No falls in last 12 months.      Reviewed last OV with Dr. Arbutus Ped for 6 month follow up lung cancer status post right lower lobectomy with lymph node sampling on January 16, 2021 with a tumor size of 2.0 cm.   No chemo or radiation.   Hypertension:  Well-controlled on metoprolol XL and Entresto BP Readings from Last 3 Encounters:  11/10/22 90/60  09/24/22 101/63  09/01/22 (!) 111/53  Using medication without problems or lightheadedness:  occ with standing in the last few weeks Chest pain with exertion: none Edema:none Short of breath:none Average home BPs: Other issues:  Elevated Cholesterol: Well-controlled at last check on Zetia 10 mg daily and Crestor 40 mg daily Lab Results  Component Value Date   CHOL 140 03/06/2022   HDL 40 03/06/2022   LDLCALC 64 03/06/2022   TRIG 220 (H) 03/06/2022  CHOLHDL 3.5 03/06/2022  Using medications without problems: Muscle aches:  Diet compliance: moderate Exercise: walking 3-4 days a week  Other complaints:  CAD, s/P CABG and additional stent Congestive heart failure with reduced ejection fraction.  Echo October 2015 EF 40 to 45%, repeat echo is similar in December 2020. n 2000, angina was back pain but then went to this left arm.  He had an MI at that time and later had CABG, LIMA and 2 radial arteries.   CAD history: CABG x 3 in 2000, [place] , [place]. [facility] 2005: Stent at [facility].  Angina felt like intermittent back pain.  2015: Stent at [facility], 2.25 x 16 Promus in Radial to RCA.  He has been on Plavix in the past. His PMD  tried to stop his Plavix, but he preferred to stay on it. Followed by Dr. Eldridge Dace, cardiology..  Reviewed last office visit from July 2023    Prediabetes.Marland Kitchen dx with recent A1C 03/2022.. A1C 6.4  Relevant past medical, surgical, family and social history reviewed and updated as indicated. Interim medical history since our last visit reviewed. Allergies and medications reviewed and updated. Outpatient Medications Prior to Visit  Medication Sig Dispense Refill   aspirin EC 81 MG tablet Take 1 tablet (81 mg total) by mouth daily. 90 tablet 3   clopidogrel (PLAVIX) 75 MG tablet TAKE 1 TABLET BY MOUTH EVERY DAY 90 tablet 3   ezetimibe (ZETIA) 10 MG tablet Take 1 tablet (10 mg total) by mouth daily. Please contact the office to schedule appointment for additional refills. 90 tablet 2   fenofibrate (TRICOR) 145 MG tablet Take 1 tablet (145 mg total) by mouth daily. 90 tablet 3   metoprolol succinate (TOPROL-XL) 50 MG 24 hr tablet TAKE 1 TABLET BY MOUTH DAILY. TAKE WITH OR IMMEDIATELY FOLLOWING A MEAL. 90 tablet 3   Multiple Vitamin (MULTIVITAMIN) tablet Take 1 tablet by mouth daily.     nitroGLYCERIN (NITROSTAT) 0.4 MG SL tablet Place 1 tablet (0.4 mg total) under the tongue every 5 (five) minutes as needed for chest pain. 25 tablet 3   rosuvastatin (CRESTOR) 40 MG tablet TAKE 1 TABLET BY MOUTH EVERY DAY. Please contact the office to schedule appointment for additional refills. 90 tablet 2   sacubitril-valsartan (ENTRESTO) 49-51 MG Take 1 tablet by mouth 2 (two) times daily. 180 tablet 2   pantoprazole (PROTONIX) 20 MG tablet Take 1 tablet (20 mg total) by mouth daily. Please contact the office to schedule appointment for additional refills. (Patient not taking: Reported on 08/19/2022) 90 tablet 2   No facility-administered medications prior to visit.     Per HPI unless specifically indicated in ROS section below Review of Systems  Constitutional:  Negative for fatigue and fever.  HENT:  Negative  for ear pain.   Eyes:  Negative for pain.  Respiratory:  Negative for cough and shortness of breath.   Cardiovascular:  Negative for chest pain, palpitations and leg swelling.  Gastrointestinal:  Negative for abdominal pain.  Genitourinary:  Negative for dysuria.  Musculoskeletal:  Negative for arthralgias.  Neurological:  Negative for syncope, light-headedness and headaches.  Psychiatric/Behavioral:  Negative for dysphoric mood.    Objective:  BP 90/60 (BP Location: Left Arm, Patient Position: Sitting, Cuff Size: Large)   Pulse 78   Temp 97.7 F (36.5 C) (Temporal)   Ht 5\' 8"  (1.727 m)   Wt 208 lb 8 oz (94.6 kg)   SpO2 97%   BMI 31.70 kg/m  Wt Readings from Last 3 Encounters:  11/10/22 208 lb 8 oz (94.6 kg)  09/24/22 207 lb 9.6 oz (94.2 kg)  09/01/22 210 lb (95.3 kg)      Physical Exam Constitutional:      Appearance: He is well-developed.  HENT:     Head: Normocephalic.     Right Ear: Hearing normal.     Left Ear: Hearing normal.     Nose: Nose normal.  Neck:     Thyroid: No thyroid mass or thyromegaly.     Vascular: No carotid bruit.     Trachea: Trachea normal.  Cardiovascular:     Rate and Rhythm: Normal rate and regular rhythm.     Pulses: Normal pulses.     Heart sounds: Heart sounds not distant. No murmur heard.    No friction rub. No gallop.     Comments: No peripheral edema Pulmonary:     Effort: Pulmonary effort is normal. No respiratory distress.     Breath sounds: Normal breath sounds.  Skin:    General: Skin is warm and dry.     Findings: No rash.  Psychiatric:        Speech: Speech normal.        Behavior: Behavior normal.        Thought Content: Thought content normal.       Results for orders placed or performed in visit on 09/22/22  CBC with Differential (Cancer Center Only)  Result Value Ref Range   WBC Count 5.8 4.0 - 10.5 K/uL   RBC 4.32 4.22 - 5.81 MIL/uL   Hemoglobin 13.1 13.0 - 17.0 g/dL   HCT 16.1 09.6 - 04.5 %   MCV 93.3 80.0  - 100.0 fL   MCH 30.3 26.0 - 34.0 pg   MCHC 32.5 30.0 - 36.0 g/dL   RDW 40.9 81.1 - 91.4 %   Platelet Count 256 150 - 400 K/uL   nRBC 0.0 0.0 - 0.2 %   Neutrophils Relative % 51 %   Neutro Abs 2.9 1.7 - 7.7 K/uL   Lymphocytes Relative 35 %   Lymphs Abs 2.0 0.7 - 4.0 K/uL   Monocytes Relative 10 %   Monocytes Absolute 0.6 0.1 - 1.0 K/uL   Eosinophils Relative 3 %   Eosinophils Absolute 0.2 0.0 - 0.5 K/uL   Basophils Relative 1 %   Basophils Absolute 0.0 0.0 - 0.1 K/uL   Immature Granulocytes 0 %   Abs Immature Granulocytes 0.01 0.00 - 0.07 K/uL  CMP (Cancer Center only)  Result Value Ref Range   Sodium 140 135 - 145 mmol/L   Potassium 4.3 3.5 - 5.1 mmol/L   Chloride 106 98 - 111 mmol/L   CO2 28 22 - 32 mmol/L   Glucose, Bld 111 (H) 70 - 99 mg/dL   BUN 19 8 - 23 mg/dL   Creatinine 7.82 (H) 9.56 - 1.24 mg/dL   Calcium 9.2 8.9 - 21.3 mg/dL   Total Protein 6.6 6.5 - 8.1 g/dL   Albumin 4.4 3.5 - 5.0 g/dL   AST 14 (L) 15 - 41 U/L   ALT 15 0 - 44 U/L   Alkaline Phosphatase 33 (L) 38 - 126 U/L   Total Bilirubin 0.4 0.3 - 1.2 mg/dL   GFR, Estimated 53 (L) >60 mL/min   Anion gap 6 5 - 15    Assessment and Plan The patient's preventative maintenance and recommended screening tests for an annual wellness exam were reviewed in full today.  Brought up to date unless services declined.  Counselled on the importance of diet, exercise, and its role in overall health and mortality. The patient's FH and SH was reviewed, including their home life, tobacco status, and drug and alcohol status.    Nml Abdominal US, no AA in 2023.  Vaccines:uptodate flu and  COVID, consider PNA (given)and TDap ( per pt 2020 in [place], [place] when hook removed Prostate Cancer Screen: Lab Results  Component Value Date   PSA 2.95 05/12/2022  Colon Cancer Screen: 09/01/2022 repeat in 3 years, Cirigliano,  father with colon cancer      Smoking Status: Quit in last 2 years, now vaping ETOH/ drug use:  Hep C:  refused  Medicare annual wellness visit, subsequent  Essential hypertension Assessment & Plan: Stable, chronic.  Continue current medication.   Metoprolol XL 50 mg daily  Entresto daily   Mixed hyperlipidemia Assessment & Plan: Well-controlled at last check on Zetia 10 mg daily and Crestor 40 mg daily,  Tricor 45 mg p.o. daily  LDL goal < 70 give CAD   Heart failure with reduced ejection fraction [facility]) Assessment & Plan: Echo October 2015 EF 40 to 45%, repeat echo is similar in December 2020. Followed by Dr. Eldridge Dace, cardiology..  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    Prediabetes Assessment & Plan:  A1C 6.4 on [facility] labs.   Trying to get back to exercise and working on weight loss.      Return in about 1 year (around 11/10/2023) for phone AMW,  fasting labs then CPE with me.   Kerby Nora, MD

## 2022-11-10 NOTE — Assessment & Plan Note (Signed)
Echo October 2015 EF 40 to 45%, repeat echo is similar in December 2020. Followed by Dr. Eldridge Dace, cardiology..  Reviewed last office visit from July 2023 Euvolemic in office today  On Entresto 49/51 mg twice daily

## 2022-11-10 NOTE — Assessment & Plan Note (Signed)
Well-controlled at last check on Zetia 10 mg daily and Crestor 40 mg daily,  Tricor 45 mg p.o. daily  LDL goal < 70 give CAD

## 2022-11-10 NOTE — Assessment & Plan Note (Signed)
A1C 6.4 on [facility] labs.   Trying to get back to exercise and working on weight loss.

## 2022-11-10 NOTE — Assessment & Plan Note (Signed)
Followed by Onc , Dr Arbutus Ped status post right lower lobectomy with lymph node sampling on January 16, 2021 with a tumor size of 2.0 cm.

## 2022-11-10 NOTE — Assessment & Plan Note (Signed)
Stable, chronic.  Continue current medication.   Metoprolol XL 50 mg daily  Entresto daily

## 2022-11-10 NOTE — Addendum Note (Signed)
Addended by: Damita Lack on: 11/10/2022 11:17 AM   Modules accepted: Orders

## 2022-11-14 ENCOUNTER — Other Ambulatory Visit: Payer: Self-pay | Admitting: Interventional Cardiology

## 2022-11-30 DIAGNOSIS — L905 Scar conditions and fibrosis of skin: Secondary | ICD-10-CM | POA: Diagnosis not present

## 2022-11-30 DIAGNOSIS — D225 Melanocytic nevi of trunk: Secondary | ICD-10-CM | POA: Diagnosis not present

## 2022-11-30 DIAGNOSIS — L821 Other seborrheic keratosis: Secondary | ICD-10-CM | POA: Diagnosis not present

## 2022-11-30 DIAGNOSIS — D0439 Carcinoma in situ of skin of other parts of face: Secondary | ICD-10-CM | POA: Diagnosis not present

## 2022-11-30 DIAGNOSIS — D171 Benign lipomatous neoplasm of skin and subcutaneous tissue of trunk: Secondary | ICD-10-CM | POA: Diagnosis not present

## 2022-11-30 DIAGNOSIS — Z85828 Personal history of other malignant neoplasm of skin: Secondary | ICD-10-CM | POA: Diagnosis not present

## 2022-11-30 DIAGNOSIS — L570 Actinic keratosis: Secondary | ICD-10-CM | POA: Diagnosis not present

## 2022-11-30 DIAGNOSIS — D485 Neoplasm of uncertain behavior of skin: Secondary | ICD-10-CM | POA: Diagnosis not present

## 2022-12-22 DIAGNOSIS — D0439 Carcinoma in situ of skin of other parts of face: Secondary | ICD-10-CM | POA: Diagnosis not present

## 2023-01-13 ENCOUNTER — Other Ambulatory Visit: Payer: Self-pay | Admitting: Interventional Cardiology

## 2023-01-13 DIAGNOSIS — Z951 Presence of aortocoronary bypass graft: Secondary | ICD-10-CM

## 2023-01-19 NOTE — Progress Notes (Unsigned)
Cardiology Office Note:  .   Date:  01/20/2023  ID:  Brandon [name], DOB 1955-10-12, MRN [id number] PCP: Excell Seltzer, MD   HeartCare Providers Cardiologist:  Lance Muss, MD {  History of Present Illness: .   Brandon [name] is a 67 y.o. male with a past medical history of hypertension and CAD here for follow-up appointment.  History includes in 2000, angina was back pain but then went to his left arm.  Had MI at that time and later had a CABG with LIMA and 2 radial arteries.  This was done in [employer], [place].  In 2005 he was sent to [facility] due to angina which felt like intermittent back pain.  2015 stent was placed in the radial to RCA.  He has been on Plavix in the past.  His primary tried to stop his Plavix but he prefer to stay on it.  Had a lung tumor 11/2020.  Now lives in [place], [place].  Has been unable to quit smoking over the years.  Using e-cigarettes.  Denies chest pain, dizziness, leg edema, nitroglycerin use, orthopnea, palpitations, PND, and syncope at his last visit 10/22/2021.  He was still walking regularly.  No cardiac symptoms.  More short of breath since his lung surgery.  However, stable and manageable.  Today, he tells me that his BP was low at his PCP appointment and that was the first time he was told that. His Sister has a BP cuff and he has checked it intermittently stating that he has some low BP at times. Well controlled today. He tells me he has lightheadedness but only when getting up quickly from sitting. Sounds orthostatic in nature. He only drinks 16-32 ounces a day of water. Encouraged him to increase intake and to wear compression as needed. We will update some labs today.  Reports no shortness of breath nor dyspnea on exertion. Reports no chest pain, pressure, or tightness. No edema, orthopnea, PND. Reports no palpitations.   ROS: Pertinent ROS in HPI  Studies Reviewed: Marland Kitchen   EKG Interpretation Date/Time:  Wednesday January 20 2023 10:00:11  EDT Ventricular Rate:  69 PR Interval:  160 QRS Duration:  86 QT Interval:  388 QTC Calculation: 415 R Axis:   269  Text Interpretation: Normal sinus rhythm Right superior axis deviation Nonspecific ST abnormality When compared with ECG of 14-Jan-2021 09:26, QRS axis Shifted left Minimal criteria for Inferior infarct are no longer Present Nonspecific T wave abnormality, improved in Inferior leads Confirmed by Jari Favre [phone]) on 01/20/2023 10:34:23 AM   Echo 04/08/21 IMPRESSIONS     1. Left ventricular ejection fraction, by estimation, is 40 to 45%. The  left ventricle has mildly decreased function. The left ventricle  demonstrates global hypokinesis. The left ventricular internal cavity size  was mildly dilated. Left ventricular  diastolic parameters are consistent with Grade I diastolic dysfunction  (impaired relaxation).   2. Right ventricular systolic function is normal. The right ventricular  size is mildly enlarged.   3. Left atrial size was severely dilated.   4. The mitral valve is normal in structure. No evidence of mitral valve  regurgitation. No evidence of mitral stenosis.   5. The aortic valve is normal in structure. Aortic valve regurgitation is  not visualized. No aortic stenosis is present.   6. The inferior vena cava is normal in size with greater than 50%  respiratory variability, suggesting right atrial pressure of 3 mmHg.   FINDINGS   Left Ventricle: Left ventricular  ejection fraction, by estimation, is 40  to 45%. The left ventricle has mildly decreased function. The left  ventricle demonstrates global hypokinesis. Definity contrast agent was  given IV to delineate the left ventricular   endocardial borders. The left ventricular internal cavity size was mildly  dilated. There is no left ventricular hypertrophy. Left ventricular  diastolic parameters are consistent with Grade I diastolic dysfunction  (impaired relaxation).     LV Wall Scoring:  The  basal inferolateral segment, apical lateral segment, and mid  anterolateral segment are akinetic.   Right Ventricle: The right ventricular size is mildly enlarged. No  increase in right ventricular wall thickness. Right ventricular systolic  function is normal.   Left Atrium: Left atrial size was severely dilated.   Right Atrium: Right atrial size was normal in size.   Pericardium: There is no evidence of pericardial effusion.   Mitral Valve: The mitral valve is normal in structure. No evidence of  mitral valve regurgitation. No evidence of mitral valve stenosis.   Tricuspid Valve: The tricuspid valve is normal in structure. Tricuspid  valve regurgitation is mild . No evidence of tricuspid stenosis.   Aortic Valve: The aortic valve is normal in structure. Aortic valve  regurgitation is not visualized. No aortic stenosis is present.   Pulmonic Valve: The pulmonic valve was normal in structure. Pulmonic valve  regurgitation is not visualized. No evidence of pulmonic stenosis.   Aorta: The aortic root is normal in size and structure.   Venous: The inferior vena cava is normal in size with greater than 50%  respiratory variability, suggesting right atrial pressure of 3 mmHg.   IAS/Shunts: No atrial level shunt detected by color flow Doppler.       Physical Exam:   VS:  BP 122/68   Pulse 69   Ht 5\' 8"  (1.727 m)   Wt 213 lb 3.2 oz (96.7 kg)   SpO2 95%   BMI 32.42 kg/m    Wt Readings from Last 3 Encounters:  01/20/23 213 lb 3.2 oz (96.7 kg)  11/10/22 208 lb 8 oz (94.6 kg)  09/24/22 207 lb 9.6 oz (94.2 kg)    GEN: Well nourished, well developed in no acute distress NECK: No JVD; No carotid bruits CARDIAC: RRR, no murmurs, rubs, gallops RESPIRATORY:  Clear to auscultation without rales, wheezing or rhonchi  ABDOMEN: Soft, non-tender, non-distended EXTREMITIES:  trace bilateral LE edema; No deformity   ASSESSMENT AND PLAN: .   1.  CAD status post CABG -no issues with  other medications -Continue current medications which include aspirin 81 mg daily, Plavix 75 mg daily, Zetia 10 mg daily, fenofibrate 145 mg daily, metoprolol succinate 50 mg daily, nitroglycerin as needed, Crestor 40 mg daily, Entresto 49-51 mg 2 times a day  2.  Chronic systolic heart failure -Entresto-appointment with PharmD for coverage -For now, continue current medication regimen -Patient is euvolemic on exam other than some trace lower extremity edema, compression socks were recommended  3.  Hypertension -well controlled today -Will continue monitoring at home, encouraged buying his own blood pressure cuff -Continue low-sodium, heart healthy diet -Increase water intake to 64 ounces daily  4.  Tobacco abuse -e-cigarettes , 2.4 nicotine -he uses it regularly  5.  HLD -No recent lipid panel ordered, will order today -Will also check a BMP since he is on Entresto       Dispo: He can f/u in 6 months with Dr. Lynnette Caffey  Signed, Sharlene Dory, PA-C

## 2023-01-20 ENCOUNTER — Ambulatory Visit: Payer: Medicare HMO | Admitting: Interventional Cardiology

## 2023-01-20 ENCOUNTER — Ambulatory Visit: Payer: Medicare HMO | Attending: Interventional Cardiology | Admitting: Physician Assistant

## 2023-01-20 ENCOUNTER — Encounter: Payer: Self-pay | Admitting: Physician Assistant

## 2023-01-20 VITALS — BP 122/68 | HR 69 | Ht 68.0 in | Wt 213.2 lb

## 2023-01-20 DIAGNOSIS — Z951 Presence of aortocoronary bypass graft: Secondary | ICD-10-CM | POA: Diagnosis not present

## 2023-01-20 DIAGNOSIS — Z720 Tobacco use: Secondary | ICD-10-CM

## 2023-01-20 DIAGNOSIS — I10 Essential (primary) hypertension: Secondary | ICD-10-CM

## 2023-01-20 DIAGNOSIS — I25118 Atherosclerotic heart disease of native coronary artery with other forms of angina pectoris: Secondary | ICD-10-CM

## 2023-01-20 DIAGNOSIS — E785 Hyperlipidemia, unspecified: Secondary | ICD-10-CM | POA: Diagnosis not present

## 2023-01-20 NOTE — Patient Instructions (Signed)
Medication Instructions:   Your physician recommends that you continue on your current medications as directed. Please refer to the Current Medication list given to you today.   *If you need a refill on your cardiac medications before your next appointment, please call your pharmacy*   Lab Work:  NONE ORDERED  TODAY    If you have labs (blood work) drawn today and your tests are completely normal, you will receive your results only by: MyChart Message (if you have MyChart) OR A paper copy in the mail If you have any lab test that is abnormal or we need to change your treatment, we will call you to review the results.   Testing/Procedures: NONE ORDERED  TODAY    Follow-Up: At [facility], you and your health needs are our priority.  As part of our continuing mission to provide you with exceptional heart care, we have created designated Provider Care Teams.  These Care Teams include your primary Cardiologist (physician) and Advanced Practice Providers (APPs -  Physician Assistants and Nurse Practitioners) who all work together to provide you with the care you need, when you need it.  We recommend signing up for the patient portal called "MyChart".  Sign up information is provided on this After Visit Summary.  MyChart is used to connect with patients for Virtual Visits (Telemedicine).  Patients are able to view lab/test results, encounter notes, upcoming appointments, etc.  Non-urgent messages can be sent to your provider as well.   To learn more about what you can do with MyChart, go to [email or web address].    Your next appointment:  PHARM D 2  MONTH ( ENTRESTO MANAGEMENT) 6 month(s)  Provider:  Lynnette Caffey   Other Instructions  Heart-Healthy Eating Plan Eating a healthy diet is important for the health of your heart. A heart-healthy eating plan includes: Eating less unhealthy fats. Eating more healthy fats. Eating less salt in your food. Salt is also called  sodium. Making other changes in your diet. Talk with your doctor or a diet specialist (dietitian) to create an eating plan that is right for you. What is my plan? Your doctor may recommend an eating plan that includes: Total fat: ______% or less of total calories a day. Saturated fat: ______% or less of total calories a day. Cholesterol: less than _________mg a day. Sodium: less than _________mg a day. What are tips for following this plan? Cooking Avoid frying your food. Try to bake, boil, grill, or broil it instead. You can also reduce fat by: Removing the skin from poultry. Removing all visible fats from meats. Steaming vegetables in water or broth. Meal planning  At meals, divide your plate into four equal parts: Fill one-half of your plate with vegetables and green salads. Fill one-fourth of your plate with whole grains. Fill one-fourth of your plate with lean protein foods. Eat 2-4 cups of vegetables per day. One cup of vegetables is: 1 cup (91 g) broccoli or cauliflower florets. 2 medium carrots. 1 large bell pepper. 1 large sweet potato. 1 large tomato. 1 medium white potato. 2 cups (150 g) raw leafy greens. Eat 1-2 cups of fruit per day. One cup of fruit is: 1 small apple 1 large banana 1 cup (237 g) mixed fruit, 1 large orange,  cup (82 g) dried fruit, 1 cup (240 mL) 100% fruit juice. Eat more foods that have soluble fiber. These are apples, broccoli, carrots, beans, peas, and barley. Try to get 20-30 g of fiber per day. Eat  4-5 servings of nuts, legumes, and seeds per week: 1 serving of dried beans or legumes equals  cup (90 g) cooked. 1 serving of nuts is  oz (12 almonds, 24 pistachios, or 7 walnut halves). 1 serving of seeds equals  oz (8 g). General information Eat more home-cooked food. Eat less restaurant, buffet, and fast food. Limit or avoid alcohol. Limit foods that are high in starch and sugar. Avoid fried foods. Lose weight if you are  overweight. Keep track of how much salt (sodium) you eat. This is important if you have high blood pressure. Ask your doctor to tell you more about this. Try to add vegetarian meals each week. Fats Choose healthy fats. These include olive oil and canola oil, flaxseeds, walnuts, almonds, and seeds. Eat more omega-3 fats. These include salmon, mackerel, sardines, tuna, flaxseed oil, and ground flaxseeds. Try to eat fish at least 2 times each week. Check food labels. Avoid foods with trans fats or high amounts of saturated fat. Limit saturated fats. These are often found in animal products, such as meats, butter, and cream. These are also found in plant foods, such as palm oil, palm kernel oil, and coconut oil. Avoid foods with partially hydrogenated oils in them. These have trans fats. Examples are stick margarine, some tub margarines, cookies, crackers, and other baked goods. What foods should I eat? Fruits All fresh, canned (in natural juice), or frozen fruits. Vegetables Fresh or frozen vegetables (raw, steamed, roasted, or grilled). Green salads. Grains Most grains. Choose whole wheat and whole grains most of the time. Rice and pasta, including brown rice and pastas made with whole wheat. Meats and other proteins Lean, well-trimmed beef, veal, pork, and lamb. Chicken and [place] without skin. All fish and shellfish. Wild duck, rabbit, pheasant, and venison. Egg whites or low-cholesterol egg substitutes. Dried beans, peas, lentils, and tofu. Seeds and most nuts. Dairy Low-fat or nonfat cheeses, including ricotta and mozzarella. Skim or 1% milk that is liquid, powdered, or evaporated. Buttermilk that is made with low-fat milk. Nonfat or low-fat yogurt. Fats and oils Non-hydrogenated (trans-free) margarines. Vegetable oils, including soybean, sesame, sunflower, olive, peanut, safflower, corn, canola, and cottonseed. Salad dressings or mayonnaise made with a vegetable oil. Beverages Mineral  water. Coffee and tea. Diet carbonated beverages. Sweets and desserts Sherbet, gelatin, and fruit ice. Small amounts of dark chocolate. Limit all sweets and desserts. Seasonings and condiments All seasonings and condiments. The items listed above may not be a complete list of foods and drinks you can eat. Contact a dietitian for more options. What foods should I avoid? Fruits Canned fruit in heavy syrup. Fruit in cream or butter sauce. Fried fruit. Limit coconut. Vegetables Vegetables cooked in cheese, cream, or butter sauce. Fried vegetables. Grains Breads that are made with saturated or trans fats, oils, or whole milk. Croissants. Sweet rolls. Donuts. High-fat crackers, such as cheese crackers. Meats and other proteins Fatty meats, such as hot dogs, ribs, sausage, bacon, rib-eye roast or steak. High-fat deli meats, such as salami and bologna. Caviar. Domestic duck and goose. Organ meats, such as liver. Dairy Cream, sour cream, cream cheese, and creamed cottage cheese. Whole-milk cheeses. Whole or 2% milk that is liquid, evaporated, or condensed. Whole buttermilk. Cream sauce or high-fat cheese sauce. Yogurt that is made from whole milk. Fats and oils Meat fat, or shortening. Cocoa butter, hydrogenated oils, palm oil, coconut oil, palm kernel oil. Solid fats and shortenings, including bacon fat, salt pork, lard, and butter. Nondairy cream substitutes.  Salad dressings with cheese or sour cream. Beverages Regular sodas and juice drinks with added sugar. Sweets and desserts Frosting. Pudding. Cookies. Cakes. Pies. Milk chocolate or white chocolate. Buttered syrups. Full-fat ice cream or ice cream drinks. The items listed above may not be a complete list of foods and drinks to avoid. Contact a dietitian for more information. Summary Heart-healthy meal planning includes eating less unhealthy fats, eating more healthy fats, and making other changes in your diet. Eat a balanced diet. This  includes fruits and vegetables, low-fat or nonfat dairy, lean protein, nuts and legumes, whole grains, and heart-healthy oils and fats. This information is not intended to replace advice given to you by your health care provider. Make sure you discuss any questions you have with your health care provider. Document Revised: 05/12/2021 Document Reviewed: 05/12/2021 Elsevier Patient Education  2024 [employer]  Low-Sodium Eating Plan Salt (sodium) helps you keep a healthy balance of fluids in your body. Too much sodium can raise your blood pressure. It can also cause fluid and waste to be held in your body. Your health care provider or dietitian may recommend a low-sodium eating plan if you have high blood pressure (hypertension), kidney disease, liver disease, or heart failure. Eating less sodium can help lower your blood pressure and reduce swelling. It can also protect your heart, liver, and kidneys. What are tips for following this plan? Reading food labels  Check food labels for the amount of sodium per serving. If you eat more than one serving, you must multiply the listed amount by the number of servings. Choose foods with less than 140 milligrams (mg) of sodium per serving. Avoid foods with 300 mg of sodium or more per serving. Always check how much sodium is in a product, even if the label says "unsalted" or "no salt added." Shopping  Buy products labeled as "low-sodium" or "no salt added." Buy fresh foods. Avoid canned foods and pre-made or frozen meals. Avoid canned, cured, or processed meats. Buy breads that have less than 80 mg of sodium per slice. Cooking  Eat more home-cooked food. Try to eat less restaurant, buffet, and fast food. Try not to add salt when you cook. Use salt-free seasonings or herbs instead of table salt or sea salt. Check with your provider or pharmacist before using salt substitutes. Cook with plant-based oils, such as canola, sunflower, or olive oil. Meal  planning When eating at a restaurant, ask if your food can be made with less salt or no salt. Avoid dishes labeled as brined, pickled, cured, or smoked. Avoid dishes made with soy sauce, miso, or teriyaki sauce. Avoid foods that have monosodium glutamate (MSG) in them. MSG may be added to some restaurant food, sauces, soups, bouillon, and canned foods. Make meals that can be grilled, baked, poached, roasted, or steamed. These are often made with less sodium. General information Try to limit your sodium intake to 1,500-2,300 mg each day, or the amount told by your provider. What foods should I eat? Fruits Fresh, frozen, or canned fruit. Fruit juice. Vegetables Fresh or frozen vegetables. "No salt added" canned vegetables. "No salt added" tomato sauce and paste. Low-sodium or reduced-sodium tomato and vegetable juice. Grains Low-sodium cereals, such as oats, puffed wheat and rice, and shredded wheat. Low-sodium crackers. Unsalted rice. Unsalted pasta. Low-sodium bread. Whole grain breads and whole grain pasta. Meats and other proteins Fresh or frozen meat, poultry, seafood, and fish. These should have no added salt. Low-sodium canned tuna and salmon. Unsalted  nuts. Dried peas, beans, and lentils without added salt. Unsalted canned beans. Eggs. Unsalted nut butters. Dairy Milk. Soy milk. Cheese that is naturally low in sodium, such as ricotta cheese, fresh mozzarella, or Swiss cheese. Low-sodium or reduced-sodium cheese. Cream cheese. Yogurt. Seasonings and condiments Fresh and dried herbs and spices. Salt-free seasonings. Low-sodium mustard and ketchup. Sodium-free salad dressing. Sodium-free light mayonnaise. Fresh or refrigerated horseradish. Lemon juice. Vinegar. Other foods Homemade, reduced-sodium, or low-sodium soups. Unsalted popcorn and pretzels. Low-salt or salt-free chips. The items listed above may not be all the foods and drinks you can have. Talk to a dietitian to learn more. What  foods should I avoid? Vegetables Sauerkraut, pickled vegetables, and relishes. Olives. [place] fries. Onion rings. Regular canned vegetables, except low-sodium or reduced-sodium items. Regular canned tomato sauce and paste. Regular tomato and vegetable juice. Frozen vegetables in sauces. Grains Instant hot cereals. Bread stuffing, pancake, and biscuit mixes. Croutons. Seasoned rice or pasta mixes. Noodle soup cups. Boxed or frozen macaroni and cheese. Regular salted crackers. Self-rising flour. Meats and other proteins Meat or fish that is salted, canned, smoked, spiced, or pickled. Precooked or cured meat, such as sausages or meat loaves. Tomasa Blase. Ham. Pepperoni. Hot dogs. Corned beef. Chipped beef. Salt pork. Jerky. Pickled herring, anchovies, and sardines. Regular canned tuna. Salted nuts. Dairy Processed cheese and cheese spreads. Hard cheeses. Cheese curds. Blue cheese. Feta cheese. String cheese. Regular cottage cheese. Buttermilk. Canned milk. Fats and oils Salted butter. Regular margarine. Ghee. Bacon fat. Seasonings and condiments Onion salt, garlic salt, seasoned salt, table salt, and sea salt. Canned and packaged gravies. Worcestershire sauce. Tartar sauce. Barbecue sauce. Teriyaki sauce. Soy sauce, including reduced-sodium soy sauce. Steak sauce. Fish sauce. Oyster sauce. Cocktail sauce. Horseradish that you find on the shelf. Regular ketchup and mustard. Meat flavorings and tenderizers. Bouillon cubes. Hot sauce. Pre-made or packaged marinades. Pre-made or packaged taco seasonings. Relishes. Regular salad dressings. Salsa. Other foods Salted popcorn and pretzels. Corn chips and puffs. Potato and tortilla chips. Canned or dried soups. Pizza. Frozen entrees and pot pies. The items listed above may not be all the foods and drinks you should avoid. Talk to a dietitian to learn more. This information is not intended to replace advice given to you by your health care provider. Make sure you  discuss any questions you have with your health care provider. Document Revised: 04/23/2022 Document Reviewed: 04/23/2022 Elsevier Patient Education  2024 [employer].

## 2023-01-21 LAB — BASIC METABOLIC PANEL
BUN/Creatinine Ratio: 14 (ref 10–24)
BUN: 18 mg/dL (ref 8–27)
CO2: 25 mmol/L (ref 20–29)
Calcium: 9.9 mg/dL (ref 8.6–10.2)
Chloride: 101 mmol/L (ref 96–106)
Creatinine, Ser: 1.27 mg/dL (ref 0.76–1.27)
Glucose: 110 mg/dL — ABNORMAL HIGH (ref 70–99)
Potassium: 5.3 mmol/L — ABNORMAL HIGH (ref 3.5–5.2)
Sodium: 138 mmol/L (ref 134–144)
eGFR: 61 mL/min/{1.73_m2} (ref >59)

## 2023-01-21 LAB — LIPID PANEL
Chol/HDL Ratio: 3.2 {ratio} (ref 0.0–5.0)
Cholesterol, Total: 139 mg/dL (ref 100–199)
HDL: 43 mg/dL (ref >39)
LDL Chol Calc (NIH): 68 mg/dL (ref 0–99)
Triglycerides: 167 mg/dL — ABNORMAL HIGH (ref 0–149)
VLDL Cholesterol Cal: 28 mg/dL (ref 5–40)

## 2023-01-21 LAB — HEPATIC FUNCTION PANEL
ALT: 19 [IU]/L (ref 0–44)
AST: 17 [IU]/L (ref 0–40)
Albumin: 4.6 g/dL (ref 3.9–4.9)
Alkaline Phosphatase: 41 [IU]/L — ABNORMAL LOW (ref 44–121)
Bilirubin Total: 0.4 mg/dL (ref 0.0–1.2)
Bilirubin, Direct: 0.14 mg/dL (ref 0.00–0.40)
Total Protein: 6.5 g/dL (ref 6.0–8.5)

## 2023-01-28 ENCOUNTER — Other Ambulatory Visit: Payer: Self-pay | Admitting: Interventional Cardiology

## 2023-02-02 ENCOUNTER — Other Ambulatory Visit: Payer: Self-pay | Admitting: Nurse Practitioner

## 2023-02-09 ENCOUNTER — Telehealth: Payer: Self-pay

## 2023-02-09 ENCOUNTER — Ambulatory Visit: Payer: Medicare HMO | Attending: Physician Assistant

## 2023-02-09 DIAGNOSIS — E875 Hyperkalemia: Secondary | ICD-10-CM

## 2023-02-09 DIAGNOSIS — Z79899 Other long term (current) drug therapy: Secondary | ICD-10-CM

## 2023-02-09 NOTE — Telephone Encounter (Signed)
BMET ordered for K+ abnormalities per T. Asa Lente, found on 01/20/23 labs.

## 2023-02-10 LAB — BASIC METABOLIC PANEL
BUN/Creatinine Ratio: 15 (ref 10–24)
BUN: 21 mg/dL (ref 8–27)
CO2: 26 mmol/L (ref 20–29)
Calcium: 9.5 mg/dL (ref 8.6–10.2)
Chloride: 102 mmol/L (ref 96–106)
Creatinine, Ser: 1.41 mg/dL — ABNORMAL HIGH (ref 0.76–1.27)
Glucose: 118 mg/dL — ABNORMAL HIGH (ref 70–99)
Potassium: 5.5 mmol/L — ABNORMAL HIGH (ref 3.5–5.2)
Sodium: 137 mmol/L (ref 134–144)
eGFR: 54 mL/min/{1.73_m2} — ABNORMAL LOW (ref >59)

## 2023-02-10 NOTE — Addendum Note (Signed)
Addended by: Macie Burows on: 02/10/2023 11:22 AM   Modules accepted: Orders

## 2023-02-10 NOTE — Telephone Encounter (Signed)
Called pt advised of providers recommendation: Patient potassium is increasing and now 5.5.  With his creatinine also increasing to 1.4.  I think there is likely a level of dehydration involved.  I will ask him to drink 64 ounces of water a day and continue to hold any potassium supplements or sports drinks.  Please repeat be met tomorrow and if potassium is still high we will consider adding Lasix.  I will send a copy to Dr. Lynnette Caffey.  Thanks Sharlene Dory, PA-C   Pt reports no longer takes multivitamin. Drinks about a bottle and a half of water daily, 2 cups of coffee and 3-4 12 oz beers nightly. Advised to increase water intake.  Will come in for repeat labs tomorrow.  Orders placed and released for draw.

## 2023-02-11 ENCOUNTER — Other Ambulatory Visit: Payer: Self-pay | Admitting: Interventional Cardiology

## 2023-02-11 DIAGNOSIS — E875 Hyperkalemia: Secondary | ICD-10-CM | POA: Diagnosis not present

## 2023-02-11 LAB — BASIC METABOLIC PANEL
BUN/Creatinine Ratio: 14 (ref 10–24)
BUN: 16 mg/dL (ref 8–27)
CO2: 23 mmol/L (ref 20–29)
Calcium: 9.3 mg/dL (ref 8.6–10.2)
Chloride: 101 mmol/L (ref 96–106)
Creatinine, Ser: 1.16 mg/dL (ref 0.76–1.27)
Glucose: 116 mg/dL — ABNORMAL HIGH (ref 70–99)
Potassium: 5.1 mmol/L (ref 3.5–5.2)
Sodium: 136 mmol/L (ref 134–144)
eGFR: 68 mL/min/{1.73_m2} (ref >59)

## 2023-03-11 ENCOUNTER — Other Ambulatory Visit: Payer: Self-pay | Admitting: Interventional Cardiology

## 2023-03-18 ENCOUNTER — Other Ambulatory Visit: Payer: Self-pay | Admitting: Interventional Cardiology

## 2023-03-24 ENCOUNTER — Ambulatory Visit: Payer: Medicare HMO

## 2023-04-18 ENCOUNTER — Other Ambulatory Visit: Payer: Self-pay | Admitting: Interventional Cardiology

## 2023-05-02 ENCOUNTER — Other Ambulatory Visit: Payer: Self-pay | Admitting: Interventional Cardiology

## 2023-05-02 ENCOUNTER — Other Ambulatory Visit: Payer: Self-pay | Admitting: Internal Medicine

## 2023-05-02 DIAGNOSIS — Z951 Presence of aortocoronary bypass graft: Secondary | ICD-10-CM

## 2023-05-04 DIAGNOSIS — L821 Other seborrheic keratosis: Secondary | ICD-10-CM | POA: Diagnosis not present

## 2023-05-04 DIAGNOSIS — D485 Neoplasm of uncertain behavior of skin: Secondary | ICD-10-CM | POA: Diagnosis not present

## 2023-05-04 DIAGNOSIS — D0339 Melanoma in situ of other parts of face: Secondary | ICD-10-CM | POA: Diagnosis not present

## 2023-05-18 ENCOUNTER — Encounter: Payer: Self-pay | Admitting: Family Medicine

## 2023-05-18 NOTE — Telephone Encounter (Signed)
Error

## 2023-05-24 ENCOUNTER — Telehealth: Payer: Self-pay | Admitting: Internal Medicine

## 2023-05-24 NOTE — Telephone Encounter (Signed)
Reviewed chart and office visit notes, no change in Entresto dose noted.  From last OV note with Jari Favre, PA-C on 01/20/2023: 1.  CAD status post CABG -no issues with other medications -Continue current medications which include aspirin 81 mg daily, Plavix 75 mg daily, Zetia 10 mg daily, fenofibrate 145 mg daily, metoprolol succinate 50 mg daily, nitroglycerin as needed, Crestor 40 mg daily, Entresto 49-51 mg 2 times a day  Patient to continue Entresto 49-51mg  BID. Instructed patient that he can take 2 tablets of the 24-26mg  tablets twice daily until he runs out and a new Rx for 49-51mg  tablets will be sent to [facility] pharmacy with a 90 day fill (as requested by patient) when he starts running low on current fill.  Patient verbalized understanding and expressed appreciation for call. Med list updated with correct dose of Entresto.

## 2023-05-24 NOTE — Telephone Encounter (Signed)
Pt c/o medication issue:  1. Name of Medication:   sacubitril-valsartan (ENTRESTO) 49-51 MG  2. How are you currently taking this medication (dosage and times per day)?   3. Are you having a reaction (difficulty breathing--STAT)?   4. What is your medication issue?   Patient states he normally takes the 49-51 MG dose and he went to the pharmacy and picked up the 24-26 MG dose. He states he didn't realize it wasn't the correct dose until he got home and he would like to know if it has been changed. He states he doesn't remember it being decreased.

## 2023-05-25 ENCOUNTER — Encounter: Payer: Self-pay | Admitting: Physician Assistant

## 2023-05-26 ENCOUNTER — Other Ambulatory Visit: Payer: Self-pay

## 2023-05-26 MED ORDER — ENTRESTO 49-51 MG PO TABS: 1.0000 | ORAL_TABLET | Freq: Two times a day (BID) | ORAL | 2 refills | Status: DC

## 2023-05-31 DIAGNOSIS — D0330 Melanoma in situ of unspecified part of face: Secondary | ICD-10-CM | POA: Diagnosis not present

## 2023-05-31 DIAGNOSIS — L988 Other specified disorders of the skin and subcutaneous tissue: Secondary | ICD-10-CM | POA: Diagnosis not present

## 2023-07-05 NOTE — Progress Notes (Signed)
 Cardiology Office Note:   Date:  07/15/2023  ID:  Brandon [name], DOB [birth year], MRN [id number] PCP:  Excell Seltzer, MD  [facility] Providers Cardiologist:  Alverda Skeans, MD Referring MD: Excell Seltzer, MD  Chief Complaint/Reason for Referral: Follow-up for coronary artery disease ASSESSMENT:    1. Hx of CABG   2. Essential hypertension   3. Hyperlipidemia LDL goal <70   4. Aortic atherosclerosis (HCC)   5. CKD (chronic kidney disease) stage 2, GFR 60-89 ml/min   6. Diastolic dysfunction   7. Ischemic cardiomyopathy   8. Tobacco abuse     PLAN:   In order of problems listed above: History of CABG: Will stop aspirin 81 mg continue Plavix 75 mg indefinitely.  Continue Zetia 10 mg, Tricor 145 mg, Crestor 40 mg.  Continue as needed nitroglycerin and decrease Toprol in half.  Patient unsure if he is on 50 mg or 25 mg of Toprol.  I told him what ever dose it is to cut it in half.  He will let us know what dose he is on when he gets home. Hypertension: Continue Entresto 49 x 51 mg and reduced dose of Toprol as above. Hyperlipidemia: LDL was at goal in October.  Check LP(a) today. Aortic atherosclerosis: Continue Plavix 75 mg daily, Zetia 10 mg daily, and rosuvastatin 40 mg daily; pursue optimal blood pressure control. CKD stage II: Continue Entresto 49 x 51 mg twice daily; Jardiance 10 mg daily Diastolic dysfunction: Continue Entresto 49 x 51 mg daily and start Jardiance 10 mg daily. Ischemic cardiomyopathy: Continue Entresto 49 x 51 mg daily, decrease Toprol to half dose as above, and start Jardiance 10 mg daily.  Will obtain echocardiogram to evaluate EF in 3 months. Tobacco abuse: Had reassuring abdominal ultrasound in 2023.  No symptoms of claudication.  Could consider screening carotid ultrasound in the future.            Dispo:  Return in about 6 months (around 01/15/2024).      Medication Adjustments/Labs and Tests Ordered: Current medicines are reviewed at length with  the patient today.  Concerns regarding medicines are outlined above.  The following changes have been made:     Labs/tests ordered: Orders Placed This Encounter  Procedures   Lipoprotein A (LPA)   Hemoglobin A1c   ECHOCARDIOGRAM COMPLETE    Medication Changes: Meds ordered this encounter  Medications   empagliflozin (JARDIANCE) 10 MG TABS tablet    Sig: Take 1 tablet (10 mg total) by mouth daily before breakfast.    Dispense:  90 tablet    Refill:  3   empagliflozin (JARDIANCE) 10 MG TABS tablet    Sig: Take 1 tablet (10 mg total) by mouth daily before breakfast.    Dispense:  28 tablet    Lot Number?:   [id number]    Expiration Date?:   04/19/2025    Current medicines are reviewed at length with the patient today.  The patient does not have concerns regarding medicines.  I spent 38 minutes reviewing all clinical data during and prior to this visit including all relevant imaging studies, laboratories, clinical information from other health systems and prior notes from both Cardiology and other specialties, interviewing the patient, conducting a complete physical examination, and coordinating care in order to formulate a comprehensive and personalized evaluation and treatment plan.   History of Present Illness:      FOCUSED PROBLEM LIST:   CAD LIMA to LAD, radial to RCA, and radial  to OM versus diagonal(?) [facility] PCI unknown artery 2005 PCI radial to RCA graft 2015 Hypertension Hyperlipidemia Aortic atherosclerosis Chest CT 2023 CKD stage II Lung cancer Status post resection Cardiomyopathy EF of 40 to 45% without valve problems TTE 2022 Diastolic dysfunction G1 DD, no significant valve problems, EF 40 to 45% TTE 2022 Tobacco abuse Abdominal ultrasound without AAA 2023 BMI 32  March 2025:  Patient consents to use of AI scribe. The patient returns for routine follow-up.  He was last seen in October 2024 and he was doing well with a well-controlled blood pressure.   His medications were continued without changes.  A lipid panel was checked and demonstrated LDL of 68.  He reports a recent episode of lightheadedness lasting about two weeks, which he associates with high potassium levels from multivitamin use. After discontinuing the multivitamins, the symptoms have mostly resolved, though he occasionally feels 'a little wobbly' upon standing. His blood pressure has been lower than usual, with systolic readings in the mid-90s to 110s and diastolic readings consistently between 60-65. No chest pain, breathing difficulties, leg pain when walking, or symptoms of stroke such as difficulty moving arms or legs or vision changes.  He is currently on medications for high blood pressure, high cholesterol, and heart muscle weakness, including metoprolol and Entresto. He is on Plavix and was previously on aspirin, which has been stopped. He recalls a change in his metoprolol dosage from 25 mg to 50 mg in 2022, but he is unsure of his current dose and needs to verify his medication dosage at home.  He uses a vape pen and has a history of tobacco use. He mentions weight gain over the winter due to decreased physical activity, partly because of hip pain that limits his walking. He has been informed of being 'close' to diabetes, which prompted him to start walking, but hip pain has hindered his efforts.         Current Medications: Current Meds  Medication Sig   clopidogrel (PLAVIX) 75 MG tablet TAKE 1 TABLET BY MOUTH EVERY DAY   empagliflozin (JARDIANCE) 10 MG TABS tablet Take 1 tablet (10 mg total) by mouth daily before breakfast.   empagliflozin (JARDIANCE) 10 MG TABS tablet Take 1 tablet (10 mg total) by mouth daily before breakfast.   ezetimibe (ZETIA) 10 MG tablet *NEED OFFICE VISIT FOR REFFILL* TAKE 1 TABLET BY MOUTH EVERY DAY   fenofibrate (TRICOR) 145 MG tablet TAKE 1 TABLET BY MOUTH EVERY DAY   metoprolol succinate (TOPROL-XL) 50 MG 24 hr tablet TAKE 1 TABLET BY  MOUTH EVERY DAY WITH OR IMMEDIATELY FOLLOWING A MEAL   Multiple Vitamin (MULTIVITAMIN) tablet Take 1 tablet by mouth daily.   nitroGLYCERIN (NITROSTAT) 0.4 MG SL tablet Place 1 tablet (0.4 mg total) under the tongue every 5 (five) minutes as needed for chest pain.   rosuvastatin (CRESTOR) 40 MG tablet Take 1 tablet (40 mg total) by mouth daily. TAKE 1 TABLET BY MOUTH EVERY DAY   sacubitril-valsartan (ENTRESTO) 49-51 MG Take 1 tablet by mouth 2 (two) times daily.   [DISCONTINUED] aspirin EC 81 MG tablet Take 1 tablet (81 mg total) by mouth daily.     Review of Systems:   Please see the history of present illness.    All other systems reviewed and are negative.     EKGs/Labs/Other Test Reviewed:   EKG: EKG performed October 2024 demonstrates normal sinus rhythm with nonspecific ST and T wave changes  EKG Interpretation Date/Time:  Ventricular Rate:    PR Interval:    QRS Duration:    QT Interval:    QTC Calculation:   R Axis:      Text Interpretation:           Risk Assessment/Calculations:          Physical Exam:   VS:  BP 92/60   Pulse 64   Ht 5\' 8"  (1.727 m)   Wt 217 lb 9.6 oz (98.7 kg)   SpO2 98%   BMI 33.09 kg/m        Wt Readings from Last 3 Encounters:  07/15/23 217 lb 9.6 oz (98.7 kg)  01/20/23 213 lb 3.2 oz (96.7 kg)  11/10/22 208 lb 8 oz (94.6 kg)      GENERAL:  No apparent distress, AOx3 HEENT:  No carotid bruits, +2 carotid impulses, no scleral icterus CAR: RRR no murmurs, gallops, rubs, or thrills RES:  Clear to auscultation bilaterally ABD:  Soft, nontender, nondistended, positive bowel sounds x 4 VASC:  +2 radial pulses, +2 carotid pulses NEURO:  CN 2-12 grossly intact; motor and sensory grossly intact PSYCH:  No active depression or anxiety EXT:  No edema, ecchymosis, or cyanosis  Signed, Orbie Pyo, MD  07/15/2023 4:18 PM    [facility] [address] [place], [place], [place]  [address] Phone: [phone]; Fax:  [phone]   Note:  This document was prepared using Dragon voice recognition software and may include unintentional dictation errors.

## 2023-07-15 ENCOUNTER — Encounter: Payer: Self-pay | Admitting: Internal Medicine

## 2023-07-15 ENCOUNTER — Ambulatory Visit: Payer: Medicare HMO | Attending: Internal Medicine | Admitting: Internal Medicine

## 2023-07-15 VITALS — BP 92/60 | HR 64 | Ht 68.0 in | Wt 217.6 lb

## 2023-07-15 DIAGNOSIS — I700 Atherosclerosis of aorta: Secondary | ICD-10-CM

## 2023-07-15 DIAGNOSIS — N182 Chronic kidney disease, stage 2 (mild): Secondary | ICD-10-CM | POA: Diagnosis not present

## 2023-07-15 DIAGNOSIS — I5189 Other ill-defined heart diseases: Secondary | ICD-10-CM

## 2023-07-15 DIAGNOSIS — Z951 Presence of aortocoronary bypass graft: Secondary | ICD-10-CM | POA: Diagnosis not present

## 2023-07-15 DIAGNOSIS — I10 Essential (primary) hypertension: Secondary | ICD-10-CM | POA: Diagnosis not present

## 2023-07-15 DIAGNOSIS — E785 Hyperlipidemia, unspecified: Secondary | ICD-10-CM | POA: Diagnosis not present

## 2023-07-15 DIAGNOSIS — Z720 Tobacco use: Secondary | ICD-10-CM | POA: Diagnosis not present

## 2023-07-15 DIAGNOSIS — I255 Ischemic cardiomyopathy: Secondary | ICD-10-CM

## 2023-07-15 MED ORDER — EMPAGLIFLOZIN 10 MG PO TABS: 10.0000 mg | ORAL_TABLET | Freq: Every day | ORAL | 3 refills | Status: AC

## 2023-07-15 MED ORDER — EMPAGLIFLOZIN 10 MG PO TABS: 10.0000 mg | ORAL_TABLET | Freq: Every day | ORAL | Status: DC

## 2023-07-15 NOTE — Patient Instructions (Addendum)
 Medication Instructions:  Your physician has recommended you make the following change in your medication:   1) STOP aspirin  2) START empagliflozin (Jardiance) 10 mg daily before breakfast  *If you need a refill on your cardiac medications before your next appointment, please call your pharmacy*  Lab Work: TODAY: Lipoprotein A, Hgb A1c If you have labs (blood work) drawn today and your tests are completely normal, you will receive your results only by: MyChart Message (if you have MyChart) OR A paper copy in the mail If you have any lab test that is abnormal or we need to change your treatment, we will call you to review the results.  Testing/Procedures: Your physician has requested that you have an echocardiogram in 3 months. Echocardiography is a painless test that uses sound waves to create images of your heart. It provides your doctor with information about the size and shape of your heart and how well your heart's chambers and valves are working. This procedure takes approximately one hour. There are no restrictions for this procedure. Please do NOT wear cologne, perfume, aftershave, or lotions (deodorant is allowed). Please arrive 15 minutes prior to your appointment time.  Please note: We ask at that you not bring children with you during ultrasound (echo/ vascular) testing. Due to room size and safety concerns, children are not allowed in the ultrasound rooms during exams. Our front office staff cannot provide observation of children in our lobby area while testing is being conducted. An adult accompanying a patient to their appointment will only be allowed in the ultrasound room at the discretion of the ultrasound technician under special circumstances. We apologize for any inconvenience.  Follow-Up: At [facility], you and your health needs are our priority.  As part of our continuing mission to provide you with exceptional heart care, we have created designated Provider Care  Teams.  These Care Teams include your primary Cardiologist (physician) and Advanced Practice Providers (APPs -  Physician Assistants and Nurse Practitioners) who all work together to provide you with the care you need, when you need it.  Your next appointment:   6 month(s)  The format for your next appointment:   In Person  Provider:   Jari Favre, PA-C   Other Instructions   1st Floor: - Lobby - Registration  - Pharmacy  - Lab - Cafe  2nd Floor: - PV Lab - Diagnostic Testing (echo, CT, nuclear med)  3rd Floor: - Vacant  4th Floor: - TCTS (cardiothoracic surgery) - AFib Clinic - Structural Heart Clinic - Vascular Surgery  - Vascular Ultrasound  5th Floor: - HeartCare Cardiology (general and EP) - Clinical Pharmacy for coumadin, hypertension, lipid, weight-loss medications, and med management appointments    Valet parking services will be available as well.

## 2023-07-16 ENCOUNTER — Encounter: Payer: Self-pay | Admitting: Internal Medicine

## 2023-07-16 LAB — HEMOGLOBIN A1C
Est. average glucose Bld gHb Est-mCnc: 134 mg/dL
Hgb A1c MFr Bld: 6.3 % — ABNORMAL HIGH (ref 4.8–5.6)

## 2023-07-16 LAB — LIPOPROTEIN A (LPA): Lipoprotein (a): 70.9 nmol/L (ref <75.0)

## 2023-07-16 MED ORDER — METOPROLOL SUCCINATE ER 25 MG PO TB24: 25.0000 mg | ORAL_TABLET | Freq: Every day | ORAL | 3 refills | Status: AC

## 2023-07-16 NOTE — Telephone Encounter (Signed)
 Orbie Pyo, MD to Serita Sheller R, LPN  Me  AT   1/61/09  8:51 AM Please indicate in chart and have him decrease his dose to 25 mg. __________________________________________    Messaged back to patient advising to decrease metoprolol  25 mg daily.  Medication list updated.

## 2023-09-17 ENCOUNTER — Ambulatory Visit (HOSPITAL_COMMUNITY)
Admission: RE | Admit: 2023-09-17 | Discharge: 2023-09-17 | Disposition: A | Discharge status: Home / Self Care | Source: Ambulatory Visit | Attending: Internal Medicine | Admitting: Internal Medicine

## 2023-09-17 ENCOUNTER — Inpatient Hospital Stay: Payer: Medicare HMO | Attending: Internal Medicine

## 2023-09-17 DIAGNOSIS — C3491 Malignant neoplasm of unspecified part of right bronchus or lung: Secondary | ICD-10-CM | POA: Diagnosis not present

## 2023-09-17 DIAGNOSIS — Z902 Acquired absence of lung [part of]: Secondary | ICD-10-CM | POA: Diagnosis not present

## 2023-09-17 DIAGNOSIS — C3431 Malignant neoplasm of lower lobe, right bronchus or lung: Secondary | ICD-10-CM | POA: Insufficient documentation

## 2023-09-17 DIAGNOSIS — C3490 Malignant neoplasm of unspecified part of unspecified bronchus or lung: Secondary | ICD-10-CM

## 2023-09-17 DIAGNOSIS — Z79899 Other long term (current) drug therapy: Secondary | ICD-10-CM | POA: Diagnosis not present

## 2023-09-17 LAB — CBC WITH DIFFERENTIAL (CANCER CENTER ONLY)
Abs Immature Granulocytes: 0.01 10*3/uL (ref 0.00–0.07)
Basophils Absolute: 0 10*3/uL (ref 0.0–0.1)
Basophils Relative: 1 %
Eosinophils Absolute: 0.1 10*3/uL (ref 0.0–0.5)
Eosinophils Relative: 2 %
HCT: 45.7 % (ref 39.0–52.0)
Hemoglobin: 14.8 g/dL (ref 13.0–17.0)
Immature Granulocytes: 0 %
Lymphocytes Relative: 31 %
Lymphs Abs: 1.7 10*3/uL (ref 0.7–4.0)
MCH: 29.7 pg (ref 26.0–34.0)
MCHC: 32.4 g/dL (ref 30.0–36.0)
MCV: 91.6 fL (ref 80.0–100.0)
Monocytes Absolute: 0.5 10*3/uL (ref 0.1–1.0)
Monocytes Relative: 9 %
Neutro Abs: 3.1 10*3/uL (ref 1.7–7.7)
Neutrophils Relative %: 57 %
Platelet Count: 221 10*3/uL (ref 150–400)
RBC: 4.99 MIL/uL (ref 4.22–5.81)
RDW: 13.1 % (ref 11.5–15.5)
WBC Count: 5.5 10*3/uL (ref 4.0–10.5)
nRBC: 0 % (ref 0.0–0.2)

## 2023-09-17 LAB — CMP (CANCER CENTER ONLY)
ALT: 13 U/L (ref 0–44)
AST: 12 U/L — ABNORMAL LOW (ref 15–41)
Albumin: 4.4 g/dL (ref 3.5–5.0)
Alkaline Phosphatase: 33 U/L — ABNORMAL LOW (ref 38–126)
Anion gap: 4 — ABNORMAL LOW (ref 5–15)
BUN: 18 mg/dL (ref 8–23)
CO2: 30 mmol/L (ref 22–32)
Calcium: 9.2 mg/dL (ref 8.9–10.3)
Chloride: 104 mmol/L (ref 98–111)
Creatinine: 1.41 mg/dL — ABNORMAL HIGH (ref 0.61–1.24)
GFR, Estimated: 54 mL/min — ABNORMAL LOW (ref >60)
Glucose, Bld: 124 mg/dL — ABNORMAL HIGH (ref 70–99)
Potassium: 5.2 mmol/L — ABNORMAL HIGH (ref 3.5–5.1)
Sodium: 138 mmol/L (ref 135–145)
Total Bilirubin: 0.6 mg/dL (ref 0.0–1.2)
Total Protein: 6.7 g/dL (ref 6.5–8.1)

## 2023-09-21 ENCOUNTER — Inpatient Hospital Stay: Payer: Medicare HMO | Attending: Internal Medicine | Admitting: Internal Medicine

## 2023-09-21 VITALS — BP 102/58 | HR 64 | Temp 97.6°F | Resp 18 | Wt 211.1 lb

## 2023-09-21 DIAGNOSIS — Z9049 Acquired absence of other specified parts of digestive tract: Secondary | ICD-10-CM | POA: Insufficient documentation

## 2023-09-21 DIAGNOSIS — Z888 Allergy status to other drugs, medicaments and biological substances status: Secondary | ICD-10-CM | POA: Insufficient documentation

## 2023-09-21 DIAGNOSIS — Z91041 Radiographic dye allergy status: Secondary | ICD-10-CM | POA: Insufficient documentation

## 2023-09-21 DIAGNOSIS — Z85828 Personal history of other malignant neoplasm of skin: Secondary | ICD-10-CM | POA: Insufficient documentation

## 2023-09-21 DIAGNOSIS — I252 Old myocardial infarction: Secondary | ICD-10-CM | POA: Diagnosis not present

## 2023-09-21 DIAGNOSIS — C3431 Malignant neoplasm of lower lobe, right bronchus or lung: Secondary | ICD-10-CM | POA: Insufficient documentation

## 2023-09-21 DIAGNOSIS — I2510 Atherosclerotic heart disease of native coronary artery without angina pectoris: Secondary | ICD-10-CM | POA: Diagnosis not present

## 2023-09-21 DIAGNOSIS — Z87891 Personal history of nicotine dependence: Secondary | ICD-10-CM | POA: Diagnosis not present

## 2023-09-21 DIAGNOSIS — E782 Mixed hyperlipidemia: Secondary | ICD-10-CM | POA: Diagnosis not present

## 2023-09-21 DIAGNOSIS — C3490 Malignant neoplasm of unspecified part of unspecified bronchus or lung: Secondary | ICD-10-CM

## 2023-09-21 DIAGNOSIS — Z7902 Long term (current) use of antithrombotics/antiplatelets: Secondary | ICD-10-CM | POA: Diagnosis not present

## 2023-09-21 DIAGNOSIS — Z79899 Other long term (current) drug therapy: Secondary | ICD-10-CM | POA: Diagnosis not present

## 2023-09-21 DIAGNOSIS — Z880 Allergy status to penicillin: Secondary | ICD-10-CM | POA: Insufficient documentation

## 2023-09-21 NOTE — Progress Notes (Signed)
 [facility] [phone]   [phone]  OFFICE PROGRESS NOTE  Judithann Novas, MD [address] [place] [address]  DIAGNOSIS:  Stage IA (T1c, N0, M0) non-small cell lung cancer, moderately differentiated squamous cell carcinoma.  PRIOR THERAPY: status post right lower lobectomy with lymph node sampling on January 16, 2021 with a tumor size of 2.0 cm.  CURRENT THERAPY: Observation  INTERVAL HISTORY: Brandon [name] 68 y.o. male returns to the clinic today for annual follow-up visit.Discussed the use of AI scribe software for clinical note transcription with the patient, who gave verbal consent to proceed.  History of Present Illness   Brandon [name] "[place]" is a 68 year old male with stage IA squamous cell carcinoma of the lung who presents for evaluation and repeat CT scan for restaging.  He underwent a right lower lobectomy with lymph node sampling in September 2022 for stage IA squamous cell carcinoma of the lung. Since the surgery, he has been under observation with no evidence of recurrent or metastatic disease on recent imaging.  He has no new symptoms such as chest pain, breathing issues, nausea, or vomiting. His weight has remained stable with no significant weight loss or night sweats.  He mentions a decrease in physical activity, having stopped going to the gym a couple of years ago.        MEDICAL HISTORY: Past Medical History:  Diagnosis Date   Arthritis    Basal cell carcinoma    CAD (coronary artery disease) 06/14/2018   Cath on 01/23/2014 with LAD occlusion   Essential hypertension 06/08/2018   Gastroesophageal reflux disease 06/08/2018   GERD (gastroesophageal reflux disease)    Heart disease    Heart failure with reduced ejection fraction (HCC) 06/14/2018   ECHO 01/22/2014 with EF 40-45%   History of chicken pox    History of migraine    Hx of basal cell carcinoma 06/08/2018   04/2017. Left jaw   Hx of CABG     Hyperlipidemia    Inguinal hernia    Mixed hyperlipidemia 06/08/2018   Myocardial infarction [facility])    Tobacco use disorder 06/08/2018    ALLERGIES:  is allergic to antihistamines, loratadine-type; penicillins; shellfish allergy; and iodine.  MEDICATIONS:  Current Outpatient Medications  Medication Sig Dispense Refill   clopidogrel  (PLAVIX ) 75 MG tablet TAKE 1 TABLET BY MOUTH EVERY DAY 30 tablet 9   empagliflozin  (JARDIANCE ) 10 MG TABS tablet Take 1 tablet (10 mg total) by mouth daily before breakfast. 90 tablet 3   empagliflozin  (JARDIANCE ) 10 MG TABS tablet Take 1 tablet (10 mg total) by mouth daily before breakfast. 28 tablet    ezetimibe  (ZETIA ) 10 MG tablet *NEED OFFICE VISIT FOR REFFILL* TAKE 1 TABLET BY MOUTH EVERY DAY 90 tablet 2   fenofibrate  (TRICOR ) 145 MG tablet TAKE 1 TABLET BY MOUTH EVERY DAY 90 tablet 3   metoprolol  succinate (TOPROL  XL) 25 MG 24 hr tablet Take 1 tablet (25 mg total) by mouth daily. 90 tablet 3   Multiple Vitamin (MULTIVITAMIN) tablet Take 1 tablet by mouth daily.     nitroGLYCERIN  (NITROSTAT ) 0.4 MG SL tablet Place 1 tablet (0.4 mg total) under the tongue every 5 (five) minutes as needed for chest pain. 25 tablet 3   rosuvastatin  (CRESTOR ) 40 MG tablet Take 1 tablet (40 mg total) by mouth daily. TAKE 1 TABLET BY MOUTH EVERY DAY 90 tablet 2   sacubitril-valsartan (ENTRESTO ) 49-51 MG Take 1  tablet by mouth 2 (two) times daily. 180 tablet 2   No current facility-administered medications for this visit.    SURGICAL HISTORY:  Past Surgical History:  Procedure Laterality Date   APPENDECTOMY  1960   BRONCHIAL BIOPSY  01/16/2021   Procedure: BRONCHIAL BIOPSIES;  Surgeon: Prudy Brownie, DO;  Location: [facility];  Service: Pulmonary;;   BRONCHIAL BRUSHINGS  01/16/2021   Procedure: BRONCHIAL BRUSHINGS;  Surgeon: Prudy Brownie, DO;  Location: [facility];  Service: Pulmonary;;   CORONARY ARTERY BYPASS GRAFT     x 3   FIDUCIAL MARKER PLACEMENT   01/16/2021   Procedure: FIDUCIAL DYE MARKING;  Surgeon: Prudy Brownie, DO;  Location: [facility]OS[facility];  Service: Pulmonary;;   INGUINAL HERNIA REPAIR  2008   LOBECTOMY Right    2022   SKIN CANCER EXCISION     Stent placed     in 2005 and in 2014   VIDEO BRONCHOSCOPY WITH ENDOBRONCHIAL NAVIGATION N/A 01/16/2021   Procedure: VIDEO BRONCHOSCOPY WITH ENDOBRONCHIAL NAVIGATION;  Surgeon: Prudy Brownie, DO;  Location: [facility];  Service: Pulmonary;  Laterality: N/A;  ION, ICG+MB Dye Marking   VIDEO BRONCHOSCOPY WITH RADIAL ENDOBRONCHIAL ULTRASOUND  01/16/2021   Procedure: VIDEO BRONCHOSCOPY WITH RADIAL ENDOBRONCHIAL ULTRASOUND;  Surgeon: Prudy Brownie, DO;  Location: [facility];  Service: Pulmonary;;    REVIEW OF SYSTEMS:  A comprehensive review of systems was negative.   PHYSICAL EXAMINATION: General appearance: alert, cooperative, and no distress Head: Normocephalic, without obvious abnormality, atraumatic Neck: no adenopathy, no JVD, supple, symmetrical, trachea midline, and thyroid not enlarged, symmetric, no tenderness/mass/nodules Lymph nodes: Cervical, supraclavicular, and axillary nodes normal. Resp: clear to auscultation bilaterally Back: symmetric, no curvature. ROM normal. No CVA tenderness. Cardio: regular rate and rhythm, S1, S2 normal, no murmur, click, rub or gallop GI: soft, non-tender; bowel sounds normal; no masses,  no organomegaly Extremities: extremities normal, atraumatic, no cyanosis or edema  ECOG PERFORMANCE STATUS: 0 - Asymptomatic  Blood pressure (!) 102/58, pulse 64, temperature 97.6 F (36.4 C), temperature source Temporal, resp. rate 18, weight 211 lb 1.6 oz (95.8 kg), SpO2 95%.  LABORATORY DATA: Lab Results  Component Value Date   WBC 5.5 09/17/2023   HGB 14.8 09/17/2023   HCT 45.7 09/17/2023   MCV 91.6 09/17/2023   PLT 221 09/17/2023      Chemistry      Component Value Date/Time   NA 138 09/17/2023 1003   NA 136 02/11/2023 1030   K  5.2 (H) 09/17/2023 1003   CL 104 09/17/2023 1003   CO2 30 09/17/2023 1003   BUN 18 09/17/2023 1003   BUN 16 02/11/2023 1030   CREATININE 1.41 (H) 09/17/2023 1003      Component Value Date/Time   CALCIUM  9.2 09/17/2023 1003   ALKPHOS 33 (L) 09/17/2023 1003   AST 12 (L) 09/17/2023 1003   ALT 13 09/17/2023 1003   BILITOT 0.6 09/17/2023 1003       RADIOGRAPHIC STUDIES: CT Chest Wo Contrast Result Date: 09/20/2023 EXAM: CT CHEST WITHOUT CONTRAST 09/17/2023 10:51:49 AM TECHNIQUE: CT of the chest was performed without the administration of intravenous contrast. Multiplanar reformatted images are provided for review. Automated exposure control, iterative reconstruction, and/or weight based adjustment of the mA/kV was utilized to reduce the radiation dose to as low as reasonably achievable. COMPARISON: 09/22/2022 CLINICAL HISTORY: Non-small cell lung cancer (NSCLC), staging. Malignant neoplasm of lung; Non small cell lung cancer, staging; Lung cancer, right, dx'd 2022; Right lobectomy; Annual  scan; No chemo, no XRT; No complaints FINDINGS: MEDIASTINUM: Heart and pericardium are unremarkable. The central airways are clear. Severe 3-vessel coronary atherosclerosis. Status post CABG. LYMPH NODES: No mediastinal, hilar or axillary lymphadenopathy. LUNGS AND PLEURA: No focal consolidation or pulmonary edema. No pleural effusion or pneumothorax. Status post right lower lobectomy. SOFT TISSUES/BONES: No acute abnormality of the bones or soft tissues. UPPER ABDOMEN: Limited images of the upper abdomen demonstrates a 2.2 cm benign left hepatic cyst (image 133). Otherwise, no acute abnormality. IMPRESSION: 1. Status post right lower lobectomy. 2. No evidence of recurrent or metastatic disease. Electronically signed by: Zadie Herter MD 09/20/2023 11:47 PM EDT RP Workstation: [id number]    ASSESSMENT AND PLAN: This is a very pleasant 68 years old white male with stage IA (T1c, N0, M0) non-small cell lung cancer,  moderately differentiated squamous cell carcinoma, status post right lower lobectomy with lymph node sampling on January 16, 2021 with a tumor size of 2.0 cm. The patient is currently on observation. He had repeat CT scan of the chest performed recently.  I personally and independently reviewed the scan and discussed results with the patient today.  His scan showed no concerning findings for disease recurrence or metastasis. Assessment and Plan    Stage 1A squamous cell carcinoma of the lung Post right lower lobectomy with lymph node sampling in September 2022. Currently asymptomatic with no evidence of recurrent or metastatic disease on recent CT scan. Weight is stable with no new complaints. - Schedule follow-up in one year for repeat evaluation and CT scan.   The patient was advised to call immediately if he has any concerning symptoms in the interval. The patient voices understanding of current disease status and treatment options and is in agreement with the current care plan.  All questions were answered. The patient knows to call the clinic with any problems, questions or concerns. We can certainly see the patient much sooner if necessary.  The total time spent in the appointment was 20 minutes.  Disclaimer: This note was dictated with voice recognition software. Similar sounding words can inadvertently be transcribed and may not be corrected upon review.

## 2023-10-19 ENCOUNTER — Ambulatory Visit (INDEPENDENT_AMBULATORY_CARE_PROVIDER_SITE_OTHER)

## 2023-10-19 VITALS — BP 102/58 | Ht 68.0 in | Wt 210.0 lb

## 2023-10-19 DIAGNOSIS — Z0000 Encounter for general adult medical examination without abnormal findings: Secondary | ICD-10-CM | POA: Diagnosis not present

## 2023-10-19 NOTE — Patient Instructions (Signed)
 Mr. Brandon [name] , Thank you for taking time out of your busy schedule to complete your Annual Wellness Visit with me. I enjoyed our conversation and look forward to speaking with you again next year. I, as well as your care team,  appreciate your ongoing commitment to your health goals. Please review the following plan we discussed and let me know if I can assist you in the future. Your Game plan/ To Do List    Referrals: If you haven't heard from the office you've been referred to, please reach out to them at the phone provided.  none Follow up Visits: Next Medicare AWV with our clinical staff: patient declined    Have you seen your provider in the last 6 months (3 months if uncontrolled diabetes)? No Next Office Visit with your provider: 11/18/2023  Clinician Recommendations:  Aim for 30 minutes of exercise or brisk walking, 6-8 glasses of water, and 5 servings of fruits and vegetables each day.       This is a list of the screening recommended for you and due dates:  Health Maintenance  Topic Date Due   Hepatitis C Screening  Never done   COVID-19 Vaccine (7 - Pfizer risk 2024-25 season) 07/13/2023   Flu Shot  11/19/2023   Medicare Annual Wellness Visit  10/18/2024   Colon Cancer Screening  08/31/2025   DTaP/Tdap/Td vaccine (2 - Td or Tdap) 09/18/2028   Pneumococcal Vaccine for age over 34  Completed   Zoster (Shingles) Vaccine  Completed   Hepatitis B Vaccine  Aged Out   HPV Vaccine  Aged Out   Meningitis B Vaccine  Aged Out    Advanced directives: (In Chart) A copy of your advanced directives are scanned into your chart should your provider ever need it. Advance Care Planning is important because it:  [x]  Makes sure you receive the medical care that is consistent with your values, goals, and preferences  [x]  It provides guidance to your family and loved ones and reduces their decisional burden about whether or not they are making the right decisions based on your wishes.  Follow  the link provided in your after visit summary or read over the paperwork we have mailed to you to help you started getting your Advance Directives in place. If you need assistance in completing these, please reach out to us  so that we can help you!  See attachments for Preventive Care and Fall Prevention Tips.

## 2023-10-19 NOTE — Progress Notes (Signed)
 Because this visit was a virtual/telehealth visit,  certain criteria was not obtained, such a blood pressure, CBG if applicable, and timed get up and go. Any medications not marked as taking were not mentioned during the medication reconciliation part of the visit. Any vitals not documented were not able to be obtained due to this being a telehealth visit or patient was unable to self-report a recent blood pressure reading due to a lack of equipment at home via telehealth. Vitals that have been documented are verbally provided by the patient.   This visit was performed by a medical professional under my direct supervision. I was immediately available for consultation/collaboration. I have reviewed and agree with the Annual Wellness Visit documentation.  Subjective:   Brandon [name] is a 68 y.o. who presents for a Medicare Wellness preventive visit.  As a reminder, Annual Wellness Visits don't include a physical exam, and some assessments may be limited, especially if this visit is performed virtually. We may recommend an in-person follow-up visit with your provider if needed.  Visit Complete: Virtual I connected with  Raford Pina on 10/19/23 by a audio enabled telemedicine application and verified that I am speaking with the correct person using two identifiers.  Patient Location: Home  Provider Location: Home Office  I discussed the limitations of evaluation and management by telemedicine. The patient expressed understanding and agreed to proceed.  Vital Signs: Because this visit was a virtual/telehealth visit, some criteria may be missing or patient reported. Any vitals not documented were not able to be obtained and vitals that have been documented are patient reported.  VideoDeclined- This patient declined Librarian, academic. Therefore the visit was completed with audio only.  Persons Participating in Visit: Patient.  AWV Questionnaire: No: Patient  Medicare AWV questionnaire was not completed prior to this visit.  Cardiac Risk Factors include: advanced age (>75men, >18 women);male gender;obesity (BMI >30kg/m2);smoking/ tobacco exposure;dyslipidemia;hypertension     Objective:    Today's Vitals   10/19/23 0819  BP: (!) 102/58  Weight: 210 lb (95.3 kg)  Height: 5' 8 (1.727 m)   Body mass index is 31.93 kg/m.     10/19/2023    8:23 AM 09/23/2021    8:55 AM 03/25/2021    2:15 PM 01/16/2021   10:24 AM 01/14/2021    8:58 AM  Advanced Directives  Does Patient Have a Medical Advance Directive? Yes No No No No  Type of Estate agent of [place];Living will      Does patient want to make changes to medical advance directive? No - Patient declined      Copy of Healthcare Power of Attorney in Chart? Yes - validated most recent copy scanned in chart (See row information)      Would patient like information on creating a medical advance directive?    No - Patient declined     Current Medications (verified) Outpatient Encounter Medications as of 10/19/2023  Medication Sig   clopidogrel  (PLAVIX ) 75 MG tablet TAKE 1 TABLET BY MOUTH EVERY DAY   empagliflozin  (JARDIANCE ) 10 MG TABS tablet Take 1 tablet (10 mg total) by mouth daily before breakfast.   empagliflozin  (JARDIANCE ) 10 MG TABS tablet Take 1 tablet (10 mg total) by mouth daily before breakfast.   ezetimibe  (ZETIA ) 10 MG tablet *NEED OFFICE VISIT FOR REFFILL* TAKE 1 TABLET BY MOUTH EVERY DAY   fenofibrate  (TRICOR ) 145 MG tablet TAKE 1 TABLET BY MOUTH EVERY DAY   metoprolol  succinate (TOPROL  XL) 25  MG 24 hr tablet Take 1 tablet (25 mg total) by mouth daily.   Multiple Vitamin (MULTIVITAMIN) tablet Take 1 tablet by mouth daily.   nitroGLYCERIN  (NITROSTAT ) 0.4 MG SL tablet Place 1 tablet (0.4 mg total) under the tongue every 5 (five) minutes as needed for chest pain.   rosuvastatin  (CRESTOR ) 40 MG tablet Take 1 tablet (40 mg total) by mouth daily. TAKE 1 TABLET BY MOUTH  EVERY DAY   sacubitril-valsartan (ENTRESTO ) 49-51 MG Take 1 tablet by mouth 2 (two) times daily.   No facility-administered encounter medications on file as of 10/19/2023.    Allergies (verified) Antihistamines, loratadine-type; Penicillins; Shellfish allergy; and Iodine   History: Past Medical History:  Diagnosis Date   Arthritis    Basal cell carcinoma    CAD (coronary artery disease) 06/14/2018   Cath on 01/23/2014 with LAD occlusion   Essential hypertension 06/08/2018   Gastroesophageal reflux disease 06/08/2018   GERD (gastroesophageal reflux disease)    Heart disease    Heart failure with reduced ejection fraction (HCC) 06/14/2018   ECHO 01/22/2014 with EF 40-45%   History of chicken pox    History of migraine    Hx of basal cell carcinoma 06/08/2018   04/2017. Left jaw   Hx of CABG    Hyperlipidemia    Inguinal hernia    Mixed hyperlipidemia 06/08/2018   Myocardial infarction [facility])    Tobacco use disorder 06/08/2018   Past Surgical History:  Procedure Laterality Date   APPENDECTOMY  1960   BRONCHIAL BIOPSY  01/16/2021   Procedure: BRONCHIAL BIOPSIES;  Surgeon: Brenna Adine CROME, DO;  Location: [facility];  Service: Pulmonary;;   BRONCHIAL BRUSHINGS  01/16/2021   Procedure: BRONCHIAL BRUSHINGS;  Surgeon: Brenna Adine CROME, DO;  Location: [facility];  Service: Pulmonary;;   CORONARY ARTERY BYPASS GRAFT     x 3   FIDUCIAL MARKER PLACEMENT  01/16/2021   Procedure: FIDUCIAL DYE MARKING;  Surgeon: Brenna Adine CROME, DO;  Location: [facility];  Service: Pulmonary;;   INGUINAL HERNIA REPAIR  2008   LOBECTOMY Right    2022   SKIN CANCER EXCISION     Stent placed     in 2005 and in 2014   VIDEO BRONCHOSCOPY WITH ENDOBRONCHIAL NAVIGATION N/A 01/16/2021   Procedure: VIDEO BRONCHOSCOPY WITH ENDOBRONCHIAL NAVIGATION;  Surgeon: Brenna Adine CROME, DO;  Location: [facility];  Service: Pulmonary;  Laterality: N/A;  ION, ICG+MB Dye Marking   VIDEO BRONCHOSCOPY WITH RADIAL  ENDOBRONCHIAL ULTRASOUND  01/16/2021   Procedure: VIDEO BRONCHOSCOPY WITH RADIAL ENDOBRONCHIAL ULTRASOUND;  Surgeon: Brenna Adine CROME, DO;  Location: [facility];  Service: Pulmonary;;   Family History  Problem Relation Age of Onset   Dementia Mother    Heart attack Mother 5   Heart disease Mother    Colon cancer Father 48   Diabetes Father    Colon polyps Sister    Heart attack Brother 69   Heart attack Maternal Grandmother    Heart attack Maternal Grandfather    Stroke Paternal Grandmother 88   Prostate cancer Paternal Grandfather    Social History   Socioeconomic History   Marital status: Divorced    Spouse name: Not on file   Number of children: 0   Years of education: Not on file   Highest education level: Not on file  Occupational History   Not on file  Tobacco Use   Smoking status: Every Day    Current packs/day: 0.00    Average packs/day: 1.5  packs/day for 46.0 years (69.0 ttl pk-yrs)    Types: Cigarettes, E-cigarettes    Start date: 1975    Last attempt to quit: 04/21/2019    Years since quitting: 4.4   Smokeless tobacco: Former    Types: Chew    Quit date: 04/21/2003  Vaping Use   Vaping status: Every Day   Substances: Nicotine  Substance and Sexual Activity   Alcohol use: Yes    Comment: 2 to 3 beers daily   Drug use: Yes    Types: Marijuana    Comment: once a week   Sexual activity: Yes  Other Topics Concern   Not on file  Social History Narrative   06/15/19   [place]   Living: alone, moved to help care for his mother   Work: retired Financial trader      Family: Mother still living, sister is nearby - [place]      Enjoys: fishing      Exercise: not currently   Diet: eating more than he should, trying to reduce sweets      Safety   Seat belts: Yes    Guns: Yes    Safe in relationships: Yes    Social Drivers of Corporate investment banker Strain: Low Risk  (10/19/2023)   Overall Financial Resource Strain (CARDIA)     Difficulty of Paying Living Expenses: Not hard at all  Food Insecurity: No Food Insecurity (10/19/2023)   Hunger Vital Sign    Worried About Running Out of Food in the Last Year: Never true    Ran Out of Food in the Last Year: Never true  Transportation Needs: No Transportation Needs (10/19/2023)   PRAPARE - Administrator, Civil Service (Medical): No    Lack of Transportation (Non-Medical): No  Physical Activity: Insufficiently Active (10/19/2023)   Exercise Vital Sign    Days of Exercise per Week: 4 days    Minutes of Exercise per Session: 20 min  Stress: No Stress Concern Present (10/19/2023)   [employer] of Occupational Health - Occupational Stress Questionnaire    Feeling of Stress: Not at all  Social Connections: Unknown (10/19/2023)   Social Connection and Isolation Panel    Frequency of Communication with Friends and Family: More than three times a week    Frequency of Social Gatherings with Friends and Family: Patient declined    Attends Religious Services: Patient declined    Database administrator or Organizations: Patient declined    Attends Engineer, structural: Patient declined    Marital Status: Patient declined    Tobacco Counseling Ready to quit: Not Answered Counseling given: Not Answered    Clinical Intake:  Pre-visit preparation completed: Yes  Pain : No/denies pain     BMI - recorded: 31.93 Nutritional Status: BMI > 30  Obese Nutritional Risks: None Diabetes: No  Lab Results  Component Value Date   HGBA1C 6.3 (H) 07/15/2023     How often do you need to have someone help you when you read instructions, pamphlets, or other written materials from your doctor or pharmacy?: 1 - Never What is the last grade level you completed in school?: college degree  Interpreter Needed?: No  Information entered by :: Mckinley Adelstein,cma   Activities of Daily Living     10/19/2023    8:22 AM  In your present state of health, do you have  any difficulty performing the following activities:  Hearing? 0  Vision?  0  Difficulty concentrating or making decisions? 0  Walking or climbing stairs? 0  Dressing or bathing? 0  Doing errands, shopping? 0  Preparing Food and eating ? N  Using the Toilet? N  In the past six months, have you accidently leaked urine? N  Do you have problems with loss of bowel control? N  Managing your Medications? N  Managing your Finances? N  Housekeeping or managing your Housekeeping? N    Patient Care Team: Avelina Greig BRAVO, MD as PCP - General (Family Medicine) Thukkani, Arun K, MD as PCP - Cardiology (Cardiology) Fate Morna SAILOR, [facility] (Inactive) as Pharmacist (Pharmacist)  I have updated your Care Teams any recent Medical Services you may have received from other providers in the past year.     Assessment:   This is a routine wellness examination for Derma.  Hearing/Vision screen Hearing Screening - Comments:: No difficulties  Vision Screening - Comments:: Patient wears glasses    Goals Addressed             This Visit's Progress    Patient Stated       Patient declined        Depression Screen     10/19/2023    8:24 AM 07/31/2022    2:02 PM 05/12/2022    9:15 AM 06/08/2018   11:48 AM  PHQ 2/9 Scores  PHQ - 2 Score 0 0 0 0  PHQ- 9 Score 0  1 3    Fall Risk     10/19/2023    8:22 AM 07/31/2022    2:02 PM 05/12/2022    9:15 AM  Fall Risk   Falls in the past year? 0 0 0  Number falls in past yr: 0 0 0  Injury with Fall? 0 0 0  Risk for fall due to : No Fall Risks  No Fall Risks  Follow up Falls evaluation completed Falls evaluation completed;Education provided;Falls prevention discussed Falls evaluation completed      Data saved with a previous flowsheet row definition    MEDICARE RISK AT HOME:  Medicare Risk at Home Any stairs in or around the home?: Yes If so, are there any without handrails?: No Home free of loose throw rugs in walkways, pet beds, electrical  cords, etc?: Yes Adequate lighting in your home to reduce risk of falls?: Yes Life alert?: No Use of a cane, walker or w/c?: No Grab bars in the bathroom?: No Shower chair or bench in shower?: No Elevated toilet seat or a handicapped toilet?: No  TIMED UP AND GO:  Was the test performed?  No  Cognitive Function: 6CIT completed        10/19/2023    8:21 AM  6CIT Screen  What Year? 0 points  What month? 0 points  What time? 0 points  Count back from 20 0 points  Months in reverse 0 points  Repeat phrase 0 points  Total Score 0 points    Immunizations Immunization History  Administered Date(s) Administered   Fluad Quad(high Dose 65+) 02/27/2020   Influenza Inj Mdck Quad Pf 02/09/2021   Influenza, High Dose Seasonal PF 02/17/2019   Influenza,inj,Quad PF,6+ Mos 06/08/2018   Influenza-Unspecified 02/04/2022   Moderna Covid-19 Fall Seasonal Vaccine 44yrs & older 01/13/2023   PFIZER(Purple Top)SARS-COV-2 Vaccination 06/16/2019, 07/12/2019, 04/01/2020   PNEUMOCOCCAL CONJUGATE-20 11/10/2022   Pfizer Covid-19 Vaccine Bivalent Booster 58yrs & up 06/21/2021, 02/04/2022   RSV,unspecified 04/15/2022   Tdap 09/19/2018   Zoster Recombinant(Shingrix) 02/04/2022,  04/15/2022    Screening Tests Health Maintenance  Topic Date Due   Hepatitis C Screening  Never done   COVID-19 Vaccine (7 - Pfizer risk 2024-25 season) 07/13/2023   INFLUENZA VACCINE  11/19/2023   Medicare Annual Wellness (AWV)  10/18/2024   Colonoscopy  08/31/2025   DTaP/Tdap/Td (2 - Td or Tdap) 09/18/2028   Pneumococcal Vaccine: 50+ Years  Completed   Zoster Vaccines- Shingrix  Completed   Hepatitis B Vaccines  Aged Out   HPV VACCINES  Aged Out   Meningococcal B Vaccine  Aged Out    Health Maintenance  Health Maintenance Due  Topic Date Due   Hepatitis C Screening  Never done   COVID-19 Vaccine (7 - Pfizer risk 2024-25 season) 07/13/2023   Health Maintenance Items Addressed:   Additional  Screening:  Vision Screening: Recommended annual ophthalmology exams for early detection of glaucoma and other disorders of the eye. Would you like a referral to an eye doctor? No    Dental Screening: Recommended annual dental exams for proper oral hygiene  Community Resource Referral / Chronic Care Management: CRR required this visit?  No   CCM required this visit?  No   Plan:    I have personally reviewed and noted the following in the patient's chart:   Medical and social history Use of alcohol, tobacco or illicit drugs  Current medications and supplements including opioid prescriptions. Patient is not currently taking opioid prescriptions. Functional ability and status Nutritional status Physical activity Advanced directives List of other physicians Hospitalizations, surgeries, and ER visits in previous 12 months Vitals Screenings to include cognitive, depression, and falls Referrals and appointments  In addition, I have reviewed and discussed with patient certain preventive protocols, quality metrics, and best practice recommendations. A written personalized care plan for preventive services as well as general preventive health recommendations were provided to patient.   Lyle MARLA Right, [place]   10/19/2023   After Visit Summary: (MyChart) Due to this being a telephonic visit, the after visit summary with patients personalized plan was offered to patient via MyChart   Notes: Nothing significant to report at this time.

## 2023-10-20 ENCOUNTER — Ambulatory Visit (HOSPITAL_COMMUNITY)
Admission: RE | Admit: 2023-10-20 | Discharge: 2023-10-20 | Disposition: A | Discharge status: Home / Self Care | Source: Ambulatory Visit | Attending: Cardiology | Admitting: Cardiology

## 2023-10-20 DIAGNOSIS — I5189 Other ill-defined heart diseases: Secondary | ICD-10-CM | POA: Diagnosis not present

## 2023-10-20 LAB — ECHOCARDIOGRAM COMPLETE
Area-P 1/2: 3.42 cm2
S' Lateral: 3.9 cm

## 2023-10-23 ENCOUNTER — Ambulatory Visit: Payer: Self-pay | Admitting: Internal Medicine

## 2023-10-27 ENCOUNTER — Other Ambulatory Visit: Payer: Self-pay | Admitting: Interventional Cardiology

## 2023-11-11 ENCOUNTER — Encounter: Payer: Medicare HMO | Admitting: Family Medicine

## 2023-11-18 ENCOUNTER — Encounter: Payer: Self-pay | Admitting: Family Medicine

## 2023-11-18 ENCOUNTER — Ambulatory Visit (INDEPENDENT_AMBULATORY_CARE_PROVIDER_SITE_OTHER): Admitting: Family Medicine

## 2023-11-18 VITALS — BP 91/43 | HR 64 | Temp 97.7°F | Ht 67.0 in | Wt 212.4 lb

## 2023-11-18 DIAGNOSIS — I10 Essential (primary) hypertension: Secondary | ICD-10-CM

## 2023-11-18 DIAGNOSIS — Z0000 Encounter for general adult medical examination without abnormal findings: Secondary | ICD-10-CM

## 2023-11-18 DIAGNOSIS — R7303 Prediabetes: Secondary | ICD-10-CM

## 2023-11-18 DIAGNOSIS — Z125 Encounter for screening for malignant neoplasm of prostate: Secondary | ICD-10-CM | POA: Diagnosis not present

## 2023-11-18 DIAGNOSIS — E782 Mixed hyperlipidemia: Secondary | ICD-10-CM | POA: Diagnosis not present

## 2023-11-18 DIAGNOSIS — I5020 Unspecified systolic (congestive) heart failure: Secondary | ICD-10-CM | POA: Diagnosis not present

## 2023-11-18 DIAGNOSIS — C3431 Malignant neoplasm of lower lobe, right bronchus or lung: Secondary | ICD-10-CM | POA: Diagnosis not present

## 2023-11-18 NOTE — Assessment & Plan Note (Signed)
Followed by Onc , Dr Arbutus Ped status post right lower lobectomy with lymph node sampling on January 16, 2021 with a tumor size of 2.0 cm.

## 2023-11-18 NOTE — Assessment & Plan Note (Signed)
 Chronic, now on jardiance  for CHF.

## 2023-11-18 NOTE — Assessment & Plan Note (Signed)
Well-controlled at last check on Zetia 10 mg daily and Crestor 40 mg daily,  Tricor 45 mg p.o. daily  LDL goal < 70 give CAD

## 2023-11-18 NOTE — Assessment & Plan Note (Signed)
 Now on jardiance  10 mg daily

## 2023-11-18 NOTE — Assessment & Plan Note (Signed)
 Low blood pressure.. mildly symptomatic   Metoprolol  XL 25 mg daily  Entresto  daily

## 2023-11-18 NOTE — Progress Notes (Unsigned)
 Patient ID: Brandon [name], male    DOB: [birth year], 68 y.o.   MRN: [id number]  This visit was conducted in person.  BP (!) 91/43   Pulse 64   Temp 97.7 F (36.5 C) (Temporal)   Ht 5' 7 (1.702 m)   Wt 212 lb 6 oz (96.3 kg)   SpO2 94%   BMI 33.26 kg/m    CC:  Chief Complaint  Patient presents with  . Annual Exam    Part 2 ([facility] 10/19/2023)    Subjective:   HPI: Brandon [name] is a 68 y.o. male presenting on 11/18/2023 for Annual Exam (Part 2 ([facility] 10/19/2023))  The patient presents for  complete physical and review of chronic health problems. He/She also has the following acute concerns today: None  The patient saw a LPN or RN for medicare wellness visit. 10/19/2023  Prevention and wellness was reviewed in detail. Note reviewed and important notes copied below.  Wt Readings from Last 3 Encounters:  11/18/23 212 lb 6 oz (96.3 kg)  10/19/23 210 lb (95.3 kg)  09/21/23 211 lb 1.6 oz (95.8 kg)      Reviewed last OV with Dr. Sherrod for 6 month follow up lung cancer status post right lower lobectomy with lymph node sampling on January 16, 2021 with a tumor size of 2.0 cm.   No chemo or radiation.   Hypertension:  Low  on  lower dose 25 mg metoprolol  XL and Entresto  Drinking water 2-4 16 oz a day BP Readings from Last 3 Encounters:  11/18/23 (!) 91/43  10/19/23 (!) 102/58  09/21/23 (!) 102/58  Using medication without problems or lightheadedness:  occ with standing  Chest pain with exertion: none Edema:none Short of breath:none Average home BPs: Other issues:  Elevated Cholesterol: Well-controlled at last check on Zetia  10 mg daily and Crestor  40 mg daily Lab Results  Component Value Date   CHOL 139 01/20/2023   HDL 43 01/20/2023   LDLCALC 68 01/20/2023   TRIG 167 (H) 01/20/2023   CHOLHDL 3.2 01/20/2023  Using medications without problems: Muscle aches:  Diet compliance: moderate Exercise: walking 3-4 days a week  Other complaints:  CAD, s/P CABG and  additional stent Congestive heart failure with reduced ejection fraction.  Echo October 2015 EF 40 to 45%, repeat echo is similar in December 2020. n 2000, angina was back pain but then went to this left arm.  He had an MI at that time and later had CABG, LIMA and 2 radial arteries.   CAD history: CABG x 3 in 2000, [place] , [place]. [facility] 2005: Stent at [facility].  Angina felt like intermittent back pain.  2015: Stent at [facility], 2.25 x 16 Promus in Radial to RCA.  He has been on Plavix  in the past. His PMD tried to stop his Plavix , but he preferred to stay on it. Followed by Dr.  Wendel cardiology..  Reviewed last office visit from  06/2023    Prediabetes..  On jardiance  Lab Results  Component Value Date   HGBA1C 6.3 (H) 07/15/2023     Relevant past medical, surgical, family and social history reviewed and updated as indicated. Interim medical history since our last visit reviewed. Allergies and medications reviewed and updated. Outpatient Medications Prior to Visit  Medication Sig Dispense Refill  . clopidogrel  (PLAVIX ) 75 MG tablet TAKE 1 TABLET BY MOUTH EVERY DAY 30 tablet 9  . empagliflozin  (JARDIANCE ) 10 MG TABS tablet Take 1 tablet (10 mg total) by  mouth daily before breakfast. 90 tablet 3  . ezetimibe  (ZETIA ) 10 MG tablet *NEED OFFICE VISIT FOR REFFILL* TAKE 1 TABLET BY MOUTH EVERY DAY 90 tablet 2  . fenofibrate  (TRICOR ) 145 MG tablet TAKE 1 TABLET BY MOUTH EVERY DAY 90 tablet 3  . metoprolol  succinate (TOPROL  XL) 25 MG 24 hr tablet Take 1 tablet (25 mg total) by mouth daily. 90 tablet 3  . nitroGLYCERIN  (NITROSTAT ) 0.4 MG SL tablet Place 1 tablet (0.4 mg total) under the tongue every 5 (five) minutes as needed for chest pain. 25 tablet 3  . rosuvastatin  (CRESTOR ) 40 MG tablet Take 1 tablet (40 mg total) by mouth daily. TAKE 1 TABLET BY MOUTH EVERY DAY 90 tablet 2  . sacubitril-valsartan (ENTRESTO ) 49-51 MG Take 1 tablet by mouth 2 (two) times daily. 180 tablet 2  .  Multiple Vitamin (MULTIVITAMIN) tablet Take 1 tablet by mouth daily.    . empagliflozin  (JARDIANCE ) 10 MG TABS tablet Take 1 tablet (10 mg total) by mouth daily before breakfast. 28 tablet    No facility-administered medications prior to visit.     Per HPI unless specifically indicated in ROS section below Review of Systems  Constitutional:  Negative for fatigue and fever.  HENT:  Negative for ear pain.   Eyes:  Negative for pain.  Respiratory:  Negative for cough and shortness of breath.   Cardiovascular:  Negative for chest pain, palpitations and leg swelling.  Gastrointestinal:  Negative for abdominal pain.  Genitourinary:  Negative for dysuria.  Musculoskeletal:  Negative for arthralgias.  Neurological:  Negative for syncope, light-headedness and headaches.  Psychiatric/Behavioral:  Negative for dysphoric mood.    Objective:  BP (!) 91/43   Pulse 64   Temp 97.7 F (36.5 C) (Temporal)   Ht 5' 7 (1.702 m)   Wt 212 lb 6 oz (96.3 kg)   SpO2 94%   BMI 33.26 kg/m   Wt Readings from Last 3 Encounters:  11/18/23 212 lb 6 oz (96.3 kg)  10/19/23 210 lb (95.3 kg)  09/21/23 211 lb 1.6 oz (95.8 kg)      Physical Exam Constitutional:      Appearance: He is well-developed.  HENT:     Head: Normocephalic.     Right Ear: Hearing normal.     Left Ear: Hearing normal.     Nose: Nose normal.  Neck:     Thyroid: No thyroid mass or thyromegaly.     Vascular: No carotid bruit.     Trachea: Trachea normal.  Cardiovascular:     Rate and Rhythm: Normal rate and regular rhythm.     Pulses: Normal pulses.     Heart sounds: Heart sounds not distant. No murmur heard.    No friction rub. No gallop.     Comments: No peripheral edema Pulmonary:     Effort: Pulmonary effort is normal. No respiratory distress.     Breath sounds: Normal breath sounds.  Skin:    General: Skin is warm and dry.     Findings: No rash.  Psychiatric:        Speech: Speech normal.        Behavior: Behavior  normal.        Thought Content: Thought content normal.       Results for orders placed or performed during the hospital encounter of 10/20/23  ECHOCARDIOGRAM COMPLETE   Collection Time: 10/20/23 10:50 AM  Result Value Ref Range   Area-P 1/2 3.42 cm2   S' Lateral  3.90 cm   Est EF 50 - 55%     Assessment and Plan The patient's preventative maintenance and recommended screening tests for an annual wellness exam were reviewed in full today. Brought up to date unless services declined.  Counselled on the importance of diet, exercise, and its role in overall health and mortality. The patient's FH and SH was reviewed, including their home life, tobacco status, and drug and alcohol status.    Nml Abdominal US , no AA in 2023.  Vaccines:uptodate flu and  COVID, consider PNA (given)and TDap ( per pt 2020 in [place], [place] when hook removed Prostate Cancer Screen: Lab Results  Component Value Date   PSA 2.95 05/12/2022  Colon Cancer Screen: 09/01/2022 repeat in 3 years, Cirigliano,  father with colon cancer      Smoking Status: Quit in last 2 years, now vaping ETOH/ drug use: none/none  Hep C: refused HAs had AAA US  eval 10/2021  Routine general medical examination at a health care facility  Mixed hyperlipidemia Assessment & Plan: Well-controlled at last check on Zetia  10 mg daily and Crestor  40 mg daily,  Tricor  45 mg p.o. daily  LDL goal < 70 give CAD  Orders: -     Comprehensive metabolic panel with GFR; Future -     Lipid panel; Future  Essential hypertension Assessment & Plan:  Low blood pressure.. mildly symptomatic   Metoprolol  XL 25 mg daily  Entresto  daily   Heart failure with reduced ejection fraction (HCC) Assessment & Plan:  Now on jardiance  10 mg daily   Prediabetes Assessment & Plan:  Chronic, now on jardiance  for CHF.  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    Prostate cancer screening -     PSA, Medicare; Future      Return for lab only, fasting labs.   Greig Ring, MD

## 2023-11-25 ENCOUNTER — Ambulatory Visit: Payer: Self-pay | Admitting: Family Medicine

## 2023-11-25 ENCOUNTER — Other Ambulatory Visit (INDEPENDENT_AMBULATORY_CARE_PROVIDER_SITE_OTHER)

## 2023-11-25 DIAGNOSIS — Z125 Encounter for screening for malignant neoplasm of prostate: Secondary | ICD-10-CM

## 2023-11-25 DIAGNOSIS — R7303 Prediabetes: Secondary | ICD-10-CM

## 2023-11-25 DIAGNOSIS — E782 Mixed hyperlipidemia: Secondary | ICD-10-CM

## 2023-11-25 LAB — COMPREHENSIVE METABOLIC PANEL WITH GFR
ALT: 17 U/L (ref 0–53)
AST: 16 U/L (ref 0–37)
Albumin: 4.6 g/dL (ref 3.5–5.2)
Alkaline Phosphatase: 35 U/L — ABNORMAL LOW (ref 39–117)
BUN: 22 mg/dL (ref 6–23)
CO2: 27 meq/L (ref 19–32)
Calcium: 9.7 mg/dL (ref 8.4–10.5)
Chloride: 100 meq/L (ref 96–112)
Creatinine, Ser: 1.53 mg/dL — ABNORMAL HIGH (ref 0.40–1.50)
GFR: 45.79 mL/min — ABNORMAL LOW (ref >60.00)
Glucose, Bld: 123 mg/dL — ABNORMAL HIGH (ref 70–99)
Potassium: 5.2 meq/L — ABNORMAL HIGH (ref 3.5–5.1)
Sodium: 138 meq/L (ref 135–145)
Total Bilirubin: 0.5 mg/dL (ref 0.2–1.2)
Total Protein: 6.6 g/dL (ref 6.0–8.3)

## 2023-11-25 LAB — LIPID PANEL
Cholesterol: 138 mg/dL (ref 0–200)
HDL: 44.4 mg/dL (ref >39.00)
LDL Cholesterol: 65 mg/dL (ref 0–99)
NonHDL: 93.85
Total CHOL/HDL Ratio: 3
Triglycerides: 143 mg/dL (ref 0.0–149.0)
VLDL: 28.6 mg/dL (ref 0.0–40.0)

## 2023-11-25 LAB — PSA, MEDICARE: PSA: 2.95 ng/mL (ref 0.10–4.00)

## 2023-11-25 LAB — HEMOGLOBIN A1C: Hgb A1c MFr Bld: 6.4 % (ref 4.6–6.5)

## 2023-12-01 DIAGNOSIS — B078 Other viral warts: Secondary | ICD-10-CM | POA: Diagnosis not present

## 2023-12-01 DIAGNOSIS — Z85820 Personal history of malignant melanoma of skin: Secondary | ICD-10-CM | POA: Diagnosis not present

## 2023-12-01 DIAGNOSIS — L905 Scar conditions and fibrosis of skin: Secondary | ICD-10-CM | POA: Diagnosis not present

## 2023-12-01 DIAGNOSIS — Z85828 Personal history of other malignant neoplasm of skin: Secondary | ICD-10-CM | POA: Diagnosis not present

## 2023-12-01 DIAGNOSIS — L570 Actinic keratosis: Secondary | ICD-10-CM | POA: Diagnosis not present

## 2023-12-01 DIAGNOSIS — D171 Benign lipomatous neoplasm of skin and subcutaneous tissue of trunk: Secondary | ICD-10-CM | POA: Diagnosis not present

## 2023-12-01 DIAGNOSIS — D485 Neoplasm of uncertain behavior of skin: Secondary | ICD-10-CM | POA: Diagnosis not present

## 2023-12-01 DIAGNOSIS — D225 Melanocytic nevi of trunk: Secondary | ICD-10-CM | POA: Diagnosis not present

## 2023-12-01 DIAGNOSIS — C44319 Basal cell carcinoma of skin of other parts of face: Secondary | ICD-10-CM | POA: Diagnosis not present

## 2023-12-01 DIAGNOSIS — L821 Other seborrheic keratosis: Secondary | ICD-10-CM | POA: Diagnosis not present

## 2024-01-18 DIAGNOSIS — C44319 Basal cell carcinoma of skin of other parts of face: Secondary | ICD-10-CM | POA: Diagnosis not present

## 2024-01-19 ENCOUNTER — Other Ambulatory Visit: Payer: Self-pay | Admitting: Physician Assistant

## 2024-01-24 ENCOUNTER — Other Ambulatory Visit: Payer: Self-pay | Admitting: Internal Medicine

## 2024-01-25 MED ORDER — FENOFIBRATE 145 MG PO TABS: 145.0000 mg | ORAL_TABLET | Freq: Every day | ORAL | 1 refills | Status: AC

## 2024-02-10 ENCOUNTER — Other Ambulatory Visit: Payer: Self-pay

## 2024-02-10 DIAGNOSIS — Z951 Presence of aortocoronary bypass graft: Secondary | ICD-10-CM

## 2024-02-11 MED ORDER — CLOPIDOGREL BISULFATE 75 MG PO TABS: 75.0000 mg | ORAL_TABLET | Freq: Every day | ORAL | 1 refills | Status: AC

## 2024-04-04 NOTE — Progress Notes (Unsigned)
 Cardiology Office Note:   Date:  04/06/2024  ID:  Brandon [name], DOB [birth year], MRN [id number] PCP:  Avelina Greig BRAVO, MD  [facility] Providers Cardiologist:  Wendel Haws, MD Referring MD: Avelina Greig BRAVO, MD  Chief Complaint/Reason for Referral: Follow-up for coronary artery disease ASSESSMENT:    1. Hx of CABG   2. Essential hypertension   3. Hyperlipidemia LDL goal <70   4. Aortic atherosclerosis   5. CKD (chronic kidney disease) stage 2, GFR 60-89 ml/min   6. Diastolic dysfunction   7. Ischemic cardiomyopathy   8. Tobacco abuse   9. Prediabetes   10. BMI 32.0-32.9,adult      PLAN:   In order of problems listed above: History of CABG: Given history of PCI, continue Plavix  75 mg indefinitely.  Continue Zetia  10 mg, Tricor  145 mg, Crestor  40 mg.  Continue as needed nitroglycerin .  Continue Toprol  25 mg given prior LV dysfunction.   Hypertension: Continue Entresto  49 x 51 mg and Toprol  25 mg Hyperlipidemia: LDL was at goal in October.  Will check hs-CRP and if elevated consider low-dose colchicine.  Patient does not have CKD stage III.  Aortic atherosclerosis: Continue Plavix  75 mg daily and rosuvastatin  40 mg daily. CKD stage II: Continue Entresto  49 x 51 mg twice daily; Jardiance  10 mg daily Diastolic dysfunction: Continue Entresto  49 x 51 mg daily and Jardiance  10 mg daily. Ischemic cardiomyopathy: Continue Entresto  49 x 51 mg daily, Toprol  25 mg, Jardiance  10 mg daily.  EF improved to 50 to 55%.  Patient's blood pressures are between 80s to 120s at home.  Will continue current doses.  Patient feels well is without dyspnea. Tobacco abuse: Had reassuring abdominal ultrasound in 2023.  No symptoms of claudication.  Will obtain screening carotid ultrasound Prediabetes: Continue to control cardiovascular risk factors.  Continue Jardiance  10 mg daily Elevated BMI: Continue diet and exercise modification.  If develops diabetes would consider GLP-1 receptor agonist therapy.             Dispo:  Return in about 6 months (around 10/05/2024).      Medication Adjustments/Labs and Tests Ordered: Current medicines are reviewed at length with the patient today.  Concerns regarding medicines are outlined above.  The following changes have been made:     Labs/tests ordered: Orders Placed This Encounter  Procedures   High sensitivity CRP   EKG 12-Lead   VAS US  VASCUSCREEN    Medication Changes: No orders of the defined types were placed in this encounter.   Current medicines are reviewed at length with the patient today.  The patient does not have concerns regarding medicines.  I spent 38 minutes reviewing all clinical data during and prior to this visit including all relevant imaging studies, laboratories, clinical information from other health systems and prior notes from both Cardiology and other specialties, interviewing the patient, conducting a complete physical examination, and coordinating care in order to formulate a comprehensive and personalized evaluation and treatment plan.   History of Present Illness:      FOCUSED PROBLEM LIST:   CAD LIMA to LAD, radial to RCA, and radial to OM versus diagonal(?) 2000 in [facility] PCI unknown artery 2005 PCI radial to RCA graft 2015 Hypertension Hyperlipidemia LP(a) 70.9 Aortic atherosclerosis Chest CT 2023 CKD stage II Lung cancer Status post resection Cardiomyopathy EF 40 to 45% without valve problems TTE 2022 EF 50 to 55% without valve problems TTE July 2025 Diastolic dysfunction G1 DD, no significant valve problems, EF  40 to 45% TTE 2022 G1 DD, no significant valve problems, EF 50 to 55% TTE July 2025 Prediabetes Hemoglobin A1c 6.3 in March 2025 Tobacco abuse Abdominal ultrasound without AAA 2023 BMI 32  March 2025:  Patient consents to use of AI scribe. The patient returns for routine follow-up.  He was last seen in October 2024 and he was doing well with a well-controlled blood pressure.  His  medications were continued without changes.  A lipid panel was checked and demonstrated LDL of 68.  He reports a recent episode of lightheadedness lasting about two weeks, which he associates with high potassium levels from multivitamin use. After discontinuing the multivitamins, the symptoms have mostly resolved, though he occasionally feels 'a little wobbly' upon standing. His blood pressure has been lower than usual, with systolic readings in the mid-90s to 110s and diastolic readings consistently between 60-65. No chest pain, breathing difficulties, leg pain when walking, or symptoms of stroke such as difficulty moving arms or legs or vision changes.  He is currently on medications for high blood pressure, high cholesterol, and heart muscle weakness, including metoprolol  and Entresto . He is on Plavix  and was previously on aspirin , which has been stopped. He recalls a change in his metoprolol  dosage from 25 mg to 50 mg in 2022, but he is unsure of his current dose and needs to verify his medication dosage at home.  He uses a vape pen and has a history of tobacco use. He mentions weight gain over the winter due to decreased physical activity, partly because of hip pain that limits his walking. He has been informed of being 'close' to diabetes, which prompted him to start walking, but hip pain has hindered his efforts.  Plan: Decrease Toprol  to 25 mg.  Check LP(a).  Start Jardiance  10 mg.  Obtain echocardiogram.  December 2025:  Patient consents to use of AI scribe. In the interim the patient's LP(a) was not elevated.  His LDL in October was at goal at 51.  His echocardiogram demonstrated improved ejection fraction of 50 to 55%.  His hemoglobin A1c was consistent with prediabetes.  He has been monitoring his blood pressure for the past eight months and notes that his readings have been lower since his Toprol  dosage was reduced to 25 mg from 50 mg. He denies feeling lightheaded, except occasionally when  standing up after sitting for a while.  He has a history of coronary artery disease, having undergone bypass surgery approximately 25 years ago and stent placements in 2005 and 2015. No chest pain or breathing difficulties are reported, although he notes occasional 'catching of breath' when lying down at night, which resolves within a minute or two. No nocturnal dyspnea is reported.  He has a history of smoking and currently uses a vape. He denies any leg pain when walking but mentions hip pain, which he attributes to a longstanding back issue since the age of 56. He has not experienced any symptoms suggestive of a stroke.  His current medications include Toprol  25 mg and Entresto  49/51 mg. He has had issues with medication dosages being incorrectly prescribed in the past, leading to a lack of confidence in certain healthcare providers.  No chest pain, shortness of breath, or leg pain on exertion as reported by the patient. He reports good control of his cholesterol levels as of August.         Current Medications: Current Meds  Medication Sig   clopidogrel  (PLAVIX ) 75 MG tablet Take 1 tablet (  75 mg total) by mouth daily.   empagliflozin  (JARDIANCE ) 10 MG TABS tablet Take 1 tablet (10 mg total) by mouth daily before breakfast.   ezetimibe  (ZETIA ) 10 MG tablet *NEED OFFICE VISIT FOR REFFILL* TAKE 1 TABLET BY MOUTH EVERY DAY   fenofibrate  (TRICOR ) 145 MG tablet Take 1 tablet (145 mg total) by mouth daily.   metoprolol  succinate (TOPROL  XL) 25 MG 24 hr tablet Take 1 tablet (25 mg total) by mouth daily.   nitroGLYCERIN  (NITROSTAT ) 0.4 MG SL tablet Place 1 tablet (0.4 mg total) under the tongue every 5 (five) minutes as needed for chest pain.   rosuvastatin  (CRESTOR ) 40 MG tablet TAKE 1 TABLET BY MOUTH EVERY DAY   sacubitril-valsartan (ENTRESTO ) 49-51 MG TAKE 1 TABLET BY MOUTH TWICE A DAY     Review of Systems:   Please see the history of present illness.    All other systems reviewed and are  negative.     EKGs/Labs/Other Test Reviewed:   EKG: October 2024 EKG normal sinus rhythm with nonspecific ST and T wave changes  EKG Interpretation Date/Time:  Thursday April 06 2024 10:03:29 EST Ventricular Rate:  58 PR Interval:  160 QRS Duration:  90 QT Interval:  438 QTC Calculation: 429 R Axis:   254  Text Interpretation: Sinus bradycardia Possible Lateral infarct , age undetermined Inferior infarct , age undetermined When compared with ECG of 20-Jan-2023 10:00, No significant change was found Confirmed by Wendel Haws ([id number]) on 04/06/2024 10:05:32 AM         Risk Assessment/Calculations:          Physical Exam:   VS:  BP 104/72   Pulse (!) 58   Ht 5' 7 (1.702 m)   Wt 212 lb 9.6 oz (96.4 kg)   SpO2 96%   BMI 33.30 kg/m        Wt Readings from Last 3 Encounters:  04/06/24 212 lb 9.6 oz (96.4 kg)  11/18/23 212 lb 6 oz (96.3 kg)  10/19/23 210 lb (95.3 kg)      GENERAL:  No apparent distress, AOx3 HEENT:  No carotid bruits, +2 carotid impulses, no scleral icterus CAR: RRR no murmurs, gallops, rubs, or thrills RES:  Clear to auscultation bilaterally ABD:  Soft, nontender, nondistended, positive bowel sounds x 4 VASC:  +2 radial pulses, +2 carotid pulses NEURO:  CN 2-12 grossly intact; motor and sensory grossly intact PSYCH:  No active depression or anxiety EXT:  No edema, ecchymosis, or cyanosis  Signed, Jennings Corado K Skylyn Slezak, MD  04/06/2024 10:30 AM    [facility] [address] [place], [place], [place]  [address] Phone: [phone]; Fax: [phone]   Note:  This document was prepared using Dragon voice recognition software and may include unintentional dictation errors.

## 2024-04-06 ENCOUNTER — Ambulatory Visit: Attending: Internal Medicine | Admitting: Internal Medicine

## 2024-04-06 ENCOUNTER — Encounter: Payer: Self-pay | Admitting: Internal Medicine

## 2024-04-06 VITALS — BP 104/72 | HR 58 | Ht 67.0 in | Wt 212.6 lb

## 2024-04-06 DIAGNOSIS — Z6832 Body mass index (BMI) 32.0-32.9, adult: Secondary | ICD-10-CM | POA: Diagnosis not present

## 2024-04-06 DIAGNOSIS — Z720 Tobacco use: Secondary | ICD-10-CM | POA: Diagnosis not present

## 2024-04-06 DIAGNOSIS — Z951 Presence of aortocoronary bypass graft: Secondary | ICD-10-CM

## 2024-04-06 DIAGNOSIS — R7303 Prediabetes: Secondary | ICD-10-CM

## 2024-04-06 DIAGNOSIS — I5189 Other ill-defined heart diseases: Secondary | ICD-10-CM

## 2024-04-06 DIAGNOSIS — I10 Essential (primary) hypertension: Secondary | ICD-10-CM

## 2024-04-06 DIAGNOSIS — I255 Ischemic cardiomyopathy: Secondary | ICD-10-CM

## 2024-04-06 DIAGNOSIS — N182 Chronic kidney disease, stage 2 (mild): Secondary | ICD-10-CM

## 2024-04-06 DIAGNOSIS — I700 Atherosclerosis of aorta: Secondary | ICD-10-CM

## 2024-04-06 DIAGNOSIS — E785 Hyperlipidemia, unspecified: Secondary | ICD-10-CM | POA: Diagnosis not present

## 2024-04-06 NOTE — Patient Instructions (Signed)
 Medication Instructions:  No medication changes were made at this visit. Continue current regimen.   *If you need a refill on your cardiac medications before your next appointment, please call your pharmacy*  Lab Work: To be completed today: hs-CRP  If you have labs (blood work) drawn today and your tests are completely normal, you will receive your results only by: MyChart Message (if you have MyChart) OR A paper copy in the mail If you have any lab test that is abnormal or we need to change your treatment, we will call you to review the results.  Testing/Procedures: Vascuscreen- Carotid Duplex Your physician has requested that you have a carotid duplex. This test is an ultrasound of the carotid arteries in your neck. It looks at blood flow through these arteries that supply the brain with blood. Allow one hour for this exam. There are no restrictions or special instructions.  Follow-Up: At [facility], you and your health needs are our priority.  As part of our continuing mission to provide you with exceptional heart care, our providers are all part of one team.  This team includes your primary Cardiologist (physician) and Advanced Practice Providers or APPs (Physician Assistants and Nurse Practitioners) who all work together to provide you with the care you need, when you need it.  Your next appointment:   6 month(s)  Provider:   Arun Thukkani, MD

## 2024-04-07 ENCOUNTER — Ambulatory Visit: Payer: Self-pay | Admitting: Internal Medicine

## 2024-04-07 LAB — HIGH SENSITIVITY CRP: CRP, High Sensitivity: 1.38 mg/L (ref 0.00–3.00)

## 2024-04-17 ENCOUNTER — Ambulatory Visit (HOSPITAL_COMMUNITY)
Admission: RE | Admit: 2024-04-17 | Discharge: 2024-04-17 | Disposition: A | Discharge status: Home / Self Care | Source: Ambulatory Visit | Attending: Internal Medicine | Admitting: Internal Medicine

## 2024-04-17 DIAGNOSIS — Z720 Tobacco use: Secondary | ICD-10-CM

## 2024-04-17 DIAGNOSIS — I5189 Other ill-defined heart diseases: Secondary | ICD-10-CM

## 2024-04-17 DIAGNOSIS — R7303 Prediabetes: Secondary | ICD-10-CM

## 2024-04-17 DIAGNOSIS — Z951 Presence of aortocoronary bypass graft: Secondary | ICD-10-CM

## 2024-04-17 DIAGNOSIS — I700 Atherosclerosis of aorta: Secondary | ICD-10-CM

## 2024-04-17 DIAGNOSIS — E785 Hyperlipidemia, unspecified: Secondary | ICD-10-CM

## 2024-04-17 DIAGNOSIS — N182 Chronic kidney disease, stage 2 (mild): Secondary | ICD-10-CM

## 2024-04-17 DIAGNOSIS — I255 Ischemic cardiomyopathy: Secondary | ICD-10-CM

## 2024-04-17 DIAGNOSIS — I10 Essential (primary) hypertension: Secondary | ICD-10-CM

## 2024-04-17 DIAGNOSIS — Z6832 Body mass index (BMI) 32.0-32.9, adult: Secondary | ICD-10-CM

## 2024-05-05 ENCOUNTER — Ambulatory Visit: Admitting: Emergency Medicine

## 2024-09-13 ENCOUNTER — Inpatient Hospital Stay

## 2024-09-20 ENCOUNTER — Inpatient Hospital Stay: Admitting: Internal Medicine
